# Patient Record
Sex: Male | Born: 1955
Health system: Southern US, Community
[De-identification: ages and names within clinical notes are randomized; demographics above are authoritative.]

## PROBLEM LIST (undated history)

## (undated) DIAGNOSIS — G4730 Sleep apnea, unspecified: Secondary | ICD-10-CM

## (undated) DIAGNOSIS — I10 Essential (primary) hypertension: Secondary | ICD-10-CM

## (undated) DIAGNOSIS — Z951 Presence of aortocoronary bypass graft: Secondary | ICD-10-CM

## (undated) DIAGNOSIS — E669 Obesity, unspecified: Secondary | ICD-10-CM

## (undated) DIAGNOSIS — N200 Calculus of kidney: Secondary | ICD-10-CM

## (undated) DIAGNOSIS — C801 Malignant (primary) neoplasm, unspecified: Secondary | ICD-10-CM

## (undated) DIAGNOSIS — I2510 Atherosclerotic heart disease of native coronary artery without angina pectoris: Secondary | ICD-10-CM

## (undated) DIAGNOSIS — M109 Gout, unspecified: Secondary | ICD-10-CM

## (undated) DIAGNOSIS — I499 Cardiac arrhythmia, unspecified: Secondary | ICD-10-CM

## (undated) DIAGNOSIS — Z87442 Personal history of urinary calculi: Secondary | ICD-10-CM

## (undated) DIAGNOSIS — I480 Paroxysmal atrial fibrillation: Secondary | ICD-10-CM

## (undated) DIAGNOSIS — M1990 Unspecified osteoarthritis, unspecified site: Secondary | ICD-10-CM

## (undated) HISTORY — DX: Calculus of kidney: N20.0

## (undated) HISTORY — DX: Obesity, unspecified: E66.9

## (undated) HISTORY — PX: LESION EXCISION: SHX5167

## (undated) HISTORY — PX: EYE SURGERY: SHX253

## (undated) HISTORY — PX: TONSILLECTOMY: SUR1361

## (undated) HISTORY — PX: KIDNEY STONE SURGERY: SHX686

---

## 2004-11-03 ENCOUNTER — Ambulatory Visit (HOSPITAL_BASED_OUTPATIENT_CLINIC_OR_DEPARTMENT_OTHER): Admission: RE | Admit: 2004-11-03 | Discharge: 2004-11-03 | Payer: Self-pay | Admitting: Family Medicine

## 2004-11-06 ENCOUNTER — Ambulatory Visit: Payer: Self-pay | Admitting: Internal Medicine

## 2011-12-14 ENCOUNTER — Other Ambulatory Visit: Payer: Self-pay | Admitting: Internal Medicine

## 2011-12-14 DIAGNOSIS — N200 Calculus of kidney: Secondary | ICD-10-CM

## 2011-12-15 ENCOUNTER — Ambulatory Visit
Admission: RE | Admit: 2011-12-15 | Discharge: 2011-12-15 | Disposition: A | Discharge status: Home / Self Care | Payer: BC Managed Care – PPO | Source: Ambulatory Visit | Attending: Internal Medicine | Admitting: Internal Medicine

## 2011-12-15 DIAGNOSIS — N200 Calculus of kidney: Secondary | ICD-10-CM

## 2013-01-09 DIAGNOSIS — I2510 Atherosclerotic heart disease of native coronary artery without angina pectoris: Secondary | ICD-10-CM

## 2013-01-09 HISTORY — DX: Atherosclerotic heart disease of native coronary artery without angina pectoris: I25.10

## 2013-04-15 ENCOUNTER — Other Ambulatory Visit: Payer: Self-pay | Admitting: Cardiology

## 2013-04-15 ENCOUNTER — Encounter: Payer: Self-pay | Admitting: Cardiology

## 2013-04-15 ENCOUNTER — Ambulatory Visit
Admission: RE | Admit: 2013-04-15 | Discharge: 2013-04-15 | Disposition: A | Discharge status: Home / Self Care | Payer: BC Managed Care – PPO | Source: Ambulatory Visit | Attending: Cardiology | Admitting: Cardiology

## 2013-04-15 DIAGNOSIS — E669 Obesity, unspecified: Secondary | ICD-10-CM

## 2013-04-15 DIAGNOSIS — R079 Chest pain, unspecified: Secondary | ICD-10-CM

## 2013-04-15 DIAGNOSIS — I2510 Atherosclerotic heart disease of native coronary artery without angina pectoris: Secondary | ICD-10-CM

## 2013-04-15 DIAGNOSIS — R0789 Other chest pain: Secondary | ICD-10-CM

## 2013-04-15 DIAGNOSIS — I10 Essential (primary) hypertension: Secondary | ICD-10-CM

## 2013-04-15 NOTE — H&P (Signed)
Gary [name] F  Date of visit:  04/15/2013 DOB:  [birth year]    Age:  58 yrs. Medical record number:  [id number]     Account number:  [id number] Primary Care Provider: Merrilee Seashore ____________________________ CURRENT DIAGNOSES  1. Chest Pain  2. Abnormal Exercise Stress Test  3. Obesity(BMI30-40)  4. Hypertension,Essential (Benign) ____________________________ ALLERGIES  IVP Dye, Iodine Containing, Hives and/or rash ____________________________ MEDICATIONS  1. hydrocodone 5 mg-acetaminophen 325 mg tablet, PRN  2. Vitamin C 1,000 mg tablet, 1 p.o. daily  3. multivitamin tablet, 1 p.o. daily  4. Aleve 220 mg tablet, PRN  5. allopurinol 100 mg tablet, 1 p.o. daily  6. Flomax 0.4 mg capsule, 1 p.o. daily  7. Avapro 150 mg tablet, 1 p.o. daily  8. coenzyme q-10 (ubiquinol) 200 mg capsule, 1 p.o. daily  9. magnesium 250 mg tablet, 1 p.o. daily  10. promethazine 6.25 mg/5 mL syrup, QHS  11. fluticasone 50 mcg/actuation nasal spray,suspension, qd  12. prednisone 20 mg tablet, take 3 po at 6 pm and the 3 po prior to coming to the hospital  13. metoprolol tartrate 25 mg tablet, BID  14. nitroglycerin 0.4 mg sublingual tablet, Take as directed ____________________________ CHIEF COMPLAINTS  Chest pressure with walking in january ____________________________ HISTORY OF PRESENT ILLNESS This 59 year old male is seen for evaluation of chest pressure. While at a trade show meeting he experienced the onset of midsternal discomfort associated with shortness of breath after exertion that occurred on several occasions while he was walking a pills or long distances. He would have to stop with relief of symptoms. He had a similar episode after he returned to town and later then developed "the flu" described as upper respiratory congestion, malaise and fatigue. He was seen recently by his primary physician who wanted him to have an exercise treadmill. He said was seen at the office today and walked  less than 6 minutes and the test was stopped due to severe dyspnea. He had a blunted heart rate response to exercise and a blunted blood pressure response to exercise and had 1-2 mm of ST depression in inferolateral leads that resolved in recovery. He did not have clinical anginal the treadmill today but had a markedly abnormal response to exercise. He was due to take a trip to the [facility] and after further consideration in view of the markedly abnormal exercise test and low-level positive test elected to proceed with catheterization at an earlier date. He does not have symptoms at rest. He has been obese for several years. He denies PND, orthopnea or edema. His blood pressure has been under fair control. He does have a history of allergy to contrast when he had a CT scan several years ago that evidently was self-limited and resolved with treatment. ____________________________ PAST HISTORY  Past Medical Illnesses:  hypertension, non alcololic fatty liver disease, gout, kidney stones, obesity;  Cardiovascular Illnesses:  no previous history of cardiac disease.;  Surgical Procedures:  kidney stone removal;  Cardiology Procedures-Invasive:  no history of prior cardiac procedures;  Cardiology Procedures-Noninvasive:  no previous non-invasive procedures;  LVEF not documented,   ____________________________ CARDIO-PULMONARY TEST DATES EKG Date:  04/15/2013;   ____________________________ FAMILY HISTORY Brother -- Brother alive and well Brother -- Brother dead, Malignant melanoma Father -- Father dead, Myocardial infarction at less than 17, Carcinoma of the pancreas Mother -- Mother alive and well ____________________________ SOCIAL HISTORY Alcohol Use:  occasionally;  Smoking:  used to smoke but quit, 25 pack year history;  Diet:  regular diet;  Lifestyle:  married;  Exercise:  no regular exercise;  Occupation:  Press photographer;  Residence:  lives with wife;   ____________________________ REVIEW OF  SYSTEMS General:  obesity  Integumentary:no rashes or new skin lesions. Eyes: dry eyes Ears, Nose, Throat, Mouth:  denies any hearing loss, epistaxis, hoarseness or difficulty speaking. Respiratory: dyspnea with exertion Cardiovascular:  please review HPI Abdominal: denies dyspepsia, GI bleeding, constipation, or diarrhea Genitourinary-Male: no dysuria, urgency, frequency, or nocturia  Musculoskeletal:  denies arthritis, venous insufficiency, or muscle weakness Neurological:  denies headaches, stroke, or TIA Psychiatric:  denies depession or anxiety  ____________________________ PHYSICAL EXAMINATION VITAL SIGNS  Blood Pressure:  118/74 Supine, Left arm, regular cuff  , 110/70 Standing, Left arm and regular cuff   Pulse:  76/min. Weight:  287.00 lbs. Height:  74"BMI: 37  Constitutional:  pleasant white male in no acute distress, moderately obese Skin:  warm and dry to touch, no apparent skin lesions, or masses noted. Head:  normocephalic, normal hair pattern, no masses or tenderness Eyes:  EOMS Intact, PERRLA, C and S clear, Funduscopic exam not done. ENT:  ears, nose and throat reveal no gross abnormalities.  Dentition good. Neck:  supple, without massess. No JVD, thyromegaly or carotid bruits. Carotid upstroke normal. Chest:  normal symmetry, clear to auscultation. Cardiac:  regular rhythm, normal S1 and S2, No S3 or S4, no murmurs, gallops or rubs detected. Abdomen:  abdomen soft,non-tender, no masses, no hepatospenomegaly, or aneurysm noted Peripheral Pulses:  the femoral,dorsalis pedis, and posterior tibial pulses are full and equal bilaterally with no bruits auscultated. Extremities & Back:  no deformities, clubbing, cyanosis, erythema or edema observed. Normal muscle strength and tone. Neurological:  no gross motor or sensory deficits noted, affect appropriate, oriented x3. ____________________________ IMPRESSIONS/PLAN  1. Chest pressure suggestive of angina with a markedly abnormal  stress test at a low level of exercise with a blunted heart rate response and blood pressure and as well as ST depression 2. Hypertension 3. Family history of cardiac disease 4. Obesity  Recommendations:  Cardiac catheterization was discussed with the patient including risks of myocardial infarction, death, stroke, bleeding, arrhythmia, dye allergy, or renal insufficiency. He understands and is willing to proceed. Possibility of percutaneous intervention at the same setting was also discussed with the patient including risks. He does have a previous allergy to contrast and will be premedicated with steroids, Benadryl and H2 blockers. He is agreeable and wishes to proceed. He was advised to take aspirin 81 mg daily and started on metoprolol 25 mg twice daily as well as given a prescription for nitroglycerin. ____________________________ TODAYS ORDERS  1. Comprehensive Metabolic Panel: Today  2. Complete Blood Count: Today  3. Draw PT/INR: Today  4. PTT: Today  5. Chest X-ray PA/Lat: today                       ____________________________ Cardiology Physician:  Kerry Hough MD [facility]

## 2013-04-16 ENCOUNTER — Encounter (HOSPITAL_COMMUNITY)
Admission: RE | Disposition: A | Discharge status: Home / Self Care | Payer: Self-pay | Source: Ambulatory Visit | Attending: Cardiothoracic Surgery

## 2013-04-16 ENCOUNTER — Ambulatory Visit (HOSPITAL_COMMUNITY)
Admission: RE | Admit: 2013-04-16 | Discharge: 2013-04-16 | Disposition: A | Discharge status: Home / Self Care | Payer: BC Managed Care – PPO | Source: Ambulatory Visit | Attending: Cardiology | Admitting: Cardiology

## 2013-04-16 ENCOUNTER — Other Ambulatory Visit: Payer: Self-pay | Admitting: *Deleted

## 2013-04-16 ENCOUNTER — Encounter (HOSPITAL_COMMUNITY)
Admission: RE | Disposition: A | Discharge status: Home / Self Care | Payer: Self-pay | Source: Ambulatory Visit | Attending: Cardiology

## 2013-04-16 ENCOUNTER — Inpatient Hospital Stay (HOSPITAL_COMMUNITY)
Admission: RE | Admit: 2013-04-16 | Discharge: 2013-04-24 | DRG: 234 | Disposition: A | Discharge status: Home / Self Care | Payer: BC Managed Care – PPO | Source: Ambulatory Visit | Attending: Cardiothoracic Surgery | Admitting: Cardiothoracic Surgery

## 2013-04-16 ENCOUNTER — Other Ambulatory Visit: Payer: Self-pay | Admitting: Cardiology

## 2013-04-16 ENCOUNTER — Encounter (HOSPITAL_COMMUNITY): Payer: Self-pay | Admitting: Cardiology

## 2013-04-16 DIAGNOSIS — I2510 Atherosclerotic heart disease of native coronary artery without angina pectoris: Secondary | ICD-10-CM

## 2013-04-16 DIAGNOSIS — Z951 Presence of aortocoronary bypass graft: Secondary | ICD-10-CM

## 2013-04-16 DIAGNOSIS — R9439 Abnormal result of other cardiovascular function study: Secondary | ICD-10-CM | POA: Diagnosis present

## 2013-04-16 DIAGNOSIS — R9430 Abnormal result of cardiovascular function study, unspecified: Secondary | ICD-10-CM

## 2013-04-16 DIAGNOSIS — R079 Chest pain, unspecified: Secondary | ICD-10-CM

## 2013-04-16 DIAGNOSIS — N200 Calculus of kidney: Secondary | ICD-10-CM | POA: Insufficient documentation

## 2013-04-16 DIAGNOSIS — E669 Obesity, unspecified: Secondary | ICD-10-CM

## 2013-04-16 DIAGNOSIS — K7689 Other specified diseases of liver: Secondary | ICD-10-CM | POA: Diagnosis present

## 2013-04-16 DIAGNOSIS — Z8249 Family history of ischemic heart disease and other diseases of the circulatory system: Secondary | ICD-10-CM

## 2013-04-16 DIAGNOSIS — Z01810 Encounter for preprocedural cardiovascular examination: Secondary | ICD-10-CM

## 2013-04-16 DIAGNOSIS — M109 Gout, unspecified: Secondary | ICD-10-CM | POA: Insufficient documentation

## 2013-04-16 DIAGNOSIS — D62 Acute posthemorrhagic anemia: Secondary | ICD-10-CM | POA: Diagnosis not present

## 2013-04-16 DIAGNOSIS — I208 Other forms of angina pectoris: Secondary | ICD-10-CM

## 2013-04-16 DIAGNOSIS — Z79899 Other long term (current) drug therapy: Secondary | ICD-10-CM

## 2013-04-16 DIAGNOSIS — Z808 Family history of malignant neoplasm of other organs or systems: Secondary | ICD-10-CM

## 2013-04-16 DIAGNOSIS — I4891 Unspecified atrial fibrillation: Secondary | ICD-10-CM | POA: Diagnosis not present

## 2013-04-16 DIAGNOSIS — Z87891 Personal history of nicotine dependence: Secondary | ICD-10-CM

## 2013-04-16 DIAGNOSIS — I10 Essential (primary) hypertension: Secondary | ICD-10-CM | POA: Diagnosis present

## 2013-04-16 DIAGNOSIS — I200 Unstable angina: Secondary | ICD-10-CM | POA: Diagnosis present

## 2013-04-16 DIAGNOSIS — E8779 Other fluid overload: Secondary | ICD-10-CM | POA: Diagnosis not present

## 2013-04-16 DIAGNOSIS — Z800 Family history of malignant neoplasm of digestive organs: Secondary | ICD-10-CM

## 2013-04-16 DIAGNOSIS — Z7982 Long term (current) use of aspirin: Secondary | ICD-10-CM

## 2013-04-16 DIAGNOSIS — Z91041 Radiographic dye allergy status: Secondary | ICD-10-CM

## 2013-04-16 DIAGNOSIS — Z6837 Body mass index (BMI) 37.0-37.9, adult: Secondary | ICD-10-CM

## 2013-04-16 DIAGNOSIS — I480 Paroxysmal atrial fibrillation: Secondary | ICD-10-CM | POA: Diagnosis not present

## 2013-04-16 DIAGNOSIS — I2582 Chronic total occlusion of coronary artery: Secondary | ICD-10-CM | POA: Diagnosis present

## 2013-04-16 HISTORY — DX: Essential (primary) hypertension: I10

## 2013-04-16 HISTORY — DX: Gout, unspecified: M10.9

## 2013-04-16 HISTORY — PX: LEFT AND RIGHT HEART CATHETERIZATION WITH CORONARY ANGIOGRAM: SHX5449

## 2013-04-16 HISTORY — DX: Atherosclerotic heart disease of native coronary artery without angina pectoris: I25.10

## 2013-04-16 LAB — SPIROMETRY WITH GRAPH
FEF 25-75 Post: 4.16 L/sec
FEF 25-75 Pre: 3.1 L/sec
FEF2575-%Change-Post: 34 %
FEF2575-%Pred-Post: 117 %
FEF2575-%Pred-Pre: 87 %
FEV1-%Change-Post: 7 %
FEV1-%Pred-Post: 94 %
FEV1-%Pred-Pre: 87 %
FEV1-Post: 4.02 L
FEV1-Pre: 3.75 L
FEV1FVC-%Change-Post: 8 %
FEV1FVC-%Pred-Pre: 100 %
FEV6-%Change-Post: 0 %
FEV6-%Pred-Post: 90 %
FEV6-%Pred-Pre: 90 %
FEV6-Post: 4.85 L
FEV6-Pre: 4.87 L
FEV6FVC-%Change-Post: 0 %
FEV6FVC-%Pred-Post: 104 %
FEV6FVC-%Pred-Pre: 103 %
FVC-%Change-Post: -1 %
FVC-%Pred-Post: 86 %
FVC-%Pred-Pre: 88 %
FVC-Post: 4.85 L
FVC-Pre: 4.91 L
Post FEV1/FVC ratio: 83 %
Post FEV6/FVC ratio: 100 %
Pre FEV1/FVC ratio: 76 %
Pre FEV6/FVC Ratio: 99 %

## 2013-04-16 LAB — CBC
HCT: 41.5 % (ref 39.0–52.0)
HEMOGLOBIN: 14.5 g/dL (ref 13.0–17.0)
MCH: 33 pg (ref 26.0–34.0)
MCHC: 34.9 g/dL (ref 30.0–36.0)
MCV: 94.3 fL (ref 78.0–100.0)
PLATELETS: 296 10*3/uL (ref 150–400)
RBC: 4.4 MIL/uL (ref 4.22–5.81)
RDW: 12.8 % (ref 11.5–15.5)
WBC: 15.5 10*3/uL — ABNORMAL HIGH (ref 4.0–10.5)

## 2013-04-16 LAB — CREATININE, SERUM
Creatinine, Ser: 1.06 mg/dL (ref 0.50–1.35)
GFR calc Af Amer: 89 mL/min — ABNORMAL LOW (ref >90)
GFR calc non Af Amer: 77 mL/min — ABNORMAL LOW (ref >90)

## 2013-04-16 SURGERY — LEFT HEART CATHETERIZATION WITH CORONARY ANGIOGRAM
Anesthesia: LOCAL

## 2013-04-16 SURGERY — LEFT AND RIGHT HEART CATHETERIZATION WITH CORONARY ANGIOGRAM
Anesthesia: LOCAL

## 2013-04-16 MED ORDER — SODIUM CHLORIDE 0.9 % IV SOLN
1.0000 mL/kg/h | INTRAVENOUS | Status: AC
  Administered 2013-04-16: 1 mL/kg/h via INTRAVENOUS

## 2013-04-16 MED ORDER — ASPIRIN EC 81 MG PO TBEC
81.0000 mg | DELAYED_RELEASE_TABLET | Freq: Every day | ORAL | Status: DC
  Administered 2013-04-17: 81 mg via ORAL
  Filled 2013-04-16 (×3): qty 1

## 2013-04-16 MED ORDER — MIDAZOLAM HCL 2 MG/2ML IJ SOLN
INTRAMUSCULAR | Status: AC
  Filled 2013-04-16: qty 2

## 2013-04-16 MED ORDER — HEPARIN (PORCINE) IN NACL 2-0.9 UNIT/ML-% IJ SOLN
INTRAMUSCULAR | Status: AC
  Filled 2013-04-16: qty 1000

## 2013-04-16 MED ORDER — IRBESARTAN 150 MG PO TABS
150.0000 mg | ORAL_TABLET | Freq: Every day | ORAL | Status: DC
  Administered 2013-04-17: 150 mg via ORAL
  Filled 2013-04-16 (×3): qty 1

## 2013-04-16 MED ORDER — SODIUM CHLORIDE 0.9 % IV SOLN: 250.0000 mL | INTRAVENOUS | Status: DC | PRN

## 2013-04-16 MED ORDER — ASPIRIN 81 MG PO CHEW
81.0000 mg | CHEWABLE_TABLET | ORAL | Status: DC
  Filled 2013-04-16: qty 1

## 2013-04-16 MED ORDER — DIPHENHYDRAMINE HCL 50 MG/ML IJ SOLN
25.0000 mg | INTRAMUSCULAR | Status: AC
  Administered 2013-04-16: 25 mg via INTRAVENOUS

## 2013-04-16 MED ORDER — ENOXAPARIN SODIUM 40 MG/0.4ML ~~LOC~~ SOLN
40.0000 mg | SUBCUTANEOUS | Status: DC
  Administered 2013-04-17: 40 mg via SUBCUTANEOUS
  Filled 2013-04-16 (×3): qty 0.4

## 2013-04-16 MED ORDER — DIPHENHYDRAMINE HCL 50 MG/ML IJ SOLN
INTRAMUSCULAR | Status: AC
  Administered 2013-04-16: 25 mg via INTRAVENOUS
  Filled 2013-04-16: qty 1

## 2013-04-16 MED ORDER — NITROGLYCERIN 0.2 MG/ML ON CALL CATH LAB
INTRAVENOUS | Status: AC
  Filled 2013-04-16: qty 1

## 2013-04-16 MED ORDER — DIPHENHYDRAMINE HCL 50 MG/ML IJ SOLN
25.0000 mg | INTRAMUSCULAR | Status: AC
  Administered 2013-04-16: 25 mg via INTRAVENOUS
  Filled 2013-04-16: qty 1

## 2013-04-16 MED ORDER — ALLOPURINOL 300 MG PO TABS
400.0000 mg | ORAL_TABLET | Freq: Every day | ORAL | Status: DC
  Administered 2013-04-17 – 2013-04-24 (×7): 400 mg via ORAL
  Filled 2013-04-16 (×9): qty 1

## 2013-04-16 MED ORDER — SODIUM CHLORIDE 0.9 % IJ SOLN
3.0000 mL | Freq: Two times a day (BID) | INTRAMUSCULAR | Status: DC
  Administered 2013-04-16 – 2013-04-17 (×3): 3 mL via INTRAVENOUS

## 2013-04-16 MED ORDER — FAMOTIDINE IN NACL 20-0.9 MG/50ML-% IV SOLN
20.0000 mg | INTRAVENOUS | Status: AC
  Administered 2013-04-16: 20 mg via INTRAVENOUS
  Filled 2013-04-16 (×3): qty 50

## 2013-04-16 MED ORDER — SODIUM CHLORIDE 0.9 % IV SOLN
INTRAVENOUS | Status: DC
Start: 2013-04-16 — End: 2013-04-18
  Administered 2013-04-16: 08:00:00 via INTRAVENOUS

## 2013-04-16 MED ORDER — SODIUM CHLORIDE 0.9 % IV SOLN
INTRAVENOUS | Status: DC
  Administered 2013-04-16: 08:00:00 via INTRAVENOUS

## 2013-04-16 MED ORDER — HEPARIN (PORCINE) IN NACL 2-0.9 UNIT/ML-% IJ SOLN
INTRAMUSCULAR | Status: AC
  Filled 2013-04-16: qty 500

## 2013-04-16 MED ORDER — FAMOTIDINE 20 MG PO TABS
20.0000 mg | ORAL_TABLET | ORAL | Status: AC
  Administered 2013-04-16: 20 mg via ORAL

## 2013-04-16 MED ORDER — LIDOCAINE HCL (PF) 1 % IJ SOLN
INTRAMUSCULAR | Status: AC
  Filled 2013-04-16: qty 30

## 2013-04-16 MED ORDER — TAMSULOSIN HCL 0.4 MG PO CAPS
0.4000 mg | ORAL_CAPSULE | Freq: Every day | ORAL | Status: DC
  Administered 2013-04-17 – 2013-04-24 (×7): 0.4 mg via ORAL
  Filled 2013-04-16 (×9): qty 1

## 2013-04-16 MED ORDER — SODIUM CHLORIDE 0.9 % IJ SOLN: 3.0000 mL | INTRAMUSCULAR | Status: DC | PRN

## 2013-04-16 MED ORDER — METOPROLOL TARTRATE 25 MG PO TABS
25.0000 mg | ORAL_TABLET | Freq: Two times a day (BID) | ORAL | Status: DC
  Administered 2013-04-16 – 2013-04-17 (×3): 25 mg via ORAL
  Filled 2013-04-16 (×7): qty 1

## 2013-04-16 MED ORDER — DIAZEPAM 5 MG PO TABS
10.0000 mg | ORAL_TABLET | ORAL | Status: AC
  Administered 2013-04-16: 10 mg via ORAL
  Filled 2013-04-16: qty 2

## 2013-04-16 MED ORDER — ALLOPURINOL 300 MG PO TABS: 400.0000 mg | ORAL_TABLET | Freq: Every day | ORAL | Status: DC

## 2013-04-16 MED ORDER — ASPIRIN 81 MG PO CHEW: 81.0000 mg | CHEWABLE_TABLET | ORAL | Status: DC

## 2013-04-16 MED ORDER — VITAMIN C 500 MG PO TABS
2000.0000 mg | ORAL_TABLET | Freq: Every day | ORAL | Status: DC
  Administered 2013-04-17 – 2013-04-24 (×7): 2000 mg via ORAL
  Filled 2013-04-16 (×9): qty 4

## 2013-04-16 MED ORDER — DIAZEPAM 5 MG PO TABS
10.0000 mg | ORAL_TABLET | ORAL | Status: AC
Start: 2013-04-16 — End: 2013-04-16
  Administered 2013-04-16: 10 mg via ORAL

## 2013-04-16 NOTE — CV Procedure (Addendum)
Cardiac Catheterization Report   Gary [name]    58 y.o.  male  DOB: 28-Jun-1955  MRN: [id number]  04/16/2013    PROCEDURE:  Left heart catheterization with selective coronary angiography, left ventriculogram.  INDICATIONS:  Angina pectoris with a high risk treadmill test with failure to raise blood pressure and poor chronotropic response to exercise  The risks, benefits, and details of the procedure were explained to the patient.  The patient verbalized understanding and wanted to proceed.  Informed written consent was obtained.  PROCEDURE TECHNIQUE:  After Xylocaine anesthesia a 21F sheath was placed in the right femoral artery with a single anterior needle wall stick.  Versed 2 mg IV was used for sedation area Left coronary angiography was done using a Judkins L 5 guide catheter.  Right coronary angiography was done using a Judkins R4 guide catheter.  A 30 cc ventriculogram was performed in the 30 degree RAO projection.  Tolerated the procedure well.  Sheath removed in the holding area.   CONTRAST:  Total of 95 cc.  COMPLICATIONS:  None.    HEMODYNAMICS:  Aortic postcontrast 105/63, left ventricle postcontrast 105/5-13 area  There was no gradient between the left ventricle and aorta.    ANGIOGRAPHIC DATA:    CORONARY ARTERIES:   Arise and distribute normally.  Co- dominant. Moderate coronary calcification is noted.  Left main coronary artery: Normal  Left anterior descending: Calcified proximally, first diagonal branch is totally occluded and fills by collaterals from the circumflex. There is a total occlusion of the LAD distally with  bridging collaterals as well as collaterals from the right coronary artery  Circumflex coronary artery: Large codominant vessel. Severe eccentric 99% stenosis in the proximal circumflex followed by an eccentric tubular narrowing involving the first marginal branch which is large. There is then some haziness or  possible thrombus or competitive flow through atrial branch in the midportion. There is also collateral filling of the circumflex from the right coronary artery.  Right coronary artery: Severe stenosis involving a large acute marginal branch that supplies collaterals to the distal LAD and some collateral filling seen of the circumflex also.  LEFT VENTRICULOGRAM:  Performed in the 30 RAO projection.  The aortic and mitral valves are normal. The left ventricle is normal in size with normal wall motion. Estimated ejection fraction is 65%.  IMPRESSIONS:  1. Significant three-vessel coronary atherosclerosis 2. Normal left ventricle.Marland Kitchen  RECOMMENDATION:  Surgical consultation initially. Theoretically he could have PCI of the dominant circumflex and the right coronary artery but if the distal LAD could be bypassed this might be better for CABG.  Kerry Hough MD [facility]

## 2013-04-16 NOTE — Progress Notes (Addendum)
VASCULAR LAB PRELIMINARY  PRELIMINARY  PRELIMINARY  PRELIMINARY  Pre-op Cardiac Surgery  Carotid Findings:  Bilateral:  1-39% ICA stenosis.  Vertebral artery flow is antegrade.      Ralene Cork, RVT 04/16/2013 2:16 PM    Upper Extremity Right Left  Brachial Pressures 102 100  Radial Waveforms triphasic triphasic  Ulnar Waveforms triphasic triphasic  Palmar Arch (Allen's Test) normal normal   Findings:  Normal upper extremity arterial exam    Lower  Extremity Right Left  Dorsalis Pedis    Anterior Tibial    Posterior Tibial    Ankle/Brachial Indices      Findings:  Palpable pedal pulses

## 2013-04-16 NOTE — Interval H&P Note (Signed)
History and Physical Interval Note:  04/16/2013 8:42 AM   Patient seen and examined.  No interval change in history and exam since last note.  Stable for procedure.  Kerry Hough. MD [facility]  04/16/2013

## 2013-04-16 NOTE — H&P (Signed)
  Gary [name], Gary [name]  Date of visit:  04/15/2013 DOB:  07/05/1955    Age:  58 yrs. Medical record number:  [id number]     Account number:  [id number] Primary Care Provider: RAMACHANDRAN, AJITH ____________________________ CURRENT DIAGNOSES  1. Chest Pain  2. Abnormal Exercise Stress Test  3. Obesity(BMI30-40)  4. Hypertension,Essential (Benign) ____________________________ ALLERGIES  IVP Dye, Iodine Containing, Hives and/or rash ____________________________ MEDICATIONS  1. hydrocodone 5 mg-acetaminophen 325 mg tablet, PRN  2. Vitamin C 1,000 mg tablet, 1 p.o. daily  3. multivitamin tablet, 1 p.o. daily  4. Aleve 220 mg tablet, PRN  5. allopurinol 100 mg tablet, 1 p.o. daily  6. Flomax 0.4 mg capsule, 1 p.o. daily  7. Avapro 150 mg tablet, 1 p.o. daily  8. coenzyme q-10 (ubiquinol) 200 mg capsule, 1 p.o. daily  9. magnesium 250 mg tablet, 1 p.o. daily  10. promethazine 6.25 mg/5 mL syrup, QHS  11. fluticasone 50 mcg/actuation nasal spray,suspension, qd  12. prednisone 20 mg tablet, take 3 po at 6 pm and the 3 po prior to coming to the hospital  13. metoprolol tartrate 25 mg tablet, BID  14. nitroglycerin 0.4 mg sublingual tablet, Take as directed ____________________________ CHIEF COMPLAINTS  Chest pressure with walking in january ____________________________ HISTORY OF PRESENT ILLNESS This 58-year-old male is seen for evaluation of chest pressure. While at a trade show meeting he experienced the onset of midsternal discomfort associated with shortness of breath after exertion that occurred on several occasions while he was walking a pills or long distances. He would have to stop with relief of symptoms. He had a similar episode after he returned to town and later then developed "the flu" described as upper respiratory congestion, malaise and fatigue. He was seen recently by his primary physician who wanted him to have an exercise treadmill. He said was seen at the office today and walked  less than 6 minutes and the test was stopped due to severe dyspnea. He had a blunted heart rate response to exercise and a blunted blood pressure response to exercise and had 1-2 mm of ST depression in inferolateral leads that resolved in recovery. He did not have clinical anginal the treadmill today but had a markedly abnormal response to exercise. [place] after further consideration in view of the markedly abnormal exercise test and low-level positive test elected to proceed with catheterization at an earlier date. He does not have symptoms at rest. He has been obese for several years. He denies PND, orthopnea or edema. His blood pressure has been under fair control. He does have a history of allergy to contrast when he had a CT scan several years ago that evidently was self-limited and resolved with treatment. ____________________________ PAST HISTORY  Past Medical Illnesses:  hypertension, non alcololic fatty liver disease, gout, kidney stones, obesity;  Cardiovascular Illnesses:  no previous history of cardiac disease.;  Surgical Procedures:  kidney stone removal;  Cardiology Procedures-Invasive:  no history of prior cardiac procedures;  Cardiology Procedures-Noninvasive:  no previous non-invasive procedures;  LVEF not documented,   ____________________________ CARDIO-PULMONARY TEST DATES EKG Date:  04/15/2013;   ____________________________ FAMILY HISTORY Brother -- Brother alive and well Brother -- Brother dead, Malignant melanoma Father -- Father dead, Myocardial infarction at less than 60, Carcinoma of the pancreas Mother -- Mother alive and well ____________________________ SOCIAL HISTORY Alcohol Use:  occasionally;  Smoking:  used to smoke but quit, 25 pack year history;    Diet:  regular diet;  Lifestyle:  married;  Exercise:  no regular exercise;  Occupation:  sales;  Residence:  lives with wife;   ____________________________ REVIEW OF  SYSTEMS General:  obesity  Integumentary:no rashes or new skin lesions. Eyes: dry eyes Ears, Nose, Throat, Mouth:  denies any hearing loss, epistaxis, hoarseness or difficulty speaking. Respiratory: dyspnea with exertion Cardiovascular:  please review HPI Abdominal: denies dyspepsia, GI bleeding, constipation, or diarrhea Genitourinary-Male: no dysuria, urgency, frequency, or nocturia  Musculoskeletal:  denies arthritis, venous insufficiency, or muscle weakness Neurological:  denies headaches, stroke, or TIA Psychiatric:  denies depession or anxiety  ____________________________ PHYSICAL EXAMINATION VITAL SIGNS  Blood Pressure:  118/74 Supine, Left arm, regular cuff  , 110/70 Standing, Left arm and regular cuff   Pulse:  76/min. Weight:  287.00 lbs. Height:  74"BMI: 37  Constitutional:  pleasant white male in no acute distress, moderately obese Skin:  warm and dry to touch, no apparent skin lesions, or masses noted. Head:  normocephalic, normal hair pattern, no masses or tenderness Eyes:  EOMS Intact, PERRLA, C and S clear, Funduscopic exam not done. ENT:  ears, nose and throat reveal no gross abnormalities.  Dentition good. Neck:  supple, without massess. No JVD, thyromegaly or carotid bruits. Carotid upstroke normal. Chest:  normal symmetry, clear to auscultation. Cardiac:  regular rhythm, normal S1 and S2, No S3 or S4, no murmurs, gallops or rubs detected. Abdomen:  abdomen soft,non-tender, no masses, no hepatospenomegaly, or aneurysm noted Peripheral Pulses:  the femoral,dorsalis pedis, and posterior tibial pulses are full and equal bilaterally with no bruits auscultated. Extremities & Back:  no deformities, clubbing, cyanosis, erythema or edema observed. Normal muscle strength and tone. Neurological:  no gross motor or sensory deficits noted, affect appropriate, oriented x3. ____________________________ IMPRESSIONS/PLAN  1. Chest pressure suggestive of angina with a markedly abnormal  stress test at a low level of exercise with a blunted heart rate response and blood pressure and as well as ST depression 2. Hypertension 3. Family history of cardiac disease 4. Obesity  Recommendations:  Cardiac catheterization was discussed with the patient including risks of myocardial infarction, death, stroke, bleeding, arrhythmia, dye allergy, or renal insufficiency. He understands and is willing to proceed. Possibility of percutaneous intervention at the same setting was also discussed with the patient including risks. He does have a previous allergy to contrast and will be premedicated with steroids, Benadryl and H2 blockers. He is agreeable and wishes to proceed. He was advised to take aspirin 81 mg daily and started on metoprolol 25 mg twice daily as well as given a prescription for nitroglycerin. ____________________________ TODAYS ORDERS  1. Comprehensive Metabolic Panel: Today  2. Complete Blood Count: Today  3. Draw PT/INR: Today  4. PTT: Today  5. Chest X-ray PA/Lat: today                       ____________________________ Cardiology Physician:  W. Spencer Lynsey Ange, Jr. MD [facility]    

## 2013-04-16 NOTE — Consult Note (Signed)
[address]       [place] [address]             [phone]        Suhayb Spurgin Wheeler Medical Record #[id number] Date of Birth: September 04, 1955  Referring: Dr Wynonia Lawman Primary Care: Merrilee Seashore, MD  Chief Complaint:   Chest Pain with positive treadmill   History of Present Illness:     Patient is  58 year old male is seen by his internist for Flu symptoms . While being seen he related three episodes of exertional chest pain in January. While at a trade show meeting he experienced the onset of midsternal discomfort associated with shortness of breath after exertion that occurred on several occasions while he was walking up slight hills. He would have to stop with relief of symptoms. He had a similar episode after he returned to town and later then developed "the flu" described as upper respiratory congestion, malaise and fatigue. He was seen recently by his primary physician who wanted him to have an exercise treadmill. He was refreered to cardiology yesterday.  He walked less than 6 minutes on treadmill and the test was stopped due to severe dyspnea. He had a blunted heart rate response to exercise and a blunted blood pressure response to exercise and had 1-2 mm of ST depression in inferolateral leads that resolved in recovery. He did not have clinical anginal the treadmill today but had a markedly abnormal response to exercise. He was due to take a trip to the [facility]. Due to stress test findings he had cardiac cath today. He does not have symptoms at rest. Risk factors for cad include obesity and postive history in his father of CAD before age 33. He denies PND, orthopnea or edema. His blood pressure has been under fair control. He does have a history of allergy to contrast when he had a CT scan several years ago that evidently was self-limited and resolved with treatment. He has not been diagnosed with DM.  ____________________________    Current Activity/  Functional Status: Patient is independent with mobility/ambulation, transfers, ADL's, IADL's.   Zubrod Score: At the time of surgery this patient's most appropriate activity status/level should be described as: [x]     0    Normal activity, no symptoms except chest pain  []     1    Restricted in physical strenuous activity but ambulatory, able to do out light work []     2    Ambulatory and capable of self care, unable to do work activities, up and about                 more than 50%  Of the time                            []     3    Only limited self care, in bed greater than 50% of waking hours []     4    Completely disabled, no self care, confined to bed or chair []     5    Moribund  Past Medical History  Diagnosis Date  . Hypertension   . Kidney stones   . Obesity (BMI 30-39.9) 04/15/2013  . CAD (coronary artery disease), native coronary artery     Coronary calcifications noted on CT scan in 2013      Past Surgical History  Procedure Laterality Date  . Kidney  stone surgery      History  Smoking status  . Former Smoker -- 25 years  Smokeless tobacco  . Never Used    History  Alcohol Use No    History   Social History  . Marital Status: Married    Spouse Name:     Number of Children:   . Years of Education:    Occupational History  . Works in Press photographer, travels by Educational psychologist frequently   Social History Main Topics  . Smoking status: Former Smoker -- 25 years  . Smokeless tobacco: Never Used  . Alcohol Use: No  . Drug Use: No  . Sexual Activity: Yes     Allergies  Allergen Reactions  . Contrast Media [Iodinated Diagnostic Agents]     Current Facility-Administered Medications  Medication Dose Route Frequency Provider Last Rate Last Dose  . 0.9 %  sodium chloride infusion  1 mL/kg/hr Intravenous Continuous Jacolyn Reedy, MD 128.4 mL/hr at 04/16/13 0951 1 mL/kg/hr at 04/16/13 0272    Prescriptions prior to admission  Medication Sig Dispense Refill  .  allopurinol (ZYLOPRIM) 100 MG tablet Take 400 mg by mouth daily. Takes along with a 300mg  tablet to equal 400mg       . allopurinol (ZYLOPRIM) 300 MG tablet Take 400 mg by mouth daily. Takes along with a 100mg  tablet to equal 400mg       . Ascorbic Acid (VITAMIN C) 1000 MG tablet Take 2,000 mg by mouth daily.      Marland Kitchen aspirin EC 81 MG tablet Take 81 mg by mouth daily.      . irbesartan (AVAPRO) 150 MG tablet Take 150 mg by mouth daily.      Marland Kitchen METOPROLOL TARTRATE PO Take 1 tablet by mouth 2 (two) times daily.      . Multiple Vitamins-Minerals (CENTRUM SILVER PO) Take 1 tablet by mouth daily.      . Polyvinyl Alcohol-Povidone (REFRESH OP) Place 1 drop into both eyes 2 (two) times daily as needed (dry eyes).      . tamsulosin (FLOMAX) 0.4 MG CAPS capsule Take 0.4 mg by mouth daily.        FAMILY HISTORY  Brother -- Brother alive and well  Brother -- Brother dead, Malignant melanoma  Father -- Father dead, Myocardial infarction at less than 11 treated with stents , deceased 66 of Carcinoma of the pancreas  Mother -- Mother alive and well   Review of Systems:     Cardiac Review of Systems: Y or N  Chest Pain [ y   ]  Resting SOB [ n  ] Exertional SOB  [  y]  Orthopnea [n  ]   Pedal Edema [ n  ]    Palpitations [ n ] Syncope  [ n ]   Presyncope [n   ]  General Review of Systems: [Y] = yes [  ]=no Constitional: recent weight change [n  ]; anorexia [  ]; fatigue [ y ]; nausea [  ]; night sweats [n  ]; fever [ n ]; or chills [ n ]                                                              Dental: poor dentition[n  ]; Last Dentist visit:  Eye : blurred vision [n  ]; diplopia [   ]; vision changes [ n ];  Amaurosis fugax[n  ]; Resp: cough [  ];  wheezing[  ];  hemoptysis[ n ]; shortness of breath[  ]; paroxysmal nocturnal dyspnea[  ]; dyspnea on exertion[ y ]; or orthopnea[  ];  GI:  gallstones[  ], vomiting[  ];  dysphagia[  ]; melena[  ];  hematochezia [  ]; heartburn[  ];   Hx of  Colonoscopy[y   ]; GU: kidney stones [ y; hematuria[ n ];   dysuria [  ];  nocturia[  ];  history of     obstruction [ n ]; urinary frequency [ y and ]             Skin: rash, swelling[  ];, hair loss[  ];  peripheral edema[  ];  or itching[  ]; Musculosketetal: myalgias[  ];  joint swelling[  ];  joint erythema[  ];  joint pain[  ];  back pain[  ];  Heme/Lymph: bruising[  ];  bleeding[  ];  anemia[  ];  Neuro: TIA[n  ];  headaches[ n ];  stroke[n  ];  vertigo[  ];  seizures[  ];   paresthesias[n  ];  difficulty walking[n  ];  Psych:depression[  ]; anxiety[  ];  Endocrine: diabetes[  ];  thyroid dysfunction[  ];  Immunizations: Flu [ y ]; Pneumococcal[y  ];  Other:  Physical Exam: BP 113/77  Pulse 80  Temp(Src) 98 F (36.7 C) (Oral)  Resp 18  Ht 6\' 2"  (1.88 m)  Wt 283 lb (128.368 kg)  BMI 36.32 kg/m2  SpO2 98%  General appearance: alert, cooperative, appears stated age and moderately obese Neurologic: intact Heart: regular rate and rhythm, S1, S2 normal, no murmur, click, rub or gallop Lungs: clear to auscultation bilaterally Abdomen: soft, non-tender; bowel sounds normal; no masses,  no organomegaly Extremities: extremities normal, atraumatic, no cyanosis or edema and Homans sign is negative, no sign of DVT Wound: Right cath site dressing in place and without hematoma Patient has no carotid bruits, full and equal radial brachial popliteal DP and PT pulses Appears to have adequate vein for bypass in the lower extremities  Diagnostic Studies & Laboratory data:     Recent Radiology Findings:   Dg Chest 2 View  04/15/2013   CLINICAL DATA:  Chest pain, shortness of breath, cardiac catheterization gland  EXAM: CHEST  2 VIEW  COMPARISON:  None.  FINDINGS: Heart size and vascular pattern normal. Lungs clear. No effusion. Old left rib fractures noted.  IMPRESSION: No active cardiopulmonary disease.   Electronically Signed   By: Skipper Cliche M.D.   On: 04/15/2013 13:42      Recent Lab Findings: No  results found for this basename: WBC,  HGB,  HCT,  PLT,  GLUCOSE,  CHOL,  TRIG,  HDL,  LDLDIRECT,  LDLCALC,  ALT,  AST,  NA,  K,  CL,  CREATININE,  BUN,  CO2,  TSH,  INR,  GLUF,  HGBA1C   CATH: Marks Langolf 58 y.o. male  DOB: 06/15/1955  MRN: [id number]  04/16/2013  PROCEDURE: Left heart catheterization with selective coronary angiography, left ventriculogram.  INDICATIONS: Angina pectoris with a high risk treadmill test with failure to raise blood pressure and poor chronotropic response to exercise  The risks, benefits, and details of the procedure were explained to the patient. The patient verbalized understanding and wanted to proceed. Informed written consent was obtained.  PROCEDURE TECHNIQUE: After Xylocaine anesthesia a 44F sheath was placed in the right femoral artery with a single anterior needle wall stick. Versed 2 mg IV was used for sedation area Left coronary angiography was done using a Judkins L 5 guide catheter. Right coronary angiography was done using a Judkins R4 guide catheter. A 30 cc ventriculogram was performed in the 30 degree RAO projection. Tolerated the procedure well. Sheath removed in the holding area.  CONTRAST: Total of 95 cc.  COMPLICATIONS: None.  HEMODYNAMICS: Aortic postcontrast 105/63, left ventricle postcontrast 105/5-13 area There was no gradient between the left ventricle and aorta.  ANGIOGRAPHIC DATA:  CORONARY ARTERIES: Arise and distribute normally. Co- dominant. Moderate coronary calcification is noted.  Left main coronary artery: Normal  Left anterior descending: Calcified proximally, first diagonal branch is totally occluded and fills by collaterals from the circumflex. There is a total occlusion of the LAD distally with bridging collaterals as well as collaterals from the right coronary artery  Circumflex coronary artery: Large codominant vessel. Severe eccentric 99% stenosis in the proximal circumflex followed by an eccentric tubular narrowing involving  the first marginal branch which is large. There is then some haziness or possible thrombus or competitive flow through atrial branch in the midportion. There is also collateral filling of the circumflex from the right coronary artery.  Right coronary artery: Severe stenosis involving a large acute marginal branch that supplies collaterals to the distal LAD and some collateral filling seen of the circumflex also.  LEFT VENTRICULOGRAM: Performed in the 30 RAO projection. The aortic and mitral valves are normal. The left ventricle is normal in size with normal wall motion. Estimated ejection fraction is 65%.  IMPRESSIONS:  1. Significant three-vessel coronary atherosclerosis 2. Normal left ventricle.Marland Kitchen RECOMMENDATION: Surgical consultation initially. Theoretically he could have PCI of the dominant circumflex and the right coronary artery but if the distal LAD could be bypassed this might be better for CABG.  Kerry Hough MD [facility]    Assessment / Plan:      1. Chest Pain - with three vessel CAD 2. Abnormal Exercise Stress Test  3. Obesity(BMI30-40)  4. Hypertension,Essential (Benign)   The patient has been seen and films reviewed patient has complex circumflex disease that is probably the source of his progressive anginal symptoms and positive stress test in the setting of significant three-vessel disease,  coronary artery bypass grafting is recommended to the patient for relief of symptoms and preservation of LV function. The distal LAD does appear bypassable and the complex nature of the disease in the circumflex except not ideal for stenting. Risks and options of surgery are discussed with the patient and his wife in detail. He is willing to proceed, because of his symptoms complex disease we'll plan to proceed with coronary artery bypass grafting on this admission. April 10. Patient is agreeable he and his wife have had their questions answered.  [name] MD      [address] [place] [address] Office [phone]   Beeper [phone]  04/16/2013 12:18 PM

## 2013-04-16 NOTE — H&P (View-Only) (Signed)
Gary [name] F  Date of visit:  04/15/2013 DOB:  [birth year]    Age:  58 yrs. Medical record number:  [id number]     Account number:  [id number] Primary Care Provider: Merrilee Seashore ____________________________ CURRENT DIAGNOSES  1. Chest Pain  2. Abnormal Exercise Stress Test  3. Obesity(BMI30-40)  4. Hypertension,Essential (Benign) ____________________________ ALLERGIES  IVP Dye, Iodine Containing, Hives and/or rash ____________________________ MEDICATIONS  1. hydrocodone 5 mg-acetaminophen 325 mg tablet, PRN  2. Vitamin C 1,000 mg tablet, 1 p.o. daily  3. multivitamin tablet, 1 p.o. daily  4. Aleve 220 mg tablet, PRN  5. allopurinol 100 mg tablet, 1 p.o. daily  6. Flomax 0.4 mg capsule, 1 p.o. daily  7. Avapro 150 mg tablet, 1 p.o. daily  8. coenzyme q-10 (ubiquinol) 200 mg capsule, 1 p.o. daily  9. magnesium 250 mg tablet, 1 p.o. daily  10. promethazine 6.25 mg/5 mL syrup, QHS  11. fluticasone 50 mcg/actuation nasal spray,suspension, qd  12. prednisone 20 mg tablet, take 3 po at 6 pm and the 3 po prior to coming to the hospital  13. metoprolol tartrate 25 mg tablet, BID  14. nitroglycerin 0.4 mg sublingual tablet, Take as directed ____________________________ CHIEF COMPLAINTS  Chest pressure with walking in january ____________________________ HISTORY OF PRESENT ILLNESS This 59 year old male is seen for evaluation of chest pressure. While at a trade show meeting he experienced the onset of midsternal discomfort associated with shortness of breath after exertion that occurred on several occasions while he was walking a pills or long distances. He would have to stop with relief of symptoms. He had a similar episode after he returned to town and later then developed "the flu" described as upper respiratory congestion, malaise and fatigue. He was seen recently by his primary physician who wanted him to have an exercise treadmill. He said was seen at the office today and walked  less than 6 minutes and the test was stopped due to severe dyspnea. He had a blunted heart rate response to exercise and a blunted blood pressure response to exercise and had 1-2 mm of ST depression in inferolateral leads that resolved in recovery. He did not have clinical anginal the treadmill today but had a markedly abnormal response to exercise. He was due to take a trip to the [facility] and after further consideration in view of the markedly abnormal exercise test and low-level positive test elected to proceed with catheterization at an earlier date. He does not have symptoms at rest. He has been obese for several years. He denies PND, orthopnea or edema. His blood pressure has been under fair control. He does have a history of allergy to contrast when he had a CT scan several years ago that evidently was self-limited and resolved with treatment. ____________________________ PAST HISTORY  Past Medical Illnesses:  hypertension, non alcololic fatty liver disease, gout, kidney stones, obesity;  Cardiovascular Illnesses:  no previous history of cardiac disease.;  Surgical Procedures:  kidney stone removal;  Cardiology Procedures-Invasive:  no history of prior cardiac procedures;  Cardiology Procedures-Noninvasive:  no previous non-invasive procedures;  LVEF not documented,   ____________________________ CARDIO-PULMONARY TEST DATES EKG Date:  04/15/2013;   ____________________________ FAMILY HISTORY Brother -- Brother alive and well Brother -- Brother dead, Malignant melanoma Father -- Father dead, Myocardial infarction at less than 17, Carcinoma of the pancreas Mother -- Mother alive and well ____________________________ SOCIAL HISTORY Alcohol Use:  occasionally;  Smoking:  used to smoke but quit, 25 pack year history;  Diet:  regular diet;  Lifestyle:  married;  Exercise:  no regular exercise;  Occupation:  Press photographer;  Residence:  lives with wife;   ____________________________ REVIEW OF  SYSTEMS General:  obesity  Integumentary:no rashes or new skin lesions. Eyes: dry eyes Ears, Nose, Throat, Mouth:  denies any hearing loss, epistaxis, hoarseness or difficulty speaking. Respiratory: dyspnea with exertion Cardiovascular:  please review HPI Abdominal: denies dyspepsia, GI bleeding, constipation, or diarrhea Genitourinary-Male: no dysuria, urgency, frequency, or nocturia  Musculoskeletal:  denies arthritis, venous insufficiency, or muscle weakness Neurological:  denies headaches, stroke, or TIA Psychiatric:  denies depession or anxiety  ____________________________ PHYSICAL EXAMINATION VITAL SIGNS  Blood Pressure:  118/74 Supine, Left arm, regular cuff  , 110/70 Standing, Left arm and regular cuff   Pulse:  76/min. Weight:  287.00 lbs. Height:  74"BMI: 37  Constitutional:  pleasant white male in no acute distress, moderately obese Skin:  warm and dry to touch, no apparent skin lesions, or masses noted. Head:  normocephalic, normal hair pattern, no masses or tenderness Eyes:  EOMS Intact, PERRLA, C and S clear, Funduscopic exam not done. ENT:  ears, nose and throat reveal no gross abnormalities.  Dentition good. Neck:  supple, without massess. No JVD, thyromegaly or carotid bruits. Carotid upstroke normal. Chest:  normal symmetry, clear to auscultation. Cardiac:  regular rhythm, normal S1 and S2, No S3 or S4, no murmurs, gallops or rubs detected. Abdomen:  abdomen soft,non-tender, no masses, no hepatospenomegaly, or aneurysm noted Peripheral Pulses:  the femoral,dorsalis pedis, and posterior tibial pulses are full and equal bilaterally with no bruits auscultated. Extremities & Back:  no deformities, clubbing, cyanosis, erythema or edema observed. Normal muscle strength and tone. Neurological:  no gross motor or sensory deficits noted, affect appropriate, oriented x3. ____________________________ IMPRESSIONS/PLAN  1. Chest pressure suggestive of angina with a markedly abnormal  stress test at a low level of exercise with a blunted heart rate response and blood pressure and as well as ST depression 2. Hypertension 3. Family history of cardiac disease 4. Obesity  Recommendations:  Cardiac catheterization was discussed with the patient including risks of myocardial infarction, death, stroke, bleeding, arrhythmia, dye allergy, or renal insufficiency. He understands and is willing to proceed. Possibility of percutaneous intervention at the same setting was also discussed with the patient including risks. He does have a previous allergy to contrast and will be premedicated with steroids, Benadryl and H2 blockers. He is agreeable and wishes to proceed. He was advised to take aspirin 81 mg daily and started on metoprolol 25 mg twice daily as well as given a prescription for nitroglycerin. ____________________________ TODAYS ORDERS  1. Comprehensive Metabolic Panel: Today  2. Complete Blood Count: Today  3. Draw PT/INR: Today  4. PTT: Today  5. Chest X-ray PA/Lat: today                       ____________________________ Cardiology Physician:  Kerry Hough MD [facility]

## 2013-04-17 ENCOUNTER — Inpatient Hospital Stay (HOSPITAL_COMMUNITY): Payer: BC Managed Care – PPO

## 2013-04-17 DIAGNOSIS — R079 Chest pain, unspecified: Secondary | ICD-10-CM

## 2013-04-17 DIAGNOSIS — I2510 Atherosclerotic heart disease of native coronary artery without angina pectoris: Secondary | ICD-10-CM

## 2013-04-17 DIAGNOSIS — I209 Angina pectoris, unspecified: Secondary | ICD-10-CM

## 2013-04-17 LAB — ABO/RH: ABO/RH(D): O NEG

## 2013-04-17 LAB — PROTIME-INR
INR: 0.97 (ref 0.00–1.49)
Prothrombin Time: 12.7 seconds (ref 11.6–15.2)

## 2013-04-17 LAB — URINALYSIS, ROUTINE W REFLEX MICROSCOPIC
Bilirubin Urine: NEGATIVE
Glucose, UA: NEGATIVE mg/dL
Hgb urine dipstick: NEGATIVE
Ketones, ur: NEGATIVE mg/dL
Nitrite: NEGATIVE
Protein, ur: NEGATIVE mg/dL
Specific Gravity, Urine: 1.022 (ref 1.005–1.030)
Urobilinogen, UA: 0.2 mg/dL (ref 0.0–1.0)
pH: 5 (ref 5.0–8.0)

## 2013-04-17 LAB — COMPREHENSIVE METABOLIC PANEL
ALT: 21 U/L (ref 0–53)
AST: 18 U/L (ref 0–37)
Albumin: 3.8 g/dL (ref 3.5–5.2)
Alkaline Phosphatase: 54 U/L (ref 39–117)
BUN: 28 mg/dL — ABNORMAL HIGH (ref 6–23)
CO2: 21 mEq/L (ref 19–32)
Calcium: 9.1 mg/dL (ref 8.4–10.5)
Chloride: 102 mEq/L (ref 96–112)
Creatinine, Ser: 1.25 mg/dL (ref 0.50–1.35)
GFR calc Af Amer: 73 mL/min — ABNORMAL LOW (ref >90)
GFR calc non Af Amer: 63 mL/min — ABNORMAL LOW (ref >90)
Glucose, Bld: 82 mg/dL (ref 70–99)
Potassium: 4 mEq/L (ref 3.7–5.3)
Sodium: 140 mEq/L (ref 137–147)
Total Bilirubin: 0.3 mg/dL (ref 0.3–1.2)
Total Protein: 7.1 g/dL (ref 6.0–8.3)

## 2013-04-17 LAB — TYPE AND SCREEN
ABO/RH(D): O NEG
Antibody Screen: NEGATIVE

## 2013-04-17 LAB — BLOOD GAS, ARTERIAL
Acid-base deficit: 3.6 mmol/L — ABNORMAL HIGH (ref 0.0–2.0)
Bicarbonate: 18.4 mEq/L — ABNORMAL LOW (ref 20.0–24.0)
Drawn by: 365291
FIO2: 0.21 %
O2 Saturation: 99.1 %
Patient temperature: 98.6
TCO2: 19.1 mmol/L (ref 0–100)
pCO2 arterial: 20.9 mmHg — ABNORMAL LOW (ref 35.0–45.0)
pH, Arterial: 7.555 — ABNORMAL HIGH (ref 7.350–7.450)
pO2, Arterial: 126 mmHg — ABNORMAL HIGH (ref 80.0–100.0)

## 2013-04-17 LAB — URINE MICROSCOPIC-ADD ON

## 2013-04-17 LAB — HEMOGLOBIN A1C
Hgb A1c MFr Bld: 6.1 % — ABNORMAL HIGH (ref <5.7)
Mean Plasma Glucose: 128 mg/dL — ABNORMAL HIGH (ref <117)

## 2013-04-17 LAB — APTT: aPTT: 34 seconds (ref 24–37)

## 2013-04-17 MED ORDER — VANCOMYCIN HCL 10 G IV SOLR
1500.0000 mg | INTRAVENOUS | Status: AC
  Administered 2013-04-18: 1500 mg via INTRAVENOUS
  Filled 2013-04-17 (×2): qty 1500

## 2013-04-17 MED ORDER — SODIUM CHLORIDE 0.9 % IV SOLN
INTRAVENOUS | Status: DC
  Filled 2013-04-17: qty 30

## 2013-04-17 MED ORDER — DEXMEDETOMIDINE HCL IN NACL 400 MCG/100ML IV SOLN
0.1000 ug/kg/h | INTRAVENOUS | Status: AC
  Administered 2013-04-18 (×2): .3 ug/kg/h via INTRAVENOUS
  Filled 2013-04-17: qty 100

## 2013-04-17 MED ORDER — TEMAZEPAM 15 MG PO CAPS
15.0000 mg | ORAL_CAPSULE | Freq: Once | ORAL | Status: AC | PRN
  Administered 2013-04-17: 15 mg via ORAL
  Filled 2013-04-17: qty 1

## 2013-04-17 MED ORDER — MAGNESIUM SULFATE 50 % IJ SOLN
40.0000 meq | INTRAMUSCULAR | Status: DC
  Filled 2013-04-17: qty 10

## 2013-04-17 MED ORDER — DEXTROSE 5 % IV SOLN
750.0000 mg | INTRAVENOUS | Status: DC
  Filled 2013-04-17: qty 750

## 2013-04-17 MED ORDER — POTASSIUM CHLORIDE 2 MEQ/ML IV SOLN
80.0000 meq | INTRAVENOUS | Status: DC
  Filled 2013-04-17: qty 40

## 2013-04-17 MED ORDER — SODIUM CHLORIDE 0.9 % IV SOLN
INTRAVENOUS | Status: AC
  Administered 2013-04-18: 69.8 mL/h via INTRAVENOUS
  Filled 2013-04-17: qty 40

## 2013-04-17 MED ORDER — METOPROLOL TARTRATE 12.5 MG HALF TABLET
12.5000 mg | ORAL_TABLET | Freq: Once | ORAL | Status: AC
  Administered 2013-04-18: 12.5 mg via ORAL
  Filled 2013-04-17: qty 1

## 2013-04-17 MED ORDER — CHLORHEXIDINE GLUCONATE 4 % EX LIQD
60.0000 mL | Freq: Once | CUTANEOUS | Status: AC
  Administered 2013-04-17: 4 via TOPICAL
  Filled 2013-04-17: qty 60

## 2013-04-17 MED ORDER — EPINEPHRINE HCL 1 MG/ML IJ SOLN
0.5000 ug/min | INTRAVENOUS | Status: DC
  Filled 2013-04-17: qty 4

## 2013-04-17 MED ORDER — SODIUM CHLORIDE 0.9 % IV SOLN
INTRAVENOUS | Status: AC
  Administered 2013-04-18: 1.6 [IU]/h via INTRAVENOUS
  Filled 2013-04-17: qty 1

## 2013-04-17 MED ORDER — BISACODYL 5 MG PO TBEC
5.0000 mg | DELAYED_RELEASE_TABLET | Freq: Once | ORAL | Status: AC
Start: 2013-04-17 — End: 2013-04-17
  Administered 2013-04-17: 5 mg via ORAL
  Filled 2013-04-17: qty 1

## 2013-04-17 MED ORDER — ALBUTEROL SULFATE (2.5 MG/3ML) 0.083% IN NEBU
2.5000 mg | INHALATION_SOLUTION | Freq: Once | RESPIRATORY_TRACT | Status: AC
  Administered 2013-04-17: 2.5 mg via RESPIRATORY_TRACT

## 2013-04-17 MED ORDER — DEXTROSE 5 % IV SOLN
1.5000 g | INTRAVENOUS | Status: AC
  Administered 2013-04-18: .75 g via INTRAVENOUS
  Administered 2013-04-18: 1.5 g via INTRAVENOUS
  Filled 2013-04-17 (×2): qty 1.5

## 2013-04-17 MED ORDER — CHLORHEXIDINE GLUCONATE 4 % EX LIQD
60.0000 mL | Freq: Once | CUTANEOUS | Status: AC
  Administered 2013-04-18: 4 via TOPICAL
  Filled 2013-04-17: qty 60

## 2013-04-17 MED ORDER — PLASMA-LYTE 148 IV SOLN
INTRAVENOUS | Status: AC
  Administered 2013-04-18: 09:00:00
  Filled 2013-04-17: qty 2.5

## 2013-04-17 MED ORDER — NITROGLYCERIN IN D5W 200-5 MCG/ML-% IV SOLN
2.0000 ug/min | INTRAVENOUS | Status: AC
  Administered 2013-04-18: 5 ug/min via INTRAVENOUS
  Filled 2013-04-17: qty 250

## 2013-04-17 MED ORDER — PHENYLEPHRINE HCL 10 MG/ML IJ SOLN
30.0000 ug/min | INTRAVENOUS | Status: AC
  Administered 2013-04-18: 20 ug/min via INTRAVENOUS
  Filled 2013-04-17: qty 2

## 2013-04-17 MED ORDER — DOPAMINE-DEXTROSE 3.2-5 MG/ML-% IV SOLN
2.0000 ug/kg/min | INTRAVENOUS | Status: DC
  Filled 2013-04-17: qty 250

## 2013-04-17 NOTE — Progress Notes (Signed)
    Subjective: Feels ok. Denies resting chest pain but he noted slight chest discomfort while walking earlier today.   Objective: Vital signs in last 24 hours: Temp:  [97.5 F (36.4 C)-98.8 F (37.1 C)] 98.8 F (37.1 C) (04/09 0800) Pulse Rate:  [61-91] 85 (04/09 1029) Resp:  [16-18] 18 (04/09 0800) BP: (94-124)/(57-82) 108/66 mmHg (04/09 1029) SpO2:  [98 %-100 %] 100 % (04/09 0800) Last BM Date: 04/16/13  Intake/Output from previous day: 04/08 0701 - 04/09 0700 In: 600 [P.O.:600] Out: -  Intake/Output this shift: Total I/O In: 480 [P.O.:480] Out: -   Medications Current Facility-Administered Medications  Medication Dose Route Frequency Provider Last Rate Last Dose  . 0.9 %  sodium chloride infusion  250 mL Intravenous PRN Jacolyn Reedy, MD      . allopurinol (ZYLOPRIM) tablet 400 mg  400 mg Oral Daily Jacolyn Reedy, MD   400 mg at 04/17/13 1030  . aspirin EC tablet 81 mg  81 mg Oral Daily Jacolyn Reedy, MD   81 mg at 04/17/13 1030  . enoxaparin (LOVENOX) injection 40 mg  40 mg Subcutaneous Q24H Jacolyn Reedy, MD   40 mg at 04/17/13 1031  . irbesartan (AVAPRO) tablet 150 mg  150 mg Oral Daily Jacolyn Reedy, MD   150 mg at 04/17/13 1030  . metoprolol tartrate (LOPRESSOR) tablet 25 mg  25 mg Oral BID Jacolyn Reedy, MD   25 mg at 04/17/13 1030  . sodium chloride 0.9 % injection 3 mL  3 mL Intravenous Q12H Jacolyn Reedy, MD   3 mL at 04/17/13 1031  . sodium chloride 0.9 % injection 3 mL  3 mL Intravenous PRN Jacolyn Reedy, MD      . tamsulosin [facility]) capsule 0.4 mg  0.4 mg Oral Daily Jacolyn Reedy, MD   0.4 mg at 04/17/13 1030  . vitamin C (ASCORBIC ACID) tablet 2,000 mg  2,000 mg Oral Daily Jacolyn Reedy, MD   2,000 mg at 04/17/13 1030    PE: General appearance: alert, cooperative, no distress and moderately obese Lungs: clear to auscultation bilaterally Heart: regular rate and rhythm, S1, S2 normal, no murmur, click, rub or  gallop Extremities: no LEE Pulses: 2+ and symmetric Skin: warm and dry Neurologic: Grossly normal  Lab Results:   Recent Labs  04/16/13 1300  WBC 15.5*  HGB 14.5  HCT 41.5  PLT 296   BMET  Recent Labs  04/16/13 1300  CREATININE 1.06    Assessment/Plan  Active Problems:   CAD (coronary artery disease)   Angina effort  Plan: Pt is scheduled to undergo CABG with Dr. Servando Snare in the am. BP is borderline hypo but stable. HR is stable. He denies resting CP. MD to follow.    LOS: 1 day    Brittainy M. Ladoris Gene 04/17/2013 11:37 AM  Agree with note written by Ellen Henri  [facility]  CAD scheduled for CABG. No CP/SOB  Lorretta Harp 04/17/2013 2:24 PM

## 2013-04-17 NOTE — Progress Notes (Signed)
CARDIAC REHAB PHASE I   PRE:  Rate/Rhythm: 61 SR  BP:  Supine:              Sitting: 92/64  Standing:    SaO2: 98 RA  MODE:  Ambulation: 550 ft   POST:  Rate/Rhythm: 54  BP:  Supine:   Sitting: 110./64  Standing:    SaO2: 100 RA 1210-1310 Pt tolerated ambulation well until end of walk c/o of slight chest pressure that went away with rest. VS stable pt to recliner after walk with call light in reach. Completed pre-op education with pt. I gave him pre-op surgery booklet and put going for heart surgery video on for him to watch. We discussed sternal precautions, waling post-op and use of IS.We will follow pt post-op as ordered.  Rodney Langton RN 04/17/2013 1:19 PM

## 2013-04-17 NOTE — Progress Notes (Signed)
Patient ID: Gary [name], male   DOB: [birth year], 58 y.o.   MRN: [id number]      [address]       [place] [address]             [phone]                 1 Day Post-Op Procedure(s) (LRB): LEFT AND RIGHT HEART CATHETERIZATION WITH CORONARY ANGIOGRAM (N/A)  LOS: 1 day   Subjective: No chest pain this am  Objective: Vital signs in last 24 hours: Patient Vitals for the past 24 hrs:  BP Temp Temp src Pulse Resp SpO2  04/17/13 0800 108/71 mmHg 98.8 F (37.1 C) Tympanic 67 18 100 %  04/17/13 0432 116/78 mmHg 97.5 F (36.4 C) Oral 61 16 98 %  04/16/13 2139 101/60 mmHg 98.8 F (37.1 C) Oral 73 18 98 %  04/16/13 1900 103/57 mmHg - - - - -  04/16/13 1600 94/57 mmHg 98 F (36.7 C) Oral 68 16 99 %  04/16/13 1400 102/65 mmHg - - 70 - -  04/16/13 1330 119/66 mmHg - - 78 - -  04/16/13 1300 124/82 mmHg - - 91 - -    Filed Weights   04/16/13 0638  Weight: 283 lb (128.368 kg)    Hemodynamic parameters for last 24 hours:    Intake/Output from previous day: 04/08 0701 - 04/09 0700 In: 600 [P.O.:600] Out: -  Intake/Output this shift:    Scheduled Meds: . allopurinol  400 mg Oral Daily  . aspirin EC  81 mg Oral Daily  . enoxaparin (LOVENOX) injection  40 mg Subcutaneous Q24H  . irbesartan  150 mg Oral Daily  . metoprolol tartrate  25 mg Oral BID  . sodium chloride  3 mL Intravenous Q12H  . tamsulosin  0.4 mg Oral Daily  . vitamin C  2,000 mg Oral Daily   Continuous Infusions:  PRN Meds:.sodium chloride, sodium chloride  General appearance: alert and cooperative Neurologic: intact Heart: regular rate and rhythm, S1, S2 normal, no murmur, click, rub or gallop Lungs: clear to auscultation bilaterally Abdomen: soft, non-tender; bowel sounds normal; no masses,  no organomegaly Extremities: extremities normal, atraumatic, no cyanosis or edema and Homans sign is negative, no sign of DVT  Lab Results: CBC: Recent Labs  04/16/13 1300  WBC 15.5*  HGB  14.5  HCT 41.5  PLT 296   BMET:  Recent Labs  04/16/13 1300  CREATININE 1.06    PT/INR: No results found for this basename: LABPROT, INR,  in the last 72 hours   Radiology Dg Chest 2 View  04/15/2013   CLINICAL DATA:  Chest pain, shortness of breath, cardiac catheterization gland  EXAM: CHEST  2 VIEW  COMPARISON:  None.  FINDINGS: Heart size and vascular pattern normal. Lungs clear. No effusion. Old left rib fractures noted.  IMPRESSION: No active cardiopulmonary disease.   Electronically Signed   By: Skipper Cliche M.D.   On: 04/15/2013 13:42     Assessment/Plan: S/P Procedure(s) (LRB): LEFT AND RIGHT HEART CATHETERIZATION WITH CORONARY ANGIOGRAM (N/A) plan for cabg in am CABG further discussed with patient and he is agreeable with proceeding The goals risks and alternatives of the planned surgical procedure CABG  have been discussed with the patient in detail. The risks of the procedure including death, infection, stroke, myocardial infarction, bleeding, blood transfusion have all been discussed specifically.  I have quoted Gary [name] a 2 % of perioperative mortality and a  complication rate as high as 20 %. The patient's questions have been answered.Gary [name] is willing  to proceed with the planned procedure. [name] MD 04/17/2013 8:59 AM

## 2013-04-17 NOTE — Progress Notes (Signed)
UR completed 

## 2013-04-18 ENCOUNTER — Encounter (HOSPITAL_COMMUNITY)
Admission: RE | Disposition: A | Discharge status: Home / Self Care | Payer: BC Managed Care – PPO | Source: Ambulatory Visit | Attending: Cardiothoracic Surgery

## 2013-04-18 ENCOUNTER — Inpatient Hospital Stay (HOSPITAL_COMMUNITY): Payer: BC Managed Care – PPO

## 2013-04-18 ENCOUNTER — Inpatient Hospital Stay (HOSPITAL_COMMUNITY): Payer: BC Managed Care – PPO | Admitting: Anesthesiology

## 2013-04-18 ENCOUNTER — Encounter (HOSPITAL_COMMUNITY): Payer: BC Managed Care – PPO | Admitting: Anesthesiology

## 2013-04-18 HISTORY — PX: INTRAOPERATIVE TRANSESOPHAGEAL ECHOCARDIOGRAM: SHX5062

## 2013-04-18 HISTORY — PX: CORONARY ARTERY BYPASS GRAFT: SHX141

## 2013-04-18 LAB — POCT I-STAT, CHEM 8
BUN: 19 mg/dL (ref 6–23)
CALCIUM ION: 1.16 mmol/L (ref 1.12–1.23)
Chloride: 104 mEq/L (ref 96–112)
Creatinine, Ser: 1.1 mg/dL (ref 0.50–1.35)
Glucose, Bld: 122 mg/dL — ABNORMAL HIGH (ref 70–99)
HEMATOCRIT: 37 % — AB (ref 39.0–52.0)
HEMOGLOBIN: 12.6 g/dL — AB (ref 13.0–17.0)
Potassium: 4.4 mEq/L (ref 3.7–5.3)
Sodium: 138 mEq/L (ref 137–147)
TCO2: 23 mmol/L (ref 0–100)

## 2013-04-18 LAB — CBC
HCT: 41.3 % (ref 39.0–52.0)
HCT: 37.7 % — ABNORMAL LOW (ref 39.0–52.0)
HCT: 36.1 % — ABNORMAL LOW (ref 39.0–52.0)
Hemoglobin: 14 g/dL (ref 13.0–17.0)
Hemoglobin: 12.9 g/dL — ABNORMAL LOW (ref 13.0–17.0)
Hemoglobin: 12.7 g/dL — ABNORMAL LOW (ref 13.0–17.0)
MCH: 32.2 pg (ref 26.0–34.0)
MCH: 32.6 pg (ref 26.0–34.0)
MCH: 33.3 pg (ref 26.0–34.0)
MCHC: 33.9 g/dL (ref 30.0–36.0)
MCHC: 34.2 g/dL (ref 30.0–36.0)
MCHC: 35.2 g/dL (ref 30.0–36.0)
MCV: 94.9 fL (ref 78.0–100.0)
MCV: 95.2 fL (ref 78.0–100.0)
MCV: 94.8 fL (ref 78.0–100.0)
Platelets: 241 10*3/uL (ref 150–400)
Platelets: 189 10*3/uL (ref 150–400)
Platelets: 232 10*3/uL (ref 150–400)
RBC: 4.35 MIL/uL (ref 4.22–5.81)
RBC: 3.96 MIL/uL — ABNORMAL LOW (ref 4.22–5.81)
RBC: 3.81 MIL/uL — ABNORMAL LOW (ref 4.22–5.81)
RDW: 13.2 % (ref 11.5–15.5)
RDW: 13 % (ref 11.5–15.5)
RDW: 13.1 % (ref 11.5–15.5)
WBC: 8.1 10*3/uL (ref 4.0–10.5)
WBC: 23.4 10*3/uL — ABNORMAL HIGH (ref 4.0–10.5)
WBC: 21.4 10*3/uL — ABNORMAL HIGH (ref 4.0–10.5)

## 2013-04-18 LAB — POCT I-STAT GLUCOSE
GLUCOSE: 126 mg/dL — AB (ref 70–99)
OPERATOR ID: 3406

## 2013-04-18 LAB — POCT I-STAT 3, ART BLOOD GAS (G3+)
ACID-BASE DEFICIT: 1 mmol/L (ref 0.0–2.0)
ACID-BASE DEFICIT: 4 mmol/L — AB (ref 0.0–2.0)
ACID-BASE DEFICIT: 3 mmol/L — AB (ref 0.0–2.0)
ACID-BASE DEFICIT: 3 mmol/L — AB (ref 0.0–2.0)
Bicarbonate: 25.2 mEq/L — ABNORMAL HIGH (ref 20.0–24.0)
Bicarbonate: 21.5 mEq/L (ref 20.0–24.0)
Bicarbonate: 22.5 mEq/L (ref 20.0–24.0)
Bicarbonate: 23.1 mEq/L (ref 20.0–24.0)
O2 SAT: 92 %
O2 SAT: 96 %
O2 SAT: 94 %
O2 Saturation: 100 %
PH ART: 7.338 — AB (ref 7.350–7.450)
PO2 ART: 59 mmHg — AB (ref 80.0–100.0)
PO2 ART: 86 mmHg (ref 80.0–100.0)
Patient temperature: 35.4
Patient temperature: 36.7
TCO2: 27 mmol/L (ref 0–100)
TCO2: 23 mmol/L (ref 0–100)
TCO2: 24 mmol/L (ref 0–100)
TCO2: 24 mmol/L (ref 0–100)
pCO2 arterial: 47 mmHg — ABNORMAL HIGH (ref 35.0–45.0)
pCO2 arterial: 36.3 mmHg (ref 35.0–45.0)
pCO2 arterial: 38.9 mmHg (ref 35.0–45.0)
pCO2 arterial: 43.2 mmHg (ref 35.0–45.0)
pH, Arterial: 7.374 (ref 7.350–7.450)
pH, Arterial: 7.37 (ref 7.350–7.450)
pH, Arterial: 7.336 — ABNORMAL LOW (ref 7.350–7.450)
pO2, Arterial: 336 mmHg — ABNORMAL HIGH (ref 80.0–100.0)
pO2, Arterial: 77 mmHg — ABNORMAL LOW (ref 80.0–100.0)

## 2013-04-18 LAB — BASIC METABOLIC PANEL
BUN: 27 mg/dL — ABNORMAL HIGH (ref 6–23)
CO2: 21 mEq/L (ref 19–32)
Calcium: 9.2 mg/dL (ref 8.4–10.5)
Chloride: 103 mEq/L (ref 96–112)
Creatinine, Ser: 1.22 mg/dL (ref 0.50–1.35)
GFR calc Af Amer: 75 mL/min — ABNORMAL LOW (ref >90)
GFR calc non Af Amer: 65 mL/min — ABNORMAL LOW (ref >90)
Glucose, Bld: 83 mg/dL (ref 70–99)
Potassium: 4.1 mEq/L (ref 3.7–5.3)
Sodium: 141 mEq/L (ref 137–147)

## 2013-04-18 LAB — POCT I-STAT 4, (NA,K, GLUC, HGB,HCT)
GLUCOSE: 112 mg/dL — AB (ref 70–99)
GLUCOSE: 94 mg/dL (ref 70–99)
GLUCOSE: 160 mg/dL — AB (ref 70–99)
GLUCOSE: 112 mg/dL — AB (ref 70–99)
Glucose, Bld: 105 mg/dL — ABNORMAL HIGH (ref 70–99)
Glucose, Bld: 137 mg/dL — ABNORMAL HIGH (ref 70–99)
HCT: 35 % — ABNORMAL LOW (ref 39.0–52.0)
HCT: 33 % — ABNORMAL LOW (ref 39.0–52.0)
HCT: 32 % — ABNORMAL LOW (ref 39.0–52.0)
HEMATOCRIT: 38 % — AB (ref 39.0–52.0)
HEMATOCRIT: 29 % — AB (ref 39.0–52.0)
HEMATOCRIT: 38 % — AB (ref 39.0–52.0)
HEMOGLOBIN: 12.9 g/dL — AB (ref 13.0–17.0)
HEMOGLOBIN: 9.9 g/dL — AB (ref 13.0–17.0)
HEMOGLOBIN: 11.2 g/dL — AB (ref 13.0–17.0)
Hemoglobin: 11.9 g/dL — ABNORMAL LOW (ref 13.0–17.0)
Hemoglobin: 10.9 g/dL — ABNORMAL LOW (ref 13.0–17.0)
Hemoglobin: 12.9 g/dL — ABNORMAL LOW (ref 13.0–17.0)
POTASSIUM: 4.4 meq/L (ref 3.7–5.3)
Potassium: 4.2 mEq/L (ref 3.7–5.3)
Potassium: 4.4 mEq/L (ref 3.7–5.3)
Potassium: 5 mEq/L (ref 3.7–5.3)
Potassium: 4.7 mEq/L (ref 3.7–5.3)
Potassium: 4.3 mEq/L (ref 3.7–5.3)
SODIUM: 138 meq/L (ref 137–147)
SODIUM: 135 meq/L — AB (ref 137–147)
SODIUM: 136 meq/L — AB (ref 137–147)
Sodium: 139 mEq/L (ref 137–147)
Sodium: 133 mEq/L — ABNORMAL LOW (ref 137–147)
Sodium: 138 mEq/L (ref 137–147)

## 2013-04-18 LAB — APTT: aPTT: 31 seconds (ref 24–37)

## 2013-04-18 LAB — HEMOGLOBIN AND HEMATOCRIT, BLOOD
HCT: 33.3 % — ABNORMAL LOW (ref 39.0–52.0)
Hemoglobin: 11.3 g/dL — ABNORMAL LOW (ref 13.0–17.0)

## 2013-04-18 LAB — MAGNESIUM: Magnesium: 2.7 mg/dL — ABNORMAL HIGH (ref 1.5–2.5)

## 2013-04-18 LAB — CREATININE, SERUM
Creatinine, Ser: 0.92 mg/dL (ref 0.50–1.35)
GFR calc Af Amer: >90 mL/min (ref >90)
GFR calc non Af Amer: >90 mL/min (ref >90)

## 2013-04-18 LAB — PROTIME-INR
INR: 1.27 (ref 0.00–1.49)
Prothrombin Time: 15.6 seconds — ABNORMAL HIGH (ref 11.6–15.2)

## 2013-04-18 LAB — MRSA PCR SCREENING: MRSA by PCR: NEGATIVE

## 2013-04-18 LAB — PLATELET COUNT: Platelets: 191 10*3/uL (ref 150–400)

## 2013-04-18 SURGERY — CORONARY ARTERY BYPASS GRAFTING (CABG)
Anesthesia: General | Site: Chest

## 2013-04-18 MED ORDER — ASPIRIN EC 325 MG PO TBEC
325.0000 mg | DELAYED_RELEASE_TABLET | Freq: Every day | ORAL | Status: DC
  Administered 2013-04-19 – 2013-04-24 (×6): 325 mg via ORAL
  Filled 2013-04-18 (×6): qty 1

## 2013-04-18 MED ORDER — SODIUM CHLORIDE 0.9 % IJ SOLN
3.0000 mL | Freq: Two times a day (BID) | INTRAMUSCULAR | Status: DC
  Administered 2013-04-19 – 2013-04-23 (×9): 3 mL via INTRAVENOUS

## 2013-04-18 MED ORDER — VANCOMYCIN HCL IN DEXTROSE 1-5 GM/200ML-% IV SOLN
1000.0000 mg | Freq: Once | INTRAVENOUS | Status: AC
  Administered 2013-04-18: 1000 mg via INTRAVENOUS
  Filled 2013-04-18: qty 200

## 2013-04-18 MED ORDER — METOPROLOL TARTRATE 25 MG/10 ML ORAL SUSPENSION
12.5000 mg | Freq: Two times a day (BID) | ORAL | Status: DC
  Filled 2013-04-18 (×9): qty 5

## 2013-04-18 MED ORDER — MIDAZOLAM HCL 10 MG/2ML IJ SOLN
INTRAMUSCULAR | Status: AC
Start: 2013-04-18 — End: 2013-04-18
  Filled 2013-04-18: qty 2

## 2013-04-18 MED ORDER — ACETAMINOPHEN 160 MG/5ML PO SOLN: 650.0000 mg | Freq: Once | ORAL | Status: AC

## 2013-04-18 MED ORDER — SODIUM CHLORIDE 0.9 % IV SOLN
INTRAVENOUS | Status: DC
  Administered 2013-04-18: 20 mL/h via INTRAVENOUS

## 2013-04-18 MED ORDER — SODIUM CHLORIDE 0.9 % IJ SOLN
OROMUCOSAL | Status: DC | PRN
  Administered 2013-04-18 (×3): via TOPICAL

## 2013-04-18 MED ORDER — DOCUSATE SODIUM 100 MG PO CAPS
200.0000 mg | ORAL_CAPSULE | Freq: Every day | ORAL | Status: DC
  Administered 2013-04-20 – 2013-04-24 (×5): 200 mg via ORAL
  Filled 2013-04-18 (×6): qty 2

## 2013-04-18 MED ORDER — PROPOFOL 10 MG/ML IV BOLUS
INTRAVENOUS | Status: AC
  Filled 2013-04-18: qty 20

## 2013-04-18 MED ORDER — PROTAMINE SULFATE 10 MG/ML IV SOLN
INTRAVENOUS | Status: AC
  Filled 2013-04-18: qty 25

## 2013-04-18 MED ORDER — ALBUMIN HUMAN 5 % IV SOLN
INTRAVENOUS | Status: DC | PRN
  Administered 2013-04-18: 13:00:00 via INTRAVENOUS

## 2013-04-18 MED ORDER — MIDAZOLAM HCL 2 MG/2ML IJ SOLN: 2.0000 mg | INTRAMUSCULAR | Status: DC | PRN

## 2013-04-18 MED ORDER — ACETAMINOPHEN 160 MG/5ML PO SOLN: 1000.0000 mg | Freq: Four times a day (QID) | ORAL | Status: DC

## 2013-04-18 MED ORDER — LACTATED RINGERS IV SOLN
INTRAVENOUS | Status: DC | PRN
  Administered 2013-04-18: 06:00:00 via INTRAVENOUS

## 2013-04-18 MED ORDER — FENTANYL CITRATE 0.05 MG/ML IJ SOLN
INTRAMUSCULAR | Status: AC
  Filled 2013-04-18: qty 5

## 2013-04-18 MED ORDER — 0.9 % SODIUM CHLORIDE (POUR BTL) OPTIME
TOPICAL | Status: DC | PRN
  Administered 2013-04-18: 6000 mL

## 2013-04-18 MED ORDER — SODIUM CHLORIDE 0.9 % IJ SOLN
INTRAMUSCULAR | Status: AC
  Filled 2013-04-18: qty 10

## 2013-04-18 MED ORDER — SODIUM CHLORIDE 0.9 % IV SOLN: 250.0000 mL | INTRAVENOUS | Status: DC

## 2013-04-18 MED ORDER — LACTATED RINGERS IV SOLN
INTRAVENOUS | Status: DC | PRN
  Administered 2013-04-18 (×2): via INTRAVENOUS

## 2013-04-18 MED ORDER — ASPIRIN 81 MG PO CHEW: 324.0000 mg | CHEWABLE_TABLET | Freq: Every day | ORAL | Status: DC

## 2013-04-18 MED ORDER — LACTATED RINGERS IV SOLN: 500.0000 mL | Freq: Once | INTRAVENOUS | Status: AC | PRN

## 2013-04-18 MED ORDER — INSULIN REGULAR HUMAN 100 UNIT/ML IJ SOLN
INTRAMUSCULAR | Status: DC
  Administered 2013-04-18: 20:00:00 via INTRAVENOUS
  Administered 2013-04-18: 1.6 [IU]/h via INTRAVENOUS
  Filled 2013-04-18: qty 1

## 2013-04-18 MED ORDER — MAGNESIUM SULFATE 4000MG/100ML IJ SOLN
4.0000 g | Freq: Once | INTRAMUSCULAR | Status: AC
  Administered 2013-04-18: 4 g via INTRAVENOUS
  Filled 2013-04-18: qty 100

## 2013-04-18 MED ORDER — HEPARIN SODIUM (PORCINE) 1000 UNIT/ML IJ SOLN
INTRAMUSCULAR | Status: AC
  Filled 2013-04-18: qty 1

## 2013-04-18 MED ORDER — SODIUM CHLORIDE 0.9 % IJ SOLN
INTRAMUSCULAR | Status: AC
  Filled 2013-04-18: qty 20

## 2013-04-18 MED ORDER — LIDOCAINE HCL (CARDIAC) 20 MG/ML IV SOLN
INTRAVENOUS | Status: AC
  Filled 2013-04-18: qty 5

## 2013-04-18 MED ORDER — PROPOFOL 10 MG/ML IV BOLUS
INTRAVENOUS | Status: DC | PRN
  Administered 2013-04-18: 80 mg via INTRAVENOUS

## 2013-04-18 MED ORDER — SODIUM CHLORIDE 0.9 % IV SOLN
0.5000 g/h | INTRAVENOUS | Status: DC
  Filled 2013-04-18: qty 20

## 2013-04-18 MED ORDER — PROTAMINE SULFATE 10 MG/ML IV SOLN
INTRAVENOUS | Status: DC | PRN
  Administered 2013-04-18: 250 mg via INTRAVENOUS

## 2013-04-18 MED ORDER — DEXMEDETOMIDINE HCL IN NACL 400 MCG/100ML IV SOLN
0.4000 ug/kg/h | INTRAVENOUS | Status: DC
  Filled 2013-04-18: qty 100

## 2013-04-18 MED ORDER — ACETAMINOPHEN 650 MG RE SUPP
650.0000 mg | Freq: Once | RECTAL | Status: AC
  Administered 2013-04-18: 650 mg via RECTAL

## 2013-04-18 MED ORDER — CALCIUM CHLORIDE 10 % IV SOLN
INTRAVENOUS | Status: AC
  Filled 2013-04-18: qty 10

## 2013-04-18 MED ORDER — ROCURONIUM BROMIDE 50 MG/5ML IV SOLN
INTRAVENOUS | Status: AC
  Filled 2013-04-18: qty 1

## 2013-04-18 MED ORDER — SODIUM CHLORIDE 0.45 % IV SOLN
INTRAVENOUS | Status: DC
  Administered 2013-04-18: 20 mL/h via INTRAVENOUS

## 2013-04-18 MED ORDER — ALBUMIN HUMAN 5 % IV SOLN
250.0000 mL | INTRAVENOUS | Status: AC | PRN
  Administered 2013-04-18 (×2): 250 mL via INTRAVENOUS

## 2013-04-18 MED ORDER — FENTANYL CITRATE 0.05 MG/ML IJ SOLN
INTRAMUSCULAR | Status: DC | PRN
  Administered 2013-04-18: 250 ug via INTRAVENOUS
  Administered 2013-04-18: 150 ug via INTRAVENOUS
  Administered 2013-04-18: 500 ug via INTRAVENOUS
  Administered 2013-04-18: 250 ug via INTRAVENOUS
  Administered 2013-04-18: 500 ug via INTRAVENOUS
  Administered 2013-04-18: 100 ug via INTRAVENOUS
  Administered 2013-04-18: 150 ug via INTRAVENOUS
  Administered 2013-04-18: 100 ug via INTRAVENOUS

## 2013-04-18 MED ORDER — DEXTROSE 5 % IV SOLN
1.5000 g | Freq: Two times a day (BID) | INTRAVENOUS | Status: AC
  Administered 2013-04-18 – 2013-04-20 (×4): 1.5 g via INTRAVENOUS
  Filled 2013-04-18 (×4): qty 1.5

## 2013-04-18 MED ORDER — ARTIFICIAL TEARS OP OINT
TOPICAL_OINTMENT | OPHTHALMIC | Status: DC | PRN
  Administered 2013-04-18: 1 via OPHTHALMIC

## 2013-04-18 MED ORDER — METOPROLOL TARTRATE 12.5 MG HALF TABLET
12.5000 mg | ORAL_TABLET | Freq: Two times a day (BID) | ORAL | Status: DC
  Administered 2013-04-19 – 2013-04-21 (×3): 12.5 mg via ORAL
  Filled 2013-04-18 (×9): qty 1

## 2013-04-18 MED ORDER — MIDAZOLAM HCL 10 MG/2ML IJ SOLN
INTRAMUSCULAR | Status: AC
  Filled 2013-04-18: qty 2

## 2013-04-18 MED ORDER — METOPROLOL TARTRATE 1 MG/ML IV SOLN
2.5000 mg | INTRAVENOUS | Status: DC | PRN
  Administered 2013-04-20: 2.5 mg via INTRAVENOUS

## 2013-04-18 MED ORDER — BISACODYL 10 MG RE SUPP: 10.0000 mg | Freq: Every day | RECTAL | Status: DC

## 2013-04-18 MED ORDER — MORPHINE SULFATE 2 MG/ML IJ SOLN
2.0000 mg | INTRAMUSCULAR | Status: DC | PRN
  Administered 2013-04-18: 4 mg via INTRAVENOUS
  Administered 2013-04-18 – 2013-04-19 (×3): 2 mg via INTRAVENOUS
  Administered 2013-04-19: 4 mg via INTRAVENOUS
  Administered 2013-04-20: 2 mg via INTRAVENOUS
  Filled 2013-04-18 (×3): qty 1
  Filled 2013-04-18: qty 2
  Filled 2013-04-18 (×3): qty 1
  Filled 2013-04-18: qty 2

## 2013-04-18 MED ORDER — HEMOSTATIC AGENTS (NO CHARGE) OPTIME
TOPICAL | Status: DC | PRN
  Administered 2013-04-18: 1 via TOPICAL

## 2013-04-18 MED ORDER — DEXMEDETOMIDINE HCL IN NACL 200 MCG/50ML IV SOLN: 0.1000 ug/kg/h | INTRAVENOUS | Status: DC

## 2013-04-18 MED ORDER — ROCURONIUM BROMIDE 50 MG/5ML IV SOLN
INTRAVENOUS | Status: AC
  Filled 2013-04-18: qty 4

## 2013-04-18 MED ORDER — ONDANSETRON HCL 4 MG/2ML IJ SOLN
4.0000 mg | Freq: Four times a day (QID) | INTRAMUSCULAR | Status: DC | PRN
Start: 2013-04-18 — End: 2013-04-24

## 2013-04-18 MED ORDER — ACETAMINOPHEN 500 MG PO TABS
1000.0000 mg | ORAL_TABLET | Freq: Four times a day (QID) | ORAL | Status: DC
  Administered 2013-04-19 – 2013-04-20 (×6): 1000 mg via ORAL
  Filled 2013-04-18 (×10): qty 2

## 2013-04-18 MED ORDER — MORPHINE SULFATE 2 MG/ML IJ SOLN
1.0000 mg | INTRAMUSCULAR | Status: AC | PRN
  Administered 2013-04-18: 1 mg via INTRAVENOUS
  Administered 2013-04-18: 2 mg via INTRAVENOUS

## 2013-04-18 MED ORDER — OXYCODONE HCL 5 MG PO TABS
5.0000 mg | ORAL_TABLET | ORAL | Status: DC | PRN
  Administered 2013-04-18: 5 mg via ORAL
  Administered 2013-04-18 – 2013-04-22 (×11): 10 mg via ORAL
  Administered 2013-04-22: 5 mg via ORAL
  Administered 2013-04-22 – 2013-04-23 (×3): 10 mg via ORAL
  Administered 2013-04-23: 5 mg via ORAL
  Administered 2013-04-23: 10 mg via ORAL
  Filled 2013-04-18 (×15): qty 2
  Filled 2013-04-18 (×2): qty 1
  Filled 2013-04-18: qty 2

## 2013-04-18 MED ORDER — INSULIN REGULAR BOLUS VIA INFUSION
0.0000 [IU] | Freq: Three times a day (TID) | INTRAVENOUS | Status: DC
  Administered 2013-04-18 (×2): 2 [IU] via INTRAVENOUS
  Filled 2013-04-18: qty 10

## 2013-04-18 MED ORDER — LACTATED RINGERS IV SOLN
INTRAVENOUS | Status: DC
  Administered 2013-04-18: 20 mL/h via INTRAVENOUS
  Administered 2013-04-21: 22:00:00 via INTRAVENOUS

## 2013-04-18 MED ORDER — HEPARIN SODIUM (PORCINE) 1000 UNIT/ML IJ SOLN
INTRAMUSCULAR | Status: DC | PRN
  Administered 2013-04-18: 31000 [IU] via INTRAVENOUS
  Administered 2013-04-18: 2000 [IU] via INTRAVENOUS

## 2013-04-18 MED ORDER — LACTATED RINGERS IV SOLN
INTRAVENOUS | Status: DC | PRN
  Administered 2013-04-18 (×2): via INTRAVENOUS

## 2013-04-18 MED ORDER — FAMOTIDINE IN NACL 20-0.9 MG/50ML-% IV SOLN
20.0000 mg | Freq: Two times a day (BID) | INTRAVENOUS | Status: AC
  Administered 2013-04-18: 20 mg via INTRAVENOUS

## 2013-04-18 MED ORDER — MIDAZOLAM HCL 5 MG/5ML IJ SOLN
INTRAMUSCULAR | Status: DC | PRN
  Administered 2013-04-18: 3 mg via INTRAVENOUS
  Administered 2013-04-18: 5 mg via INTRAVENOUS
  Administered 2013-04-18 (×2): 2 mg via INTRAVENOUS

## 2013-04-18 MED ORDER — NITROGLYCERIN IN D5W 200-5 MCG/ML-% IV SOLN
0.0000 ug/min | INTRAVENOUS | Status: DC
  Administered 2013-04-18: 5 ug/min via INTRAVENOUS

## 2013-04-18 MED ORDER — DEXTROSE 5 % IV SOLN
0.0000 ug/min | INTRAVENOUS | Status: DC
  Filled 2013-04-18: qty 2

## 2013-04-18 MED ORDER — ROCURONIUM BROMIDE 100 MG/10ML IV SOLN
INTRAVENOUS | Status: DC | PRN
  Administered 2013-04-18: 20 mg via INTRAVENOUS
  Administered 2013-04-18 (×4): 50 mg via INTRAVENOUS
  Administered 2013-04-18: 30 mg via INTRAVENOUS

## 2013-04-18 MED ORDER — BISACODYL 5 MG PO TBEC
10.0000 mg | DELAYED_RELEASE_TABLET | Freq: Every day | ORAL | Status: DC
  Administered 2013-04-20 – 2013-04-24 (×5): 10 mg via ORAL
  Filled 2013-04-18 (×5): qty 2

## 2013-04-18 MED ORDER — PANTOPRAZOLE SODIUM 40 MG PO TBEC
40.0000 mg | DELAYED_RELEASE_TABLET | Freq: Every day | ORAL | Status: DC
  Administered 2013-04-20 – 2013-04-24 (×5): 40 mg via ORAL
  Filled 2013-04-18 (×4): qty 1

## 2013-04-18 MED ORDER — ARTIFICIAL TEARS OP OINT
TOPICAL_OINTMENT | OPHTHALMIC | Status: AC
  Filled 2013-04-18: qty 3.5

## 2013-04-18 MED ORDER — POTASSIUM CHLORIDE 10 MEQ/50ML IV SOLN: 10.0000 meq | INTRAVENOUS | Status: AC

## 2013-04-18 MED ORDER — SODIUM CHLORIDE 0.9 % IJ SOLN: 3.0000 mL | INTRAMUSCULAR | Status: DC | PRN

## 2013-04-18 MED FILL — Potassium Chloride Inj 2 mEq/ML: INTRAVENOUS | Qty: 40 | Status: AC

## 2013-04-18 MED FILL — Magnesium Sulfate Inj 50%: INTRAMUSCULAR | Qty: 10 | Status: AC

## 2013-04-18 MED FILL — Heparin Sodium (Porcine) Inj 1000 Unit/ML: INTRAMUSCULAR | Qty: 30 | Status: AC

## 2013-04-18 SURGICAL SUPPLY — 78 items
ADH SKN CLS APL DERMABOND .7 (GAUZE/BANDAGES/DRESSINGS) ×2
ATTRACTOMAT 16X20 MAGNETIC DRP (DRAPES) ×3 IMPLANT
BAG DECANTER FOR FLEXI CONT (MISCELLANEOUS) ×3 IMPLANT
BANDAGE ELASTIC 4 VELCRO ST LF (GAUZE/BANDAGES/DRESSINGS) ×3 IMPLANT
BANDAGE ELASTIC 6 VELCRO ST LF (GAUZE/BANDAGES/DRESSINGS) ×3 IMPLANT
BANDAGE GAUZE ELAST BULKY 4 IN (GAUZE/BANDAGES/DRESSINGS) ×3 IMPLANT
BLADE STERNUM SYSTEM 6 (BLADE) ×3 IMPLANT
BLADE SURG 11 STRL SS (BLADE) ×1 IMPLANT
CANISTER SUCTION 2500CC (MISCELLANEOUS) ×3 IMPLANT
CANNULA ARTERIAL NVNT 3/8 22FR (MISCELLANEOUS) ×1 IMPLANT
CARDIAC SUCTION (MISCELLANEOUS) ×3 IMPLANT
CATH CPB KIT GERHARDT (MISCELLANEOUS) ×3 IMPLANT
CATH THORACIC 28FR (CATHETERS) ×3 IMPLANT
CLIP FOGARTY SPRING 6M (CLIP) ×1 IMPLANT
CLIP TI WIDE RED SMALL 24 (CLIP) ×1 IMPLANT
COVER SURGICAL LIGHT HANDLE (MISCELLANEOUS) ×3 IMPLANT
CRADLE DONUT ADULT HEAD (MISCELLANEOUS) ×3 IMPLANT
DERMABOND ADVANCED (GAUZE/BANDAGES/DRESSINGS) ×1
DERMABOND ADVANCED .7 DNX12 (GAUZE/BANDAGES/DRESSINGS) IMPLANT
DRAIN CHANNEL 28F RND 3/8 FF (WOUND CARE) ×3 IMPLANT
DRAPE CARDIOVASCULAR INCISE (DRAPES) ×3
DRAPE SLUSH/WARMER DISC (DRAPES) ×3 IMPLANT
DRAPE SRG 135X102X78XABS (DRAPES) ×2 IMPLANT
DRSG AQUACEL AG ADV 3.5X14 (GAUZE/BANDAGES/DRESSINGS) ×3 IMPLANT
ELECT BLADE 4.0 EZ CLEAN MEGAD (MISCELLANEOUS) ×3
ELECT REM PT RETURN 9FT ADLT (ELECTROSURGICAL) ×6
ELECTRODE BLDE 4.0 EZ CLN MEGD (MISCELLANEOUS) ×2 IMPLANT
ELECTRODE REM PT RTRN 9FT ADLT (ELECTROSURGICAL) ×4 IMPLANT
GLOVE BIO SURGEON STRL SZ 6 (GLOVE) ×2 IMPLANT
GLOVE BIO SURGEON STRL SZ 6.5 (GLOVE) ×14 IMPLANT
GLOVE BIO SURGEON STRL SZ7 (GLOVE) ×1 IMPLANT
GLOVE BIOGEL PI IND STRL 6 (GLOVE) IMPLANT
GLOVE BIOGEL PI IND STRL 6.5 (GLOVE) IMPLANT
GLOVE BIOGEL PI INDICATOR 6 (GLOVE) ×3
GLOVE BIOGEL PI INDICATOR 6.5 (GLOVE) ×3
GOWN STRL REUS W/ TWL LRG LVL3 (GOWN DISPOSABLE) ×8 IMPLANT
GOWN STRL REUS W/TWL LRG LVL3 (GOWN DISPOSABLE) ×27
HEMOSTAT POWDER SURGIFOAM 1G (HEMOSTASIS) ×9 IMPLANT
HEMOSTAT SURGICEL 2X14 (HEMOSTASIS) ×3 IMPLANT
KIT BASIN OR (CUSTOM PROCEDURE TRAY) ×3 IMPLANT
KIT ROOM TURNOVER OR (KITS) ×3 IMPLANT
KIT SUCTION CATH 14FR (SUCTIONS) ×6 IMPLANT
KIT VASOVIEW W/TROCAR VH 2000 (KITS) ×3 IMPLANT
LEAD PACING MYOCARDI (MISCELLANEOUS) ×3 IMPLANT
MARKER GRAFT CORONARY BYPASS (MISCELLANEOUS) ×9 IMPLANT
NS IRRIG 1000ML POUR BTL (IV SOLUTION) ×16 IMPLANT
PACK OPEN HEART (CUSTOM PROCEDURE TRAY) ×3 IMPLANT
PAD ARMBOARD 7.5X6 YLW CONV (MISCELLANEOUS) ×6 IMPLANT
PAD ELECT DEFIB RADIOL ZOLL (MISCELLANEOUS) ×3 IMPLANT
PENCIL BUTTON HOLSTER BLD 10FT (ELECTRODE) ×3 IMPLANT
PUNCH AORTIC ROTATE 4.5MM 8IN (MISCELLANEOUS) ×1 IMPLANT
SET CARDIOPLEGIA MPS 5001102 (MISCELLANEOUS) ×1 IMPLANT
SPONGE GAUZE 4X4 12PLY (GAUZE/BANDAGES/DRESSINGS) ×6 IMPLANT
SPONGE GAUZE 4X4 12PLY STER LF (GAUZE/BANDAGES/DRESSINGS) ×2 IMPLANT
SPONGE LAP 18X18 X RAY DECT (DISPOSABLE) ×2 IMPLANT
SUT BONE WAX W31G (SUTURE) ×3 IMPLANT
SUT MNCRL AB 4-0 PS2 18 (SUTURE) ×1 IMPLANT
SUT PROLENE 3 0 SH1 36 (SUTURE) ×3 IMPLANT
SUT PROLENE 4 0 TF (SUTURE) ×6 IMPLANT
SUT PROLENE 6 0 C 1 30 (SUTURE) ×1 IMPLANT
SUT PROLENE 6 0 CC (SUTURE) ×8 IMPLANT
SUT PROLENE 7 0 BV1 MDA (SUTURE) ×4 IMPLANT
SUT PROLENE 8 0 BV175 6 (SUTURE) ×10 IMPLANT
SUT STEEL 6MS V (SUTURE) ×3 IMPLANT
SUT STEEL SZ 6 DBL 3X14 BALL (SUTURE) ×3 IMPLANT
SUT VIC AB 1 CTX 18 (SUTURE) ×6 IMPLANT
SUT VIC AB 2-0 CT1 27 (SUTURE) ×3
SUT VIC AB 2-0 CT1 TAPERPNT 27 (SUTURE) IMPLANT
SUTURE E-PAK OPEN HEART (SUTURE) ×3 IMPLANT
SYSTEM SAHARA CHEST DRAIN ATS (WOUND CARE) ×3 IMPLANT
TAPE CLOTH SURG 4X10 WHT LF (GAUZE/BANDAGES/DRESSINGS) ×1 IMPLANT
TOWEL OR 17X24 6PK STRL BLUE (TOWEL DISPOSABLE) ×6 IMPLANT
TOWEL OR 17X26 10 PK STRL BLUE (TOWEL DISPOSABLE) ×6 IMPLANT
TRAY FOLEY IC TEMP SENS 16FR (CATHETERS) ×3 IMPLANT
TUBE FEEDING 8FR 16IN STR KANG (MISCELLANEOUS) ×3 IMPLANT
TUBING INSUFFLATION 10FT LAP (TUBING) ×3 IMPLANT
UNDERPAD 30X30 INCONTINENT (UNDERPADS AND DIAPERS) ×3 IMPLANT
WATER STERILE IRR 1000ML POUR (IV SOLUTION) ×6 IMPLANT

## 2013-04-18 NOTE — Transfer of Care (Signed)
Immediate Anesthesia Transfer of Care Note  Patient: Gary [name]  Procedure(s) Performed: Procedure(s): CORONARY ARTERY BYPASS GRAFTING (CABG) TIMES SIX USING LEFT INTERNAL MAMMARY ARTERY AND RIGHT SAPHENOUS LEG VEIN HARVESTED ENDOSCOPICALLY (N/A) INTRAOPERATIVE TRANSESOPHAGEAL ECHOCARDIOGRAM (N/A)  Patient Location: SICU  Anesthesia Type:General  Level of Consciousness: Patient remains intubated per anesthesia plan  Airway & Oxygen Therapy: Patient remains intubated per anesthesia plan and Patient placed on Ventilator (see vital sign flow sheet for setting)  Post-op Assessment: Report given to PACU RN and Post -op Vital signs reviewed and stable  Post vital signs: Reviewed and stable  Complications: No apparent anesthesia complications

## 2013-04-18 NOTE — Progress Notes (Signed)
EVENING ROUNDS NOTE :     [address]       [place] [address]             [phone]                 Day of Surgery Procedure(s) (LRB): CORONARY ARTERY BYPASS GRAFTING (CABG) TIMES SIX USING LEFT INTERNAL MAMMARY ARTERY AND RIGHT SAPHENOUS LEG VEIN HARVESTED ENDOSCOPICALLY (N/A) INTRAOPERATIVE TRANSESOPHAGEAL ECHOCARDIOGRAM (N/A)  Total Length of Stay:  LOS: 2 days  BP 101/73  Pulse 85  Temp(Src) 98.4 F (36.9 C) (Oral)  Resp 17  Ht 6\' 2"  (1.88 m)  Wt 285 lb 1.6 oz (129.321 kg)  BMI 36.59 kg/m2  SpO2 99%  .Intake/Output     04/09 0701 - 04/10 0700 04/10 0701 - 04/11 0700   P.O. 1080    I.V. (mL/kg)  2500 (19.3)   Blood  900   NG/GT  30   IV Piggyback  600   Total Intake(mL/kg) 1080 (8.4) 4030 (31.2)   Urine (mL/kg/hr)  4600 (3)   Blood  1685 (1.1)   Chest Tube  204 (0.1)   Total Output   6489   Net +1080 -2459        Urine Occurrence 3 x      . sodium chloride 20 mL/hr (04/18/13 1400)  . sodium chloride 20 mL/hr at 04/18/13 1800  . [START ON 04/19/2013] sodium chloride    . dexmedetomidine 0.1 mcg/kg/hr (04/18/13 1637)  . insulin (NOVOLIN-R) infusion 2 Units/hr (04/18/13 1800)  . lactated ringers 20 mL/hr (04/18/13 1530)  . nitroGLYCERIN 2 mcg/min (04/18/13 1600)  . phenylephrine (NEO-SYNEPHRINE) Adult infusion Stopped (04/18/13 1700)     Lab Results  Component Value Date   WBC 23.4* 04/18/2013   HGB 12.9* 04/18/2013   HCT 37.7* 04/18/2013   PLT 189 04/18/2013   GLUCOSE 112* 04/18/2013   ALT 21 04/17/2013   AST 18 04/17/2013   NA 138 04/18/2013   K 4.3 04/18/2013   CL 103 04/18/2013   CREATININE 1.22 04/18/2013   BUN 27* 04/18/2013   CO2 21 04/18/2013   INR 1.27 04/18/2013   HGBA1C 6.1* 04/17/2013   Now extubated, neuro intact Not bleeding [name] MD      [address] [place], [address] Office [phone]   [place]   [name] MD  Beeper [phone] Office [phone] 04/18/2013 6:44 PM

## 2013-04-18 NOTE — Plan of Care (Signed)
Problem: Phase II - Intermediate Post-Op Goal: Wean to Extubate Outcome: Completed/Met Date Met:  04/18/13 04/18/13 pt was extubated at 1747

## 2013-04-18 NOTE — CV Procedure (Signed)
Intra-operative Transesophageal Echocardiography Report:  Mr. Aarik Blank is a 58 -year-old male who presented with a three-month history of intermittent exertional chest discomfort. He subsequently had a exercise treadmill test which was strongly positive. Cardiac catheterization revealed severe three-vessel coronary disease with preserved left ventricular function. He is now scheduled to undergo coronary artery bypass grafting by Dr. Servando Snare. Intraoperative transesophageal echocardiography was requested to evaluate the left and right ventricular function, to assess for any valvular pathology, and to serve as a monitor for intraoperative volume status.  The patient was brought to the operating room and [facility] and general anesthesia was induced without difficulty. Following endotracheal intubation and orogastric suctioning, the transesophageal echocardiography probe was inserted into the esophagus without difficulty.  Impression: Pre-bypass findings:  1. Aortic valve: The aortic valve was trileaflet. The leaflets open normally without restriction. There was no aortic insufficiency. There was no thickening or calcification the leaflets noted.  2. Mitral valve: The mitral leaflets opened normally without restriction and coapted well without prolapsing  or flail segments. There was trace mitral insufficiency. There was no  thickening of the leaflets noted.  3. Left ventricle: Left ventricular cavity was within normal limits of size. There was a regional wall motion abnormality  in the in the region of the mid- inferior septum which appeared akinetic to dyskinetic. The remainder of the left ventricular segments appear to contract normally. The ejection fraction was estimated at 55-60%.  4. Right ventricle: The right ventricular size was within normal limits of size size and there was normal contractility of the right ventricular free wall and basilar right ventricular region  5. Tricuspid  Valve:  The tricuspid leaflets appeared structurally normal and there was trace tricuspid insufficiency.  6. Interatrial septum: Interatrial septum was intact without evidence of patent foramen ovale or atrial septal defect by color Doppler or bubble study.  7. Left atrium: There was no thrombus noted in the left atrium or left atrial appendage.  8. Descending aorta: There was a well-defined aortic root and sinotubular ridge without effacement. The proximal descending aorta measured 3.5 cm in diameter.   9. Descending aorta: There was mild intimal thickening of the descending aorta. there was no aneurysmal dilatation or atheromatous plaques noted. The diameter was 2.2 cm.  Post-bypass findings:  1. Aortic valve: The aortic valve appeared unchanged from the pre-bypass study. The leaflets opened normally without restriction and there was no aortic insufficiency.  2. Mitral valve: The mitral leaflets opened normally and there was trace mitral insufficiency. There were no prolapsing or flail segments noted.  3. Left ventricle: There was normal appearing contractility in all segments except in the mid-inferior septal region which appeared akinetic. The ejection fraction was estimated at 55-60%.  4. Right ventricle: Right ventricular size appeared normal and there was normal contractility the right ventricular free wall and right ventricular basal regions consistent with normal right ventricular function.  5. Tricuspid valve: The tricuspid valve appeared structurally normal and there was trace what tricuspid insufficiency   Roberts Gaudy, M.D.

## 2013-04-18 NOTE — Anesthesia Preprocedure Evaluation (Addendum)
Anesthesia Evaluation  Patient identified by MRN, date of birth, ID band Patient awake    Reviewed: Allergy & Precautions, H&P , NPO status , Patient's Chart, lab work & pertinent test results  History of Anesthesia Complications Negative for: history of anesthetic complications  Airway Mallampati: II TM Distance: >3 FB Neck ROM: Full    Dental  (+) Teeth Intact, Dental Advisory Given   Pulmonary former smoker,  Quit 20 years ago breath sounds clear to auscultation  Pulmonary exam normal       Cardiovascular hypertension, + angina with exertion + CAD Rhythm:Regular Rate:Normal     Neuro/Psych negative neurological ROS     GI/Hepatic negative GI ROS, Neg liver ROS,   Endo/Other  negative endocrine ROS  Renal/GU negative Renal ROS     Musculoskeletal   Abdominal Normal abdominal exam  (+) + obese,   Peds  Hematology negative hematology ROS (+)   Anesthesia Other Findings   Reproductive/Obstetrics                          Anesthesia Physical Anesthesia Plan  ASA: III  Anesthesia Plan: General   Post-op Pain Management:    Induction: Intravenous  Airway Management Planned: Oral ETT  Additional Equipment: Arterial line, CVP, PA Cath and TEE  Intra-op Plan:   Post-operative Plan: Post-operative intubation/ventilation  Informed Consent: I have reviewed the patients History and Physical, chart, labs and discussed the procedure including the risks, benefits and alternatives for the proposed anesthesia with the patient or authorized representative who has indicated his/her understanding and acceptance.   Dental advisory given  Plan Discussed with: CRNA, Anesthesiologist and Surgeon  Anesthesia Plan Comments:         Anesthesia Quick Evaluation

## 2013-04-18 NOTE — Brief Op Note (Addendum)
      [address]       , [address]             [phone]       04/18/2013  1:32 PM  PATIENT:  Gary [name]  58 y.o. male  PRE-OPERATIVE DIAGNOSIS:  Coronary Artery Disease unstable   POST-OPERATIVE DIAGNOSIS:  Coronary Artery Disease unstable  PROCEDURE: INTRAOPERATIVE TRANSESOPHAGEAL ECHOCARDIOGRAM, CORONARY ARTERY BYPASS GRAFTING (CABG) TIMES SIX (LIMA to LAD, SVG to DIAGONAL, SVG SEQUENTIALLY to OM1 and OM2, SVG SEQUENTIALLY to DISTAL CIRCUMFLEX and ACUTRE MARGINAL) USING LEFT INTERNAL MAMMARY ARTERY AND RIGHT SAPHENOUS LEG VEIN HARVESTED ENDOSCOPICALLY   SURGEON:  Surgeon(s) and Role:    * [name], MD - Primary  PHYSICIAN ASSISTANT: Lars Pinks PA-C    ANESTHESIA:   general  EBL:  Total I/O In: 3750 [I.V.:2500; Blood:900; IV Piggyback:350] Out: 3000 [Urine:3000]   DRAINS: Chest tubes placed in the mediastinal and pleural spaces   COUNTS CORRECT:  YES  DICTATION: .Dragon Dictation  PLAN OF CARE: Admit to inpatient   PATIENT DISPOSITION:  ICU - intubated and hemodynamically stable.   Delay start of Pharmacological VTE agent (>24hrs) due to surgical blood loss or risk of bleeding: yes  BASELINE WEIGHT: 128 kg

## 2013-04-18 NOTE — OR Nursing (Signed)
0825 chest incision 1884 first call made to SICU, 1320 second call  Made to SICU

## 2013-04-18 NOTE — Anesthesia Procedure Notes (Signed)
Procedure Name: Intubation Date/Time: 04/18/2013 7:43 AM Performed by: Izora Gala Pre-anesthesia Checklist: Patient identified, Emergency Drugs available, Suction available and Patient being monitored Patient Re-evaluated:Patient Re-evaluated prior to inductionOxygen Delivery Method: Circle system utilized Preoxygenation: Pre-oxygenation with 100% oxygen Intubation Type: IV induction Ventilation: Mask ventilation without difficulty and Two handed mask ventilation required Laryngoscope Size: Miller and 3 Grade View: Grade II Tube type: Oral Tube size: 8.5 mm Number of attempts: 1 Airway Equipment and Method: Stylet Placement Confirmation: ETT inserted through vocal cords under direct vision,  positive ETCO2 and breath sounds checked- equal and bilateral Secured at: 23 cm Tube secured with: Tape Dental Injury: Teeth and Oropharynx as per pre-operative assessment  Future Recommendations: Recommend- induction with short-acting agent, and alternative techniques readily available

## 2013-04-18 NOTE — Addendum Note (Signed)
Addendum created 04/18/13 2005 by Roberts Gaudy, MD   Modules edited: Clinical Notes   Clinical Notes:  File: [id number]; Pend: [id number]; Pend: [id number]; [place]: [id number]; [place]: [id number]

## 2013-04-18 NOTE — Progress Notes (Signed)
  Echocardiogram Echocardiogram Transesophageal has been performed.  Gary [name] 04/18/2013, 8:48 AM

## 2013-04-18 NOTE — Procedures (Signed)
Extubation Procedure Note  Patient Details:   Name: Gary [name] DOB: Aug 03, 1955 MRN: [id number]   Airway Documentation:     Evaluation  O2 sats: stable throughout Complications: No apparent complications Patient did tolerate procedure well. Bilateral Breath Sounds: Clear   Yes  Pt extubated to a 4lpm, VC 700, NIF -[place] 04/18/2013, 5:31 PM

## 2013-04-18 NOTE — Anesthesia Postprocedure Evaluation (Signed)
  Anesthesia Post-op Note  Patient: Gary [name]  Procedure(s) Performed: Procedure(s): CORONARY ARTERY BYPASS GRAFTING (CABG) TIMES SIX USING LEFT INTERNAL MAMMARY ARTERY AND RIGHT SAPHENOUS LEG VEIN HARVESTED ENDOSCOPICALLY (N/A) INTRAOPERATIVE TRANSESOPHAGEAL ECHOCARDIOGRAM (N/A)  Patient Location: SICU  Anesthesia Type:General  Level of Consciousness: sedated, unresponsive and Patient remains intubated per anesthesia plan  Airway and Oxygen Therapy: Patient remains intubated per anesthesia plan and Patient placed on Ventilator (see vital sign flow sheet for setting)  Post-op Pain: none  Post-op Assessment: Post-op Vital signs reviewed, Patient's Cardiovascular Status Stable, Respiratory Function Stable, Patent Airway and No signs of Nausea or vomiting  Post-op Vital Signs: stable  Last Vitals:  Filed Vitals:   04/18/13 1336  BP: 121/83  Pulse: 82  Temp:   Resp: 14    Complications: No apparent anesthesia complications

## 2013-04-19 ENCOUNTER — Inpatient Hospital Stay (HOSPITAL_COMMUNITY): Payer: BC Managed Care – PPO

## 2013-04-19 ENCOUNTER — Encounter (HOSPITAL_COMMUNITY): Payer: Self-pay | Admitting: *Deleted

## 2013-04-19 LAB — GLUCOSE, CAPILLARY
GLUCOSE-CAPILLARY: 109 mg/dL — AB (ref 70–99)
GLUCOSE-CAPILLARY: 118 mg/dL — AB (ref 70–99)
GLUCOSE-CAPILLARY: 132 mg/dL — AB (ref 70–99)
GLUCOSE-CAPILLARY: 132 mg/dL — AB (ref 70–99)
Glucose-Capillary: 101 mg/dL — ABNORMAL HIGH (ref 70–99)
Glucose-Capillary: 117 mg/dL — ABNORMAL HIGH (ref 70–99)
Glucose-Capillary: 111 mg/dL — ABNORMAL HIGH (ref 70–99)
Glucose-Capillary: 110 mg/dL — ABNORMAL HIGH (ref 70–99)
Glucose-Capillary: 110 mg/dL — ABNORMAL HIGH (ref 70–99)
Glucose-Capillary: 103 mg/dL — ABNORMAL HIGH (ref 70–99)
Glucose-Capillary: 97 mg/dL (ref 70–99)
Glucose-Capillary: 171 mg/dL — ABNORMAL HIGH (ref 70–99)

## 2013-04-19 LAB — BASIC METABOLIC PANEL
BUN: 21 mg/dL (ref 6–23)
BUN: 22 mg/dL (ref 6–23)
CHLORIDE: 104 meq/L (ref 96–112)
CO2: 22 mEq/L (ref 19–32)
CO2: 23 mEq/L (ref 19–32)
CREATININE: 1.01 mg/dL (ref 0.50–1.35)
Calcium: 8.3 mg/dL — ABNORMAL LOW (ref 8.4–10.5)
Calcium: 8.6 mg/dL (ref 8.4–10.5)
Chloride: 100 mEq/L (ref 96–112)
Creatinine, Ser: 0.99 mg/dL (ref 0.50–1.35)
GFR calc Af Amer: >90 mL/min (ref >90)
GFR calc Af Amer: >90 mL/min (ref >90)
GFR calc non Af Amer: 81 mL/min — ABNORMAL LOW (ref >90)
GFR calc non Af Amer: 90 mL/min — ABNORMAL LOW (ref >90)
Glucose, Bld: 117 mg/dL — ABNORMAL HIGH (ref 70–99)
Glucose, Bld: 172 mg/dL — ABNORMAL HIGH (ref 70–99)
Potassium: 4.5 mEq/L (ref 3.7–5.3)
Potassium: 4.4 mEq/L (ref 3.7–5.3)
Sodium: 140 mEq/L (ref 137–147)
Sodium: 135 mEq/L — ABNORMAL LOW (ref 137–147)

## 2013-04-19 LAB — CBC
HCT: 34.3 % — ABNORMAL LOW (ref 39.0–52.0)
HEMATOCRIT: 35 % — AB (ref 39.0–52.0)
Hemoglobin: 11.9 g/dL — ABNORMAL LOW (ref 13.0–17.0)
Hemoglobin: 11.7 g/dL — ABNORMAL LOW (ref 13.0–17.0)
MCH: 32.3 pg (ref 26.0–34.0)
MCH: 32.8 pg (ref 26.0–34.0)
MCHC: 34 g/dL (ref 30.0–36.0)
MCHC: 34.1 g/dL (ref 30.0–36.0)
MCV: 95.1 fL (ref 78.0–100.0)
MCV: 96.1 fL (ref 78.0–100.0)
Platelets: 189 10*3/uL (ref 150–400)
Platelets: 215 10*3/uL (ref 150–400)
RBC: 3.68 MIL/uL — ABNORMAL LOW (ref 4.22–5.81)
RBC: 3.57 MIL/uL — ABNORMAL LOW (ref 4.22–5.81)
RDW: 13.1 % (ref 11.5–15.5)
RDW: 13.4 % (ref 11.5–15.5)
WBC: 19.6 10*3/uL — AB (ref 4.0–10.5)
WBC: 18.4 10*3/uL — ABNORMAL HIGH (ref 4.0–10.5)

## 2013-04-19 LAB — CREATININE, SERUM
CREATININE: 0.98 mg/dL (ref 0.50–1.35)
GFR calc Af Amer: >90 mL/min (ref >90)

## 2013-04-19 LAB — POCT I-STAT, CHEM 8
BUN: 28 mg/dL — AB (ref 6–23)
CALCIUM ION: 1.16 mmol/L (ref 1.12–1.23)
CHLORIDE: 104 meq/L (ref 96–112)
Creatinine, Ser: 1.1 mg/dL (ref 0.50–1.35)
GLUCOSE: 109 mg/dL — AB (ref 70–99)
HCT: 34 % — ABNORMAL LOW (ref 39.0–52.0)
Hemoglobin: 11.6 g/dL — ABNORMAL LOW (ref 13.0–17.0)
Potassium: 5.6 mEq/L — ABNORMAL HIGH (ref 3.7–5.3)
Sodium: 137 mEq/L (ref 137–147)
TCO2: 27 mmol/L (ref 0–100)

## 2013-04-19 LAB — MAGNESIUM
MAGNESIUM: 2.3 mg/dL (ref 1.5–2.5)
Magnesium: 2.4 mg/dL (ref 1.5–2.5)

## 2013-04-19 MED ORDER — ENOXAPARIN SODIUM 40 MG/0.4ML ~~LOC~~ SOLN
40.0000 mg | Freq: Every day | SUBCUTANEOUS | Status: DC
  Administered 2013-04-19 – 2013-04-21 (×3): 40 mg via SUBCUTANEOUS
  Filled 2013-04-19 (×4): qty 0.4

## 2013-04-19 MED ORDER — INSULIN ASPART 100 UNIT/ML ~~LOC~~ SOLN
0.0000 [IU] | SUBCUTANEOUS | Status: DC
Start: 2013-04-19 — End: 2013-04-20
  Administered 2013-04-19: 4 [IU] via SUBCUTANEOUS
  Administered 2013-04-20: 08:00:00 via SUBCUTANEOUS

## 2013-04-19 NOTE — Progress Notes (Signed)
Patient ID: Gary [name], male   DOB: 12-25-1955, 58 y.o.   MRN: [id number] TCTS DAILY ICU PROGRESS NOTE                   [address]            [place] [address]          [phone]   1 Day Post-Op Procedure(s) (LRB): CORONARY ARTERY BYPASS GRAFTING (CABG) TIMES SIX USING LEFT INTERNAL MAMMARY ARTERY AND RIGHT SAPHENOUS LEG VEIN HARVESTED ENDOSCOPICALLY (N/A) INTRAOPERATIVE TRANSESOPHAGEAL ECHOCARDIOGRAM (N/A)  Total Length of Stay:  LOS: 3 days   Subjective: Awake and alert , neuro intact, good pain control  Objective: Vital signs in last 24 hours: Temp:  [95.5 F (35.3 C)-99.5 F (37.5 C)] 99.3 F (37.4 C) (04/11 0600) Pulse Rate:  [70-87] 73 (04/11 0600) Cardiac Rhythm:  [-] Normal sinus rhythm (04/11 0400) Resp:  [13-26] 16 (04/11 0600) BP: (85-128)/(46-91) 95/46 mmHg (04/11 0600) SpO2:  [91 %-100 %] 97 % (04/11 0600) FiO2 (%):  [40 %-50 %] 40 % (04/10 1638) Weight:  [293 lb 10.4 oz (133.2 kg)] 293 lb 10.4 oz (133.2 kg) (04/11 0500)  Filed Weights   04/16/13 0638 04/18/13 0406 04/19/13 0500  Weight: 283 lb (128.368 kg) 285 lb 1.6 oz (129.321 kg) 293 lb 10.4 oz (133.2 kg)    Weight change: 8 lb 8.8 oz (3.879 kg)   Hemodynamic parameters for last 24 hours: PAP: (23-41)/(11-26) 35/20 mmHg CO:  [4.7 L/min-7 L/min] 7 L/min CI:  [1.9 L/min/m2-2.8 L/min/m2] 2.8 L/min/m2  Intake/Output from previous day: 04/10 0701 - 04/11 0700 In: 5851.7 [I.V.:3421.7; Blood:900; NG/GT:30; IV Piggyback:1500] Out: 3149 [Urine:5630; Blood:1685; Chest Tube:629]  Intake/Output this shift:    Current Meds: Scheduled Meds: . acetaminophen  1,000 mg Oral 4 times per day   Or  . acetaminophen (TYLENOL) oral liquid 160 mg/5 mL  1,000 mg Per Tube 4 times per day  . allopurinol  400 mg Oral Daily  . aspirin EC  325 mg Oral Daily   Or  . aspirin  324 mg Per Tube Daily  . bisacodyl  10 mg Oral Daily   Or  . bisacodyl  10 mg Rectal Daily  . cefUROXime (ZINACEF)  IV   1.5 g Intravenous Q12H  . docusate sodium  200 mg Oral Daily  . famotidine (PEPCID) IV  20 mg Intravenous Q12H  . insulin regular  0-10 Units Intravenous TID WC  . metoprolol tartrate  12.5 mg Oral BID   Or  . metoprolol tartrate  12.5 mg Per Tube BID  . [START ON 04/20/2013] pantoprazole  40 mg Oral Daily  . sodium chloride  3 mL Intravenous Q12H  . tamsulosin  0.4 mg Oral Daily  . vitamin C  2,000 mg Oral Daily   Continuous Infusions: . sodium chloride 20 mL/hr at 04/18/13 1900  . sodium chloride 20 mL/hr at 04/18/13 1800  . sodium chloride    . dexmedetomidine Stopped (04/18/13 1637)  . insulin (NOVOLIN-R) infusion 1.8 mL/hr at 04/19/13 0600  . lactated ringers 20 mL/hr (04/18/13 1530)  . nitroGLYCERIN Stopped (04/18/13 1700)  . phenylephrine (NEO-SYNEPHRINE) Adult infusion Stopped (04/18/13 1700)   PRN Meds:.albumin human, metoprolol, midazolam, morphine injection, ondansetron (ZOFRAN) IV, oxyCODONE, sodium chloride  General appearance: alert, cooperative and no distress Neurologic: intact Heart: regular rate and rhythm, S1, S2 normal, no murmur, click, rub or gallop Lungs: diminished breath sounds bibasilar Abdomen: soft, non-tender; bowel sounds normal; no  masses,  no organomegaly Extremities: extremities normal, atraumatic, no cyanosis or edema and Homans sign is negative, no sign of DVT Wound: sternum, dressing intact  Lab Results: CBC: Recent Labs  04/18/13 2000 04/19/13 0359  WBC 21.4* 19.6*  HGB 12.7* 11.9*  HCT 36.1* 35.0*  PLT 232 189   BMET:  Recent Labs  04/18/13 0625  04/18/13 1958 04/18/13 2000 04/19/13 0359  NA 141  < > 138  --  140  K 4.1  < > 4.4  --  4.5  CL 103  --  104  --  104  CO2 21  --   --   --  22  GLUCOSE 83  < > 122*  --  117*  BUN 27*  --  19  --  21  CREATININE 1.22  --  1.10 0.92 1.01  CALCIUM 9.2  --   --   --  8.3*  < > = values in this interval not displayed.  PT/INR:  Recent Labs  04/18/13 1400  LABPROT 15.6*  INR  1.27   Radiology: Dg Chest Portable 1 View  04/18/2013   CLINICAL DATA:  Status post cardiac surgery, verify endotracheal tube and Swan-Ganz catheter positioning  EXAM: PORTABLE CHEST - 1 VIEW  COMPARISON:  DG CHEST 2 VIEW dated 04/15/2013  FINDINGS: The lung volumes are low. The endotracheal tube tip lies approximately 3.6 cm above the crotch of the carina. The Swan-Ganz type catheter placed via the right internal jugular Cordis sheath has its tip in the region of the proximal right main pulmonary artery. The esophagogastric tube tip and proximal port project off the film. A large caliber chest tube tip on the left overlies the lateral aspect of the third rib. There may be an additional chest tube tip projecting just inferior to the posterior aspect of the left eighth rib. A mediastinal drain is present whose tip lies over the left aspect of the T3 vertebral body.  The hypoinflated lungs demonstrate mild vascular crowding without definite infiltrates, but there is likely atelectasis in the left lower lobe obscuring the hemidiaphragm. The cardiopericardial silhouette is enlarged, and there is prominence of the mediastinum.  IMPRESSION: 1. The endotracheal tube, Swan-Ganz catheter tip, and other support tubes and lines appear to be in appropriate position. 2. There is bilateral pulmonary hyperinflation and likely left basilar atelectasis or infiltrate. 3. The cardiopericardial silhouette is enlarged.   Electronically Signed   By: Gary  [name]   On: 04/18/2013 15:25     Assessment/Plan: S/P Procedure(s) (LRB): CORONARY ARTERY BYPASS GRAFTING (CABG) TIMES SIX USING LEFT INTERNAL MAMMARY ARTERY AND RIGHT SAPHENOUS LEG VEIN HARVESTED ENDOSCOPICALLY (N/A) INTRAOPERATIVE TRANSESOPHAGEAL ECHOCARDIOGRAM (N/A) Mobilize Diuresis d/c tubes/lines Continue foley due to strict I&O See progression orders Expected Acute  Blood - loss Anemia    Gary [name] 04/19/2013 8:22 AM

## 2013-04-19 NOTE — Progress Notes (Signed)
Patient ID: Gary [name], male   DOB: 10-03-1955, 58 y.o.   MRN: [id number] EVENING ROUNDS NOTE :     [address]       [place], [address]             [phone]                 1 Day Post-Op Procedure(s) (LRB): CORONARY ARTERY BYPASS GRAFTING (CABG) TIMES SIX USING LEFT INTERNAL MAMMARY ARTERY AND RIGHT SAPHENOUS LEG VEIN HARVESTED ENDOSCOPICALLY (N/A) INTRAOPERATIVE TRANSESOPHAGEAL ECHOCARDIOGRAM (N/A)  Total Length of Stay:  LOS: 3 days  BP 100/66  Pulse 70  Temp(Src) 98 F (36.7 C) (Oral)  Resp 10  Ht 6\' 2"  (1.88 m)  Wt 293 lb 10.4 oz (133.2 kg)  BMI 37.69 kg/m2  SpO2 96%  .Intake/Output     04/11 0701 - 04/12 0700   P.O. 1080   I.V. (mL/kg) 274.7 (2.1)   Blood    NG/GT    IV Piggyback 50   Total Intake(mL/kg) 1404.7 (10.5)   Urine (mL/kg/hr) 430 (0.3)   Blood    Chest Tube 120 (0.1)   Total Output 550   Net +854.7         . sodium chloride Stopped (04/19/13 0900)  . sodium chloride 20 mL/hr at 04/18/13 1800  . sodium chloride    . dexmedetomidine Stopped (04/18/13 1637)  . insulin (NOVOLIN-R) infusion Stopped (04/19/13 1400)  . lactated ringers Stopped (04/19/13 1600)  . nitroGLYCERIN Stopped (04/18/13 1700)  . phenylephrine (NEO-SYNEPHRINE) Adult infusion Stopped (04/18/13 1700)     Lab Results  Component Value Date   WBC 18.4* 04/19/2013   HGB 11.7* 04/19/2013   HCT 34.3* 04/19/2013   PLT 215 04/19/2013   GLUCOSE 109* 04/19/2013   ALT 21 04/17/2013   AST 18 04/17/2013   NA 137 04/19/2013   K 5.6* 04/19/2013   CL 104 04/19/2013   CREATININE 0.98 04/19/2013   BUN 28* 04/19/2013   CO2 22 04/19/2013   INR 1.27 04/18/2013   HGBA1C 6.1* 04/17/2013   Repeat K was 5.6 Stable day uop adequate   [name] MD  Beeper [phone] Office [phone] 04/19/2013 7:35 PM

## 2013-04-20 ENCOUNTER — Inpatient Hospital Stay (HOSPITAL_COMMUNITY): Payer: BC Managed Care – PPO

## 2013-04-20 LAB — BASIC METABOLIC PANEL
BUN: 21 mg/dL (ref 6–23)
CO2: 24 mEq/L (ref 19–32)
Calcium: 8.5 mg/dL (ref 8.4–10.5)
Chloride: 104 mEq/L (ref 96–112)
Creatinine, Ser: 1.04 mg/dL (ref 0.50–1.35)
GFR calc Af Amer: >90 mL/min (ref >90)
GFR calc non Af Amer: 78 mL/min — ABNORMAL LOW (ref >90)
Glucose, Bld: 114 mg/dL — ABNORMAL HIGH (ref 70–99)
Potassium: 4.4 mEq/L (ref 3.7–5.3)
Sodium: 139 mEq/L (ref 137–147)

## 2013-04-20 LAB — GLUCOSE, CAPILLARY
GLUCOSE-CAPILLARY: 106 mg/dL — AB (ref 70–99)
GLUCOSE-CAPILLARY: 116 mg/dL — AB (ref 70–99)
GLUCOSE-CAPILLARY: 108 mg/dL — AB (ref 70–99)
GLUCOSE-CAPILLARY: 101 mg/dL — AB (ref 70–99)
GLUCOSE-CAPILLARY: 129 mg/dL — AB (ref 70–99)
GLUCOSE-CAPILLARY: 146 mg/dL — AB (ref 70–99)
GLUCOSE-CAPILLARY: 125 mg/dL — AB (ref 70–99)
GLUCOSE-CAPILLARY: 127 mg/dL — AB (ref 70–99)
Glucose-Capillary: 105 mg/dL — ABNORMAL HIGH (ref 70–99)
Glucose-Capillary: 105 mg/dL — ABNORMAL HIGH (ref 70–99)
Glucose-Capillary: 106 mg/dL — ABNORMAL HIGH (ref 70–99)
Glucose-Capillary: 106 mg/dL — ABNORMAL HIGH (ref 70–99)
Glucose-Capillary: 110 mg/dL — ABNORMAL HIGH (ref 70–99)
Glucose-Capillary: 111 mg/dL — ABNORMAL HIGH (ref 70–99)
Glucose-Capillary: 113 mg/dL — ABNORMAL HIGH (ref 70–99)
Glucose-Capillary: 115 mg/dL — ABNORMAL HIGH (ref 70–99)
Glucose-Capillary: 118 mg/dL — ABNORMAL HIGH (ref 70–99)
Glucose-Capillary: 122 mg/dL — ABNORMAL HIGH (ref 70–99)
Glucose-Capillary: 124 mg/dL — ABNORMAL HIGH (ref 70–99)
Glucose-Capillary: 141 mg/dL — ABNORMAL HIGH (ref 70–99)

## 2013-04-20 LAB — CBC
HCT: 32.5 % — ABNORMAL LOW (ref 39.0–52.0)
Hemoglobin: 10.8 g/dL — ABNORMAL LOW (ref 13.0–17.0)
MCH: 32.1 pg (ref 26.0–34.0)
MCHC: 33.2 g/dL (ref 30.0–36.0)
MCV: 96.7 fL (ref 78.0–100.0)
Platelets: 173 10*3/uL (ref 150–400)
RBC: 3.36 MIL/uL — ABNORMAL LOW (ref 4.22–5.81)
RDW: 13.4 % (ref 11.5–15.5)
WBC: 16.3 10*3/uL — ABNORMAL HIGH (ref 4.0–10.5)

## 2013-04-20 LAB — TSH: TSH: 0.997 u[IU]/mL (ref 0.350–4.500)

## 2013-04-20 MED ORDER — FUROSEMIDE 10 MG/ML IJ SOLN
40.0000 mg | Freq: Once | INTRAMUSCULAR | Status: AC
  Administered 2013-04-20: 40 mg via INTRAVENOUS
  Filled 2013-04-20: qty 4

## 2013-04-20 MED ORDER — AMIODARONE LOAD VIA INFUSION
150.0000 mg | Freq: Once | INTRAVENOUS | Status: AC
  Administered 2013-04-20: 150 mg via INTRAVENOUS
  Filled 2013-04-20: qty 83.34

## 2013-04-20 MED ORDER — MOVING RIGHT ALONG BOOK
Freq: Once | Status: AC
  Administered 2013-04-20: 12:00:00
  Filled 2013-04-20: qty 1

## 2013-04-20 MED ORDER — INSULIN ASPART 100 UNIT/ML ~~LOC~~ SOLN: 0.0000 [IU] | Freq: Three times a day (TID) | SUBCUTANEOUS | Status: DC

## 2013-04-20 MED ORDER — INSULIN ASPART 100 UNIT/ML ~~LOC~~ SOLN
0.0000 [IU] | SUBCUTANEOUS | Status: DC
  Administered 2013-04-20: 2 [IU] via SUBCUTANEOUS

## 2013-04-20 MED ORDER — AMIODARONE HCL IN DEXTROSE 360-4.14 MG/200ML-% IV SOLN
30.0000 mg/h | INTRAVENOUS | Status: DC
  Administered 2013-04-20 – 2013-04-22 (×4): 30 mg/h via INTRAVENOUS
  Filled 2013-04-20 (×12): qty 200

## 2013-04-20 MED ORDER — INSULIN ASPART 100 UNIT/ML ~~LOC~~ SOLN
0.0000 [IU] | Freq: Three times a day (TID) | SUBCUTANEOUS | Status: DC
  Administered 2013-04-20 – 2013-04-21 (×3): 2 [IU] via SUBCUTANEOUS

## 2013-04-20 MED ORDER — AMIODARONE HCL IN DEXTROSE 360-4.14 MG/200ML-% IV SOLN
60.0000 mg/h | INTRAVENOUS | Status: AC
  Administered 2013-04-20 (×2): 60 mg/h via INTRAVENOUS
  Filled 2013-04-20: qty 200

## 2013-04-20 MED ORDER — AMIODARONE IV BOLUS ONLY 150 MG/100ML
150.0000 mg | Freq: Once | INTRAVENOUS | Status: AC
  Administered 2013-04-20: 150 mg via INTRAVENOUS

## 2013-04-20 NOTE — Progress Notes (Signed)
  Amiodarone Drug - Drug Interaction Consult Note  Amiodarone is metabolized by the cytochrome P450 system and therefore has the potential to cause many drug interactions. Amiodarone has an average plasma half-life of 50 days (range 20 to 100 days).   There is potential for drug interactions to occur several weeks or months after stopping treatment and the onset of drug interactions may be slow after initiating amiodarone.   [x]  Beta blockers: increased risk of bradycardia, AV block and myocardial depression. Sotalol - avoid concomitant use.  [x]  Diuretics: increased risk of cardiotoxicity if hypokalemia occurs.  Thank You,  Erik Obey  04/20/2013 9:10 AM

## 2013-04-20 NOTE — Progress Notes (Signed)
Patient ID: Gary [name], male   DOB: [birth year], 58 y.o.   MRN: [id number] TCTS DAILY ICU PROGRESS NOTE                   [address]            [place] [address]          [phone]   2 Days Post-Op Procedure(s) (LRB): CORONARY ARTERY BYPASS GRAFTING (CABG) TIMES SIX USING LEFT INTERNAL MAMMARY ARTERY AND RIGHT SAPHENOUS LEG VEIN HARVESTED ENDOSCOPICALLY (N/A) INTRAOPERATIVE TRANSESOPHAGEAL ECHOCARDIOGRAM (N/A)  Total Length of Stay:  LOS: 4 days   Subjective: Rapid Afib early this am, started on Cordarone drip  Objective: Vital signs in last 24 hours: Temp:  [97.4 F (36.3 C)-99.5 F (37.5 C)] 97.4 F (36.3 C) (04/12 0741) Pulse Rate:  [60-111] 111 (04/12 0600) Cardiac Rhythm:  [-] Atrial fibrillation (04/12 0600) Resp:  [10-19] 10 (04/12 0600) BP: (96-124)/(53-93) 109/70 mmHg (04/12 0600) SpO2:  [95 %-100 %] 96 % (04/12 0600) Weight:  [291 lb 14.2 oz (132.4 kg)] 291 lb 14.2 oz (132.4 kg) (04/12 0500)  Filed Weights   04/18/13 0406 04/19/13 0500 04/20/13 0500  Weight: 285 lb 1.6 oz (129.321 kg) 293 lb 10.4 oz (133.2 kg) 291 lb 14.2 oz (132.4 kg)    Weight change: -1 lb 12.2 oz (-0.8 kg)   Hemodynamic parameters for last 24 hours: PAP: (31)/(13) 31/13 mmHg  Intake/Output from previous day: 04/11 0701 - 04/12 0700 In: 1674.7 [P.O.:1080; I.V.:494.7; IV Piggyback:100] Out: 995 [Urine:845; Chest Tube:150]  Intake/Output this shift:    Current Meds: Scheduled Meds: . acetaminophen  1,000 mg Oral 4 times per day   Or  . acetaminophen (TYLENOL) oral liquid 160 mg/5 mL  1,000 mg Per Tube 4 times per day  . allopurinol  400 mg Oral Daily  . aspirin EC  325 mg Oral Daily   Or  . aspirin  324 mg Per Tube Daily  . bisacodyl  10 mg Oral Daily   Or  . bisacodyl  10 mg Rectal Daily  . cefUROXime (ZINACEF)  IV  1.5 g Intravenous Q12H  . docusate sodium  200 mg Oral Daily  . enoxaparin (LOVENOX) injection  40 mg Subcutaneous QHS  . furosemide  40  mg Intravenous Once  . insulin aspart  0-24 Units Subcutaneous 6 times per day  . metoprolol tartrate  12.5 mg Oral BID   Or  . metoprolol tartrate  12.5 mg Per Tube BID  . pantoprazole  40 mg Oral Daily  . sodium chloride  3 mL Intravenous Q12H  . tamsulosin  0.4 mg Oral Daily  . vitamin C  2,000 mg Oral Daily   Continuous Infusions: . sodium chloride Stopped (04/19/13 0900)  . sodium chloride 20 mL/hr at 04/18/13 1800  . sodium chloride    . amiodarone (NEXTERONE PREMIX) 360 mg/200 mL dextrose 60 mg/hr (04/20/13 0612)   Followed by  . amiodarone (NEXTERONE PREMIX) 360 mg/200 mL dextrose    . lactated ringers 20 mL/hr at 04/19/13 2000  . nitroGLYCERIN Stopped (04/18/13 1700)  . phenylephrine (NEO-SYNEPHRINE) Adult infusion Stopped (04/18/13 1700)   PRN Meds:.metoprolol, morphine injection, ondansetron (ZOFRAN) IV, oxyCODONE, sodium chloride  General appearance: alert, cooperative and no distress Neurologic: intact Heart: irregularly irregular rhythm Lungs: diminished breath sounds bibasilar Abdomen: soft, non-tender; bowel sounds normal; no masses,  no organomegaly Extremities: extremities normal, atraumatic, no cyanosis or edema and Homans sign is negative, no sign of  DVT Wound: sternum stable  Lab Results: CBC: Recent Labs  04/19/13 1635 04/20/13 0355  WBC 18.4* 16.3*  HGB 11.7* 10.8*  HCT 34.3* 32.5*  PLT 215 173   BMET:  Recent Labs  04/19/13 1930 04/20/13 0355  NA 135* 139  K 4.4 4.4  CL 100 104  CO2 23 24  GLUCOSE 172* 114*  BUN 22 21  CREATININE 0.99 1.04  CALCIUM 8.6 8.5    PT/INR:  Recent Labs  04/18/13 1400  LABPROT 15.6*  INR 1.27   Radiology: Dg Chest Portable 1 View In Am  04/19/2013   CLINICAL DATA:  Status post CABG  EXAM: PORTABLE CHEST - 1 VIEW  COMPARISON:  DG CHEST 1V PORT dated 04/18/2013  FINDINGS: The patient has undergone interval extubation of the trachea and esophagus. The Swan-Ganz catheter tip lies in the region of the right  main pulmonary artery. The left-sided chest tube tip is unchanged in the upper hemithorax. A mediastinal drain on the left is also unchanged. The cardiopericardial silhouette is mildly enlarged but stable. The pulmonary vascularity is not engorged. The lung volumes are low but the interstitial markings appear normal. The left hemidiaphragm remains obscured.  IMPRESSION: There is persistent left lower lobe atelectasis and/or small pleural effusion. The pulmonary interstitium has improved with decreased interstitial edema. The support tubes as described above appear stable. There has been interval extubation of the trachea and esophagus.   Electronically Signed   By: David  Martinique   On: 04/19/2013 09:11   Dg Chest Portable 1 View  04/18/2013   CLINICAL DATA:  Status post cardiac surgery, verify endotracheal tube and Swan-Ganz catheter positioning  EXAM: PORTABLE CHEST - 1 VIEW  COMPARISON:  DG CHEST 2 VIEW dated 04/15/2013  FINDINGS: The lung volumes are low. The endotracheal tube tip lies approximately 3.6 cm above the crotch of the carina. The Swan-Ganz type catheter placed via the right internal jugular Cordis sheath has its tip in the region of the proximal right main pulmonary artery. The esophagogastric tube tip and proximal port project off the film. A large caliber chest tube tip on the left overlies the lateral aspect of the third rib. There may be an additional chest tube tip projecting just inferior to the posterior aspect of the left eighth rib. A mediastinal drain is present whose tip lies over the left aspect of the T3 vertebral body.  The hypoinflated lungs demonstrate mild vascular crowding without definite infiltrates, but there is likely atelectasis in the left lower lobe obscuring the hemidiaphragm. The cardiopericardial silhouette is enlarged, and there is prominence of the mediastinum.  IMPRESSION: 1. The endotracheal tube, Swan-Ganz catheter tip, and other support tubes and lines appear to be in  appropriate position. 2. There is bilateral pulmonary hyperinflation and likely left basilar atelectasis or infiltrate. 3. The cardiopericardial silhouette is enlarged.   Electronically Signed   By: David  Martinique   On: 04/18/2013 15:25     Assessment/Plan: S/P Procedure(s) (LRB): CORONARY ARTERY BYPASS GRAFTING (CABG) TIMES SIX USING LEFT INTERNAL MAMMARY ARTERY AND RIGHT SAPHENOUS LEG VEIN HARVESTED ENDOSCOPICALLY (N/A) INTRAOPERATIVE TRANSESOPHAGEAL ECHOCARDIOGRAM (N/A) Mobilize Diuresis d/c tubes/lines continue iv cordarone, wait on transfer until afib controlled     [name] 04/20/2013 8:10 AM

## 2013-04-21 ENCOUNTER — Encounter (HOSPITAL_COMMUNITY): Payer: Self-pay | Admitting: Cardiothoracic Surgery

## 2013-04-21 LAB — GLUCOSE, CAPILLARY
GLUCOSE-CAPILLARY: 123 mg/dL — AB (ref 70–99)
Glucose-Capillary: 112 mg/dL — ABNORMAL HIGH (ref 70–99)
Glucose-Capillary: 102 mg/dL — ABNORMAL HIGH (ref 70–99)
Glucose-Capillary: 108 mg/dL — ABNORMAL HIGH (ref 70–99)

## 2013-04-21 LAB — BASIC METABOLIC PANEL
BUN: 17 mg/dL (ref 6–23)
CO2: 28 mEq/L (ref 19–32)
Calcium: 8.7 mg/dL (ref 8.4–10.5)
Chloride: 101 mEq/L (ref 96–112)
Creatinine, Ser: 1 mg/dL (ref 0.50–1.35)
GFR calc Af Amer: >90 mL/min (ref >90)
GFR calc non Af Amer: 82 mL/min — ABNORMAL LOW (ref >90)
Glucose, Bld: 107 mg/dL — ABNORMAL HIGH (ref 70–99)
Potassium: 3.7 mEq/L (ref 3.7–5.3)
Sodium: 139 mEq/L (ref 137–147)

## 2013-04-21 LAB — CBC
HCT: 32.2 % — ABNORMAL LOW (ref 39.0–52.0)
Hemoglobin: 10.8 g/dL — ABNORMAL LOW (ref 13.0–17.0)
MCH: 32 pg (ref 26.0–34.0)
MCHC: 33.5 g/dL (ref 30.0–36.0)
MCV: 95.5 fL (ref 78.0–100.0)
Platelets: 184 10*3/uL (ref 150–400)
RBC: 3.37 MIL/uL — ABNORMAL LOW (ref 4.22–5.81)
RDW: 13.2 % (ref 11.5–15.5)
WBC: 12.3 10*3/uL — ABNORMAL HIGH (ref 4.0–10.5)

## 2013-04-21 MED ORDER — POTASSIUM CHLORIDE 10 MEQ/50ML IV SOLN
10.0000 meq | INTRAVENOUS | Status: AC | PRN
  Administered 2013-04-21 (×3): 10 meq via INTRAVENOUS
  Filled 2013-04-21 (×3): qty 50

## 2013-04-21 MED ORDER — FUROSEMIDE 10 MG/ML IJ SOLN
40.0000 mg | Freq: Once | INTRAMUSCULAR | Status: AC
  Administered 2013-04-21: 40 mg via INTRAVENOUS
  Filled 2013-04-21: qty 4

## 2013-04-21 MED ORDER — AMIODARONE IV BOLUS ONLY 150 MG/100ML: 150.0000 mg | Freq: Once | INTRAVENOUS | Status: DC

## 2013-04-21 MED ORDER — AMIODARONE LOAD VIA INFUSION
150.0000 mg | Freq: Once | INTRAVENOUS | Status: AC
  Administered 2013-04-21: 150 mg via INTRAVENOUS
  Filled 2013-04-21: qty 83.34

## 2013-04-21 MED FILL — Lidocaine HCl IV Inj 20 MG/ML: INTRAVENOUS | Qty: 5 | Status: AC

## 2013-04-21 MED FILL — Heparin Sodium (Porcine) Inj 1000 Unit/ML: INTRAMUSCULAR | Qty: 2 | Status: AC

## 2013-04-21 MED FILL — Mannitol IV Soln 20%: INTRAVENOUS | Qty: 500 | Status: AC

## 2013-04-21 MED FILL — Sodium Bicarbonate IV Soln 8.4%: INTRAVENOUS | Qty: 50 | Status: AC

## 2013-04-21 MED FILL — Electrolyte-R (PH 7.4) Solution: INTRAVENOUS | Qty: 1000 | Status: AC

## 2013-04-21 MED FILL — Sodium Chloride IV Soln 0.9%: INTRAVENOUS | Qty: 1000 | Status: AC

## 2013-04-21 NOTE — Op Note (Signed)
NAME:  Gary [name], Gary [name] NO.:  [id number]  MEDICAL RECORD NO.:  [id number]  LOCATION:  O                            FACILITY:  [facility]  PHYSICIAN:  Lanelle Bal, MD    DATE OF BIRTH:  April 17, 1955  DATE OF PROCEDURE:  04/18/2013 DATE OF DISCHARGE:                              OPERATIVE REPORT   PREOPERATIVE DIAGNOSIS:  Unstable angina.  POSTOPERATIVE DIAGNOSIS:  Unstable angina.  SURGICAL PROCEDURE:  Coronary artery bypass grafting x6 with the left internal mammary to the distal left anterior descending coronary artery, reverse saphenous vein graft to the diagonal coronary artery, reverse saphenous vein graft to the first and second obtuse marginal, reverse saphenous vein graft sequentially to the acute marginal and distal circumflex, with right leg endo vein harvesting.  SURGEON:  Lanelle Bal, MD  FIRST ASSISTANT:  Lars Pinks, PA  BRIEF HISTORY:  The patient is a 58 year old male who presents with new onset of angina with minimal exertion associated with shortness of breath.  He had noted increasing episodes of symptoms, he mentioned to his medical doctor.  A treadmill test was positive and he underwent cardiac catheterization by Dr. Wynonia Lawman and was admitted for severe three- vessel coronary artery disease.  Because of the complex nature of his disease, coronary artery bypass grafting was recommended to the patient who agreed and signed informed consent.  Risks and options were discussed with the patient and he was agreeable with proceeding.  DESCRIPTION OF THE PROCEDURE:  The patient underwent general endotracheal anesthesia without incidence and the chest and legs were prepped with Betadine and draped in a sterile manner.  TEE probe was placed by Dr. Wynetta Emery.  The findings in separate note.  Appropriate time- out was performed and the patient underwent endo vein harvesting of vein from the right thigh and calf, was of good quality and caliber.   Median sternotomy was performed.  Left internal mammary artery was dissected down as a pedicle graft.  The distal artery was divided, had good free flow.  Pericardium was opened.  Overall ventricular function appeared preserved.  The patient was systemically heparinized.  Ascending aorta was cannulated with a 22-French cannula.  A dual stage right venous cannula was placed.  The patient was placed on cardiopulmonary bypass, 2.4 L/min/m2.  Sites of anastomosis were selected and dissected out of the epicardium.  The patient's body temperature was cooled to 32 degrees.  An aortic cross-clamp was applied and 500 mL of cold blood potassium cardioplegia was administered with diastolic arrest of the heart.  The myocardial septal temperatures were monitored throughout the cross-clamp.  The heart was elevated and the first and second obtuse marginal supplying the lateral wall were easily identified.  A smaller intermediate branch was also identified but had no significant disease and was not bypassed.  The first obtuse marginal artery was opened and admitted a 1.5-mm probe distally.  Using a running 8-0 Prolene, a side- to-side anastomosis was carried out with a segment of reverse saphenous vein graft.  Distal extent of the same vein was then carried short distance to the second obtuse marginal which was similar sized vessel and using a running 8-0 Prolene, distal  anastomosis was performed. Attention was then turned to the diagonal coronary artery, which was totally occluded.  Preoperatively, the vessel was opened and admitted a 1.5-mm probe distally.  Using a running 7-0 Prolene, distal anastomosis was performed.  Attention was then turned to the right system, the patient had a small posterior descending but a large acute marginal with a proximal stenosis, this vessel was identified and opened using a diamond-type side-to-side anastomosis with a running 7-0 Prolene, segment of reverse saphenous  vein graft was anastomosed to the acute marginal.  The distal extent of the same vein was then carried to the distal circumflex branch which was 1.2 to 1.3 mm in size.  Using a running 7-0 Prolene, distal anastomosis was performed.  Intermittently cold blood cardioplegia was administered down the vein grafts. Attention was then turned to the left anterior descending coronary artery.  The mid third of the vessel was totally occluded, just distal to this vessel was opened and admitted a 1-mm probe distally.  The vessel was approximately 1.2 to 1.3 mm in size.  Using a running 8-0 Prolene, the left internal mammary artery was anastomosed to the distal left anterior descending coronary artery.  With the crossclamp still in place, three punch aortotomies were performed and each of the 3 vein grafts anastomosed to the ascending aorta.  Air was evacuated from the grafts and aortic cross-clamp was removed.  Total crossclamp time 129 minutes.  The patient required electric defibrillation and returned to sinus rhythm.  He was atrially paced to increase rate.  Sites anastomosed were inspected and free of bleeding.  He was then ventilated and weaned from cardiopulmonary bypass without difficulty.  He remained hemodynamically stable, he was decannulated in usual fashion.  Protamine sulfate was administered.  With the operative field hemostatic, a left pleural tube and a Blake mediastinal drain were left in place.  Sternum was closed with #6 stainless steel wire.  Fascia was closed with interrupted 0 Vicryl and 3-0 Vicryl and subcutaneous tissue 4-0 subcuticular stitch in skin edges.  Dry dressings were applied.  Sponge and needle count was reported as correct at completion of the procedure. The patient tolerated the procedure without obvious complication and was transferred to Surgical Intensive Care Unit for further postoperative care.  He did not require any blood bank blood products during  the operative procedure.  Total pump time was 158 minutes.     Lanelle Bal, MD     EG/MEDQ  D:  04/20/2013  T:  04/20/2013  Job:  [id number]

## 2013-04-21 NOTE — Progress Notes (Signed)
Patient ID: Gary [name], male   DOB: [birth year], 58 y.o.   MRN: [id number] TCTS DAILY ICU PROGRESS NOTE                   [address]            [place] [address]          [phone]   3 Days Post-Op Procedure(s) (LRB): CORONARY ARTERY BYPASS GRAFTING (CABG) TIMES SIX USING LEFT INTERNAL MAMMARY ARTERY AND RIGHT SAPHENOUS LEG VEIN HARVESTED ENDOSCOPICALLY (N/A) INTRAOPERATIVE TRANSESOPHAGEAL ECHOCARDIOGRAM (N/A)  Total Length of Stay:  LOS: 5 days   Subjective: Up in chair, still afib x 24 hours on amiodarone drip  Objective: Vital signs in last 24 hours: Temp:  [97.4 F (36.3 C)-98.4 F (36.9 C)] 98.2 F (36.8 C) (04/13 0400) Pulse Rate:  [95-118] 101 (04/13 0700) Cardiac Rhythm:  [-] Atrial fibrillation (04/13 0400) Resp:  [11-28] 17 (04/13 0700) BP: (93-126)/(59-76) 97/59 mmHg (04/13 0700) SpO2:  [92 %-98 %] 94 % (04/13 0700) Weight:  [288 lb 12.8 oz (131 kg)] 288 lb 12.8 oz (131 kg) (04/13 0500)  Filed Weights   04/19/13 0500 04/20/13 0500 04/21/13 0500  Weight: 293 lb 10.4 oz (133.2 kg) 291 lb 14.2 oz (132.4 kg) 288 lb 12.8 oz (131 kg)    Weight change: -3 lb 1.4 oz (-1.4 kg)   Hemodynamic parameters for last 24 hours:    Intake/Output from previous day: 04/12 0701 - 04/13 0700 In: 1073.8 [I.V.:1023.8; IV Piggyback:50] Out: 2545 [Urine:2525; Chest Tube:20]  Intake/Output this shift:    Current Meds: Scheduled Meds: . allopurinol  400 mg Oral Daily  . aspirin EC  325 mg Oral Daily   Or  . aspirin  324 mg Per Tube Daily  . bisacodyl  10 mg Oral Daily   Or  . bisacodyl  10 mg Rectal Daily  . docusate sodium  200 mg Oral Daily  . enoxaparin (LOVENOX) injection  40 mg Subcutaneous QHS  . insulin aspart  0-24 Units Subcutaneous TID WC & HS  . metoprolol tartrate  12.5 mg Oral BID   Or  . metoprolol tartrate  12.5 mg Per Tube BID  . pantoprazole  40 mg Oral Daily  . sodium chloride  3 mL Intravenous Q12H  . tamsulosin  0.4 mg Oral  Daily  . vitamin C  2,000 mg Oral Daily   Continuous Infusions: . sodium chloride Stopped (04/19/13 0900)  . sodium chloride 20 mL/hr at 04/18/13 1800  . sodium chloride    . amiodarone (NEXTERONE PREMIX) 360 mg/200 mL dextrose 30 mg/hr (04/21/13 0105)  . lactated ringers 20 mL/hr at 04/19/13 2000  . nitroGLYCERIN Stopped (04/18/13 1700)  . phenylephrine (NEO-SYNEPHRINE) Adult infusion Stopped (04/18/13 1700)   PRN Meds:.metoprolol, morphine injection, ondansetron (ZOFRAN) IV, oxyCODONE, potassium chloride, sodium chloride  General appearance: alert and cooperative Neurologic: intact Heart: irregularly irregular rhythm Lungs: diminished breath sounds bibasilar Abdomen: soft, non-tender; bowel sounds normal; no masses,  no organomegaly Extremities: extremities normal, atraumatic, no cyanosis or edema and Homans sign is negative, no sign of DVT  Lab Results: CBC: Recent Labs  04/20/13 0355 04/21/13 0444  WBC 16.3* 12.3*  HGB 10.8* 10.8*  HCT 32.5* 32.2*  PLT 173 184   BMET:  Recent Labs  04/20/13 0355 04/21/13 0444  NA 139 139  K 4.4 3.7  CL 104 101  CO2 24 28  GLUCOSE 114* 107*  BUN 21 17  CREATININE 1.04 1.00  CALCIUM 8.5 8.7    PT/INR:  Recent Labs  04/18/13 1400  LABPROT 15.6*  INR 1.27   Radiology: Dg Chest Port 1 View  04/20/2013   CLINICAL DATA:  Postop cardiac surgery, chest tube evaluation  EXAM: PORTABLE CHEST - 1 VIEW  COMPARISON:  DG CHEST 1V PORT dated 04/19/2013; DG CHEST 1V PORT dated 04/18/2013; DG CHEST 2 VIEW dated 04/15/2013  FINDINGS: Grossly unchanged enlarged cardiac silhouette and mediastinal contours post median sternotomy and CABG. Lung volumes remain persistently reduced. Interval removal of right jugular approach PA catheter with remaining vascular sheath tip overlying the superior SVC. Interval removal of a mediastinal drain. Unchanged positioning of left-sided chest tube. No pneumothorax. Unchanged small left-sided effusion with associated  left basilar heterogeneous/consolidative opacities. No new focal airspace opacities. No evidence of edema. Unchanged bones.  IMPRESSION: 1. Interval removal of support apparatus as above.  No pneumothorax. 2. Unchanged small left-sided effusion and associated left basilar atelectasis. 3. No definite evidence of edema.   Electronically Signed   By: Sandi Mariscal M.D.   On: 04/20/2013 09:03     Assessment/Plan: S/P Procedure(s) (LRB): CORONARY ARTERY BYPASS GRAFTING (CABG) TIMES SIX USING LEFT INTERNAL MAMMARY ARTERY AND RIGHT SAPHENOUS LEG VEIN HARVESTED ENDOSCOPICALLY (N/A) INTRAOPERATIVE TRANSESOPHAGEAL ECHOCARDIOGRAM (N/A) Mobilize Diuresis Unable to apace  re bolus cordarone , cardiology to see early cardioversion, currently not on coumadin    Gary [name] 04/21/2013 7:29 AM

## 2013-04-21 NOTE — Progress Notes (Signed)
TCTS BRIEF SICU PROGRESS NOTE  3 Days Post-Op  S/P Procedure(s) (LRB): CORONARY ARTERY BYPASS GRAFTING (CABG) TIMES SIX USING LEFT INTERNAL MAMMARY ARTERY AND RIGHT SAPHENOUS LEG VEIN HARVESTED ENDOSCOPICALLY (N/A) INTRAOPERATIVE TRANSESOPHAGEAL ECHOCARDIOGRAM (N/A)   Stable day Maintaining NSR all afternoon  Plan: Continue current plan  Rexene Alberts 04/21/2013 6:38 PM

## 2013-04-21 NOTE — Progress Notes (Signed)
Subjective:  He is feeling weak and had been in atrial fib past day.  Just converted to NSR at 7:57 am.    Objective:  Vital Signs in the last 24 hours: BP 97/59  Pulse 85  Temp(Src) 98.6 F (37 C) (Oral)  Resp 21  Ht 6\' 2"  (1.88 m)  Wt 131 kg (288 lb 12.8 oz)  BMI 37.06 kg/m2  SpO2 96%  Physical Exam: Pleasant WM in NAD Lungs:  Reduced breath sounds Cardiac:  Regular rhythm, normal S1 and S2, no S3 Abdomen:  Soft, nontender, no masses Extremities:  1+ edema present  Intake/Output from previous day: 04/12 0701 - 04/13 0700 In: 1110.5 [I.V.:1060.5; IV Piggyback:50] Out: 2545 [Urine:2525; Chest Tube:20] Weight Filed Weights   04/19/13 0500 04/20/13 0500 04/21/13 0500  Weight: 133.2 kg (293 lb 10.4 oz) 132.4 kg (291 lb 14.2 oz) 131 kg (288 lb 12.8 oz)   Lab Results: Basic Metabolic Panel:  Recent Labs  04/20/13 0355 04/21/13 0444  NA 139 139  K 4.4 3.7  CL 104 101  CO2 24 28  GLUCOSE 114* 107*  BUN 21 17  CREATININE 1.04 1.00   CBC:  Recent Labs  04/20/13 0355 04/21/13 0444  WBC 16.3* 12.3*  HGB 10.8* 10.8*  HCT 32.5* 32.2*  MCV 96.7 95.5  PLT 173 184   Telemetry: Currently NSR  Assessment/Plan:  1. Post op a fib now in NSR.  At this point continue amiodarone and beta blocker.  No value in cardioversion at this point as in NSR.  He may swing back and forth with a fib due to inflammation.  2. CAD 3. Mild elevation of blood sugar  W. Doristine Church  MD [facility]  04/21/2013, 9:01 AM

## 2013-04-22 LAB — BASIC METABOLIC PANEL
BUN: 21 mg/dL (ref 6–23)
CO2: 29 mEq/L (ref 19–32)
Calcium: 8.7 mg/dL (ref 8.4–10.5)
Chloride: 100 mEq/L (ref 96–112)
Creatinine, Ser: 1 mg/dL (ref 0.50–1.35)
GFR calc Af Amer: >90 mL/min (ref >90)
GFR calc non Af Amer: 82 mL/min — ABNORMAL LOW (ref >90)
Glucose, Bld: 101 mg/dL — ABNORMAL HIGH (ref 70–99)
Potassium: 3.7 mEq/L (ref 3.7–5.3)
Sodium: 141 mEq/L (ref 137–147)

## 2013-04-22 LAB — CBC
HCT: 30.8 % — ABNORMAL LOW (ref 39.0–52.0)
Hemoglobin: 10.6 g/dL — ABNORMAL LOW (ref 13.0–17.0)
MCH: 32.8 pg (ref 26.0–34.0)
MCHC: 34.4 g/dL (ref 30.0–36.0)
MCV: 95.4 fL (ref 78.0–100.0)
Platelets: 203 10*3/uL (ref 150–400)
RBC: 3.23 MIL/uL — ABNORMAL LOW (ref 4.22–5.81)
RDW: 13.4 % (ref 11.5–15.5)
WBC: 8.2 10*3/uL (ref 4.0–10.5)

## 2013-04-22 LAB — GLUCOSE, CAPILLARY: Glucose-Capillary: 110 mg/dL — ABNORMAL HIGH (ref 70–99)

## 2013-04-22 MED ORDER — AMIODARONE HCL 200 MG PO TABS
400.0000 mg | ORAL_TABLET | Freq: Three times a day (TID) | ORAL | Status: DC
  Administered 2013-04-22 – 2013-04-24 (×7): 400 mg via ORAL
  Filled 2013-04-22 (×9): qty 2

## 2013-04-22 MED ORDER — MOVING RIGHT ALONG BOOK
Freq: Once | Status: AC
  Administered 2013-04-22: 08:00:00
  Filled 2013-04-22: qty 1

## 2013-04-22 MED ORDER — AMIODARONE HCL 200 MG PO TABS
400.0000 mg | ORAL_TABLET | Freq: Two times a day (BID) | ORAL | Status: DC
  Filled 2013-04-22 (×2): qty 2

## 2013-04-22 MED ORDER — POTASSIUM CHLORIDE 10 MEQ/50ML IV SOLN
10.0000 meq | INTRAVENOUS | Status: AC
  Administered 2013-04-22 (×3): 10 meq via INTRAVENOUS
  Filled 2013-04-22: qty 50

## 2013-04-22 MED ORDER — ENOXAPARIN SODIUM 80 MG/0.8ML ~~LOC~~ SOLN
65.0000 mg | Freq: Every day | SUBCUTANEOUS | Status: DC
  Administered 2013-04-22 – 2013-04-23 (×2): 65 mg via SUBCUTANEOUS
  Filled 2013-04-22 (×3): qty 0.8

## 2013-04-22 MED ORDER — METOPROLOL TARTRATE 25 MG/10 ML ORAL SUSPENSION
12.5000 mg | Freq: Two times a day (BID) | ORAL | Status: DC
  Filled 2013-04-22 (×6): qty 5

## 2013-04-22 MED ORDER — METOPROLOL TARTRATE 25 MG PO TABS
25.0000 mg | ORAL_TABLET | Freq: Two times a day (BID) | ORAL | Status: DC
  Administered 2013-04-22 – 2013-04-24 (×4): 25 mg via ORAL
  Filled 2013-04-22 (×6): qty 1

## 2013-04-22 MED ORDER — FUROSEMIDE 40 MG PO TABS
40.0000 mg | ORAL_TABLET | Freq: Every day | ORAL | Status: DC
  Administered 2013-04-22 – 2013-04-24 (×3): 40 mg via ORAL
  Filled 2013-04-22 (×3): qty 1

## 2013-04-22 MED ORDER — POTASSIUM CHLORIDE CRYS ER 20 MEQ PO TBCR
20.0000 meq | EXTENDED_RELEASE_TABLET | Freq: Every day | ORAL | Status: DC
  Administered 2013-04-23 – 2013-04-24 (×2): 20 meq via ORAL
  Filled 2013-04-22 (×2): qty 1

## 2013-04-22 MED ORDER — SODIUM CHLORIDE 0.9 % IV SOLN: 250.0000 mL | INTRAVENOUS | Status: DC | PRN

## 2013-04-22 MED ORDER — SODIUM CHLORIDE 0.9 % IJ SOLN
3.0000 mL | Freq: Two times a day (BID) | INTRAMUSCULAR | Status: DC
  Administered 2013-04-22: 10:00:00 via INTRAVENOUS
  Administered 2013-04-23 (×2): 3 mL via INTRAVENOUS

## 2013-04-22 MED ORDER — SODIUM CHLORIDE 0.9 % IJ SOLN: 3.0000 mL | INTRAMUSCULAR | Status: DC | PRN

## 2013-04-22 NOTE — Addendum Note (Signed)
Addendum created 04/22/13 0631 by Roberts Gaudy, MD   Modules edited: Clinical Notes   Clinical Notes:  File: [id number]; [place]: [phone]

## 2013-04-22 NOTE — Progress Notes (Signed)
Held Metoprolol dose this evening d/t BP 97/54.  HR in 70s, maintaining NSR currently.  Will continue to monitor.  Gary [name] Patient

## 2013-04-22 NOTE — Progress Notes (Signed)
CARDIAC REHAB PHASE I   PRE:  Rate/Rhythm: 82 afib    BP: sitting 112/64    SaO2: 97 2L, 93 RA for about 8 min  MODE:  Ambulation: 350 ft   POST:  Rate/Rhythm: 80 SR    BP: sitting 120/72     SaO2: 99 RA  Pt moving well, just slow, used RW. Pt is very cautious. Able to wean off O2, left pt in recliner on RA after walk.  Will f/u tomorrow. Pt sts he has walked x3 today. [place], ACSM 04/22/2013 3:16 PM

## 2013-04-22 NOTE — Progress Notes (Signed)
Anesthesiology Follow-up:  Awake and alert, sitting in chair, neuro intact, walking with assistance. As noted he developed atrial fibrillation post-op but converted to SR yesterday morning and has maintained SR for last 24 hours. On IV amiodarone.  VS: T-36.9 BP=101/66 HR 74 RR 12 O2 Sat 96% on 3L   K-3.7 glucose 128 BUN/Cr. 21/1.0 H/H: 10.6/30.8 platelets 203,000   Extubated 3 1/2 hours post-op. Doing well, should be able to transfer to 2W today.  Roberts Gaudy, MD

## 2013-04-22 NOTE — Progress Notes (Addendum)
      [address]       [place] [address]             [phone]      4 Days Post-Op Procedure(s) (LRB): CORONARY ARTERY BYPASS GRAFTING (CABG) TIMES SIX USING LEFT INTERNAL MAMMARY ARTERY AND RIGHT SAPHENOUS LEG VEIN HARVESTED ENDOSCOPICALLY (N/A) INTRAOPERATIVE TRANSESOPHAGEAL ECHOCARDIOGRAM (N/A)  Subjective:  Gary [name] has no complaints this morning.  He states that he feels great and is ready to go home.  He is ambulating with minimal difficulty.  No BM  Objective: Vital signs in last 24 hours: Temp:  [97.8 F (36.6 C)-98.5 F (36.9 C)] 97.8 F (36.6 C) (04/14 0745) Pulse Rate:  [65-90] 71 (04/14 0700) Cardiac Rhythm:  [-] Normal sinus rhythm (04/14 0400) Resp:  [11-24] 18 (04/14 0700) BP: (98-119)/(57-77) 105/73 mmHg (04/14 0700) SpO2:  [90 %-97 %] 92 % (04/14 0700) Weight:  [288 lb 9.3 oz (130.9 kg)] 288 lb 9.3 oz (130.9 kg) (04/14 0500)  Intake/Output from previous day: 04/13 0701 - 04/14 0700 In: 1270.7 [P.O.:240; I.V.:930.7; IV Piggyback:100] Out: 1825 [Urine:1825]  General appearance: alert, cooperative and no distress Heart: regular rate and rhythm Lungs: clear to auscultation bilaterally Abdomen: soft, non-tender; bowel sounds normal; no masses,  no organomegaly Extremities: edema trace Wound: clean and dry  Lab Results:  Recent Labs  04/21/13 0444 04/22/13 0455  WBC 12.3* 8.2  HGB 10.8* 10.6*  HCT 32.2* 30.8*  PLT 184 203   BMET:  Recent Labs  04/21/13 0444 04/22/13 0455  NA 139 141  K 3.7 3.7  CL 101 100  CO2 28 29  GLUCOSE 107* 101*  BUN 17 21  CREATININE 1.00 1.00  CALCIUM 8.7 8.7    PT/INR: No results found for this basename: LABPROT, INR,  in the last 72 hours ABG    Component Value Date/Time   PHART 7.336* 04/18/2013 1809   HCO3 23.1 04/18/2013 1809   TCO2 27 04/19/2013 1602   ACIDBASEDEF 3.0* 04/18/2013 1809   O2SAT 94.0 04/18/2013 1809   CBG (last 3)   Recent Labs  04/21/13 1155 04/21/13 1740  04/21/13 2207  GLUCAP 112* 102* 108*    Assessment/Plan: S/P Procedure(s) (LRB): CORONARY ARTERY BYPASS GRAFTING (CABG) TIMES SIX USING LEFT INTERNAL MAMMARY ARTERY AND RIGHT SAPHENOUS LEG VEIN HARVESTED ENDOSCOPICALLY (N/A) INTRAOPERATIVE TRANSESOPHAGEAL ECHOCARDIOGRAM (N/A)  1. CV- Previous A. Fib, maintaining NSR last 24 hrs- will d/c IV Amiodarone, will start PO 400 mg BID, only on Lopressor at 12.5 mg BID, will watch HR currently in the low 70s and increase if he can tolerate 2. Pulm- wean oxygen as tolerated, continue IS 3. Renal- creatinine WNL, remains volume overloaded but weight is improving will start oral Lasix at 40 mg daily 4. DM- sugars are controlled, patient is not a diabetic will d/c finger sticks 5. Dispo- patient looks good, maintaining NSR will transition IV Amiodarone to PO, transfer to 2000    LOS: 6 days    Erin Barrett 04/22/2013  Brief episodes of  PAC/Afib  On Lovenox 65 mg /daily for dvt, if continues with Afib will add coumadin To stepdown I have seen and examined Gary [name] and agree with the above assessment  and plan.  [name] MD [facility] [phone] Office [phone] 04/22/2013 9:13 AM

## 2013-04-22 NOTE — Progress Notes (Signed)
Subjective:  Still having some runs of a fib intermittently.  Less SOB,  Walking now.   Feels much better.  Objective:  Vital Signs in the last 24 hours: BP 110/72  Pulse 73  Temp(Src) 97.8 F (36.6 C) (Oral)  Resp 25  Ht 6\' 2"  (1.88 m)  Wt 130.9 kg (288 lb 9.3 oz)  BMI 37.04 kg/m2  SpO2 93%  Physical Exam: Pleasant WM in NAD Lungs:  Reduced breath sounds Cardiac:  Regular rhythm, normal S1 and S2, no S3 Abdomen:  Soft, nontender, no masses Extremities:  1+ edema present  Intake/Output from previous day: 04/13 0701 - 04/14 0700 In: 1270.7 [P.O.:240; I.V.:930.7; IV Piggyback:100] Out: 1825 [Urine:1825] Weight Filed Weights   04/20/13 0500 04/21/13 0500 04/22/13 0500  Weight: 132.4 kg (291 lb 14.2 oz) 131 kg (288 lb 12.8 oz) 130.9 kg (288 lb 9.3 oz)   Lab Results: Basic Metabolic Panel:  Recent Labs  04/21/13 0444 04/22/13 0455  NA 139 141  K 3.7 3.7  CL 101 100  CO2 28 29  GLUCOSE 107* 101*  BUN 17 21  CREATININE 1.00 1.00   CBC:  Recent Labs  04/21/13 0444 04/22/13 0455  WBC 12.3* 8.2  HGB 10.8* 10.6*  HCT 32.2* 30.8*  MCV 95.5 95.4  PLT 184 203   Telemetry: NSR with PAC's and some nonsustained runs of a fib.  Assessment/Plan:  1. Post op a fib now in NSR.  Changing to oral amiodarone and beta blocker.  No value in cardioversion at this point as in NSR.  He may swing back and forth with a fib due to inflammation.  2. CAD 3. Mild elevation of blood sugar   OK to go to floor.  I would increase his beta blocker.  Kerry Hough  MD [facility] Cardiology  04/22/2013, 8:55 AM

## 2013-04-23 LAB — CBC
HCT: 31.9 % — ABNORMAL LOW (ref 39.0–52.0)
HEMOGLOBIN: 10.7 g/dL — AB (ref 13.0–17.0)
MCH: 32.3 pg (ref 26.0–34.0)
MCHC: 33.5 g/dL (ref 30.0–36.0)
MCV: 96.4 fL (ref 78.0–100.0)
Platelets: 251 10*3/uL (ref 150–400)
RBC: 3.31 MIL/uL — AB (ref 4.22–5.81)
RDW: 13.4 % (ref 11.5–15.5)
WBC: 8.8 10*3/uL (ref 4.0–10.5)

## 2013-04-23 LAB — BASIC METABOLIC PANEL
BUN: 24 mg/dL — AB (ref 6–23)
CO2: 29 meq/L (ref 19–32)
CREATININE: 1.29 mg/dL (ref 0.50–1.35)
Calcium: 8.8 mg/dL (ref 8.4–10.5)
Chloride: 99 mEq/L (ref 96–112)
GFR, EST AFRICAN AMERICAN: 70 mL/min — AB (ref >90)
GFR, EST NON AFRICAN AMERICAN: 60 mL/min — AB (ref >90)
GLUCOSE: 102 mg/dL — AB (ref 70–99)
Potassium: 4 mEq/L (ref 3.7–5.3)
Sodium: 141 mEq/L (ref 137–147)

## 2013-04-23 MED ORDER — HYDROCORTISONE 1 % EX CREA
TOPICAL_CREAM | Freq: Three times a day (TID) | CUTANEOUS | Status: DC | PRN
  Administered 2013-04-23 – 2013-04-24 (×2): via TOPICAL
  Filled 2013-04-23: qty 28

## 2013-04-23 MED ORDER — ATORVASTATIN CALCIUM 10 MG PO TABS
10.0000 mg | ORAL_TABLET | Freq: Every day | ORAL | Status: DC
  Administered 2013-04-23: 10 mg via ORAL
  Filled 2013-04-23 (×2): qty 1

## 2013-04-23 NOTE — Progress Notes (Addendum)
CARDIAC REHAB PHASE I   PRE:  Rate/Rhythm: 74 SR  BP:  Supine:   Sitting: 100/70  Standing:    SaO2: 96 RA  MODE:  Ambulation: 690 ft   POST:  Rate/Rhythm: 86 SR  BP:  Supine:   Sitting: 128/80  Standing:    SaO2: 96 RA Pt tolerated ambulation well without using walker. Slow steady pace. Pt able to walk 69-0 feet without c/o. VS stable. Pt remained SR before and after walk. Pt to bed after walk with call light in reach. We discussed Outpt. CRP, he agrees to referral to [facility] program. Encouraged pt to watch recovery from heart surgery video, states that he will watch when his wife gets here.  Rodney Langton RN 04/23/2013 11:58 AM [phone]

## 2013-04-23 NOTE — Progress Notes (Signed)
Patient ambulated approximately 600 ft this morning with RW and RN and tolerated well.  Steady on his feet with no stops for rest.  Pt's HR did go up into the 140s on two occasions briefly, but patient's HR returned to the 90s when at rest.  Patient left in chair with call bell in hand and encouraged to call if he needs [place].  Will continue to monitor and will pass on to day shift.  Gary [name] Patient

## 2013-04-23 NOTE — Progress Notes (Signed)
Subjective:  Some a fib earlier this am and occasional nonsustained runs.  Breathing good.  No SOB.  Objective:  Vital Signs in the last 24 hours: BP 138/76  Pulse 76  Temp(Src) 97.6 F (36.4 C) (Oral)  Resp 18  Ht 6\' 2"  (1.88 m)  Wt 130.001 kg (286 lb 9.6 oz)  BMI 36.78 kg/m2  SpO2 97%  Physical Exam: Pleasant WM in NAD Lungs:  Reduced breath sounds Cardiac:  Regular rhythm, normal S1 and S2, no S3 Abdomen:  Soft, nontender, no masses Extremities:  1+ edema present  Intake/Output from previous day: 04/14 0701 - 04/15 0700 In: 136.7 [I.V.:36.7; IV Piggyback:100] Out: 450 [Urine:450] Weight Filed Weights   04/21/13 0500 04/22/13 0500 04/23/13 0641  Weight: 131 kg (288 lb 12.8 oz) 130.9 kg (288 lb 9.3 oz) 130.001 kg (286 lb 9.6 oz)   Lab Results: Basic Metabolic Panel:  Recent Labs  04/22/13 0455 04/23/13 0320  NA 141 141  K 3.7 4.0  CL 100 99  CO2 29 29  GLUCOSE 101* 102*  BUN 21 24*  CREATININE 1.00 1.29   CBC:  Recent Labs  04/22/13 0455 04/23/13 0320  WBC 8.2 8.8  HGB 10.6* 10.7*  HCT 30.8* 31.9*  MCV 95.4 96.4  PLT 203 251   Telemetry: NSR with PAC's and some nonsustained runs of a fib.  Assessment/Plan:  1. Post op a fib now in NSR.  Still some intermittent nonsustained a fib 2. CAD 3. Mild elevation of blood sugar   Hopefully home in am.  W. Doristine Church  MD [facility] Cardiology  04/23/2013, 9:32 AM

## 2013-04-23 NOTE — Discharge Summary (Signed)
[address]       [place] [address]             [phone]              Discharge Summary  Name: Gary [name] DOB: 02/23/1955 58 y.o. MRN: [id number]   Admission Date: 04/16/2013 Discharge Date:     Admitting Diagnosis: Chest pain   Discharge Diagnosis:  Severe three vessel coronary artery disease Unstable angina Expected postoperative blood loss anemia Postoperative atrial fibrillation  Past Medical History  Diagnosis Date  . Hypertension   . Kidney stones   . Obesity (BMI 30-39.9) 04/15/2013  . CAD (coronary artery disease), native coronary artery     Coronary calcifications noted on CT scan in 2013       Procedures: CORONARY ARTERY BYPASS GRAFTING x 6 (Left internal mammary artery to distal left anterior descending, saphenous vein graft to diagonal, sequential saphenous vein graft to first and second diagonals, sequential saphenous vein graft to acute marginal and distal circumflex) ENDOSCOPIC VEIN HARVEST RIGHT LEG - 04/18/2013   HPI:  The patient is a 58 y.o. male who was recently seen by his internist for flu symptoms. While being seen, he related three episodes of exertional chest pain that occurred in January. 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 He would have to stop to obtain relief of symptoms. He had a similar episode after he returned to town and later then developed "the flu", which he described as upper respiratory congestion, malaise and fatigue. His primary physician recommended an exercise treadmill study. He was referred to cardiology for further evaluation. He walked less than 6 minutes on treadmill and the test was stopped due to severe dyspnea. He had a blunted heart rate response to exercise and a blunted blood pressure response to exercise and had 1-2 mm of ST depression in inferolateral  leads that resolved in recovery. Due to stress test findings,cardiac catheterization was recommended, and this was performed on the date of admission by Dr. Wynonia Lawman.  The patient was found to have severe 3 vessel coronary artery disease, including a 99% proximal circumflex stenosis.  He was admitted following catheterization for cardiac surgery evaluation.   Hospital Course:  The patient was admitted to [facility] on 04/16/2013.  Dr. Servando Snare saw the patient and reviewed his films.  It was felt that he would benefit from surgical revascularization.  All risks, benefits and alternatives of surgery were explained in detail, and the patient agreed to proceed. The patient was taken to the operating room and underwent the above procedure.    The postoperative course was notable for atrial fibrillation which occurred on postop day 2.  He was started on IV Amiodarone and ultimately converted to normal sinus rhythm. This was switched to and oral dose, and he was also started on a beta blocker.  He remained stable and was transferred to stepdown on postop day 4.  Overall, he has progressed well. He is ambulating in the halls without difficulty and is tolerating a regular diet.  Incisions are healing well. He has had some brief, nonsustained episodes of atrial fib which have been followed by cardiology, but generally is remaining in sinus rhythm.  He has otherwise remained stable.  The patient was evaluated on today's date and is ready for discharge home.     Recent vital  signs:  Filed Vitals:   04/23/13 0944  BP: 135/64  Pulse: 80  Temp:   Resp:     Recent laboratory studies:  CBC: Recent Labs  04/22/13 0455 04/23/13 0320  WBC 8.2 8.8  HGB 10.6* 10.7*  HCT 30.8* 31.9*  PLT 203 251   BMET:  Recent Labs  04/22/13 0455 04/23/13 0320  NA 141 141  K 3.7 4.0  CL 100 99  CO2 29 29  GLUCOSE 101* 102*  BUN 21 24*  CREATININE 1.00 1.29  CALCIUM 8.7 8.8    PT/INR: No results found for this  basename: LABPROT, INR,  in the last 72 hours   Discharge Medications:     Medication List    STOP taking these medications       irbesartan 150 MG tablet  Commonly known as:  AVAPRO      TAKE these medications       allopurinol 100 MG tablet  Commonly known as:  ZYLOPRIM  Take 400 mg by mouth daily. Takes along with a 300mg  tablet to equal 400mg      allopurinol 300 MG tablet  Commonly known as:  ZYLOPRIM  Take 400 mg by mouth daily. Takes along with a 100mg  tablet to equal 400mg      amiodarone 400 MG tablet  Commonly known as:  PACERONE  Take 1 tablet (400 mg total) by mouth 2 (two) times daily. For 7 days, then take 400mg  once daily     aspirin 325 MG EC tablet  Take 1 tablet (325 mg total) by mouth daily.     atorvastatin 10 MG tablet  Commonly known as:  LIPITOR  Take 1 tablet (10 mg total) by mouth daily at 6 PM.     CENTRUM SILVER PO  Take 1 tablet by mouth daily.     furosemide 40 MG tablet  Commonly known as:  LASIX  Take 1 tablet (40 mg total) by mouth daily.     metoprolol tartrate 25 MG tablet  Commonly known as:  LOPRESSOR  Take 1 tablet (25 mg total) by mouth 2 (two) times daily.     oxyCODONE 5 MG immediate release tablet  Commonly known as:  Oxy IR/ROXICODONE  Take 1-2 tablets (5-10 mg total) by mouth every 4 (four) hours as needed for moderate pain.     potassium chloride SA 20 MEQ tablet  Commonly known as:  K-DUR,KLOR-CON  Take 1 tablet (20 mEq total) by mouth daily.     REFRESH OP  Place 1 drop into both eyes 2 (two) times daily as needed (dry eyes).     tamsulosin 0.4 MG Caps capsule  Commonly known as:  FLOMAX  Take 0.4 mg by mouth daily.     vitamin C 1000 MG tablet  Take 2,000 mg by mouth daily.         Discharge Instructions:  The patient is to refrain from driving, heavy lifting or strenuous activity.  May shower daily and clean incisions with soap and water.  May resume regular diet.   Follow Up:   Follow-up Information    Follow up with TILLEY JR,W SPENCER, MD. Schedule an appointment as soon as possible for a visit in 2 weeks.   Specialty:  Cardiology   Contact information:   [address] [place] [address] [phone]       Follow up with [name], MD On 05/15/2013. (Have a chest x-ray at [place] at 11:45  , then  see MD at 12:45)    Specialty:  Cardiothoracic Surgery   Contact information:   [address] [place] [address] [phone]       The patient has been discharged on:  1.Beta Blocker: Yes [ x ]  No [ ]   If No, reason:    2.Ace Inhibitor/ARB: Yes [ ]   No [ x ]  If No, reason: Low systolic BPs   3.Statin: Yes [ x ]  No [ ]   If No, reason:    4.Ecasa: Yes [ x ]  No [ ]   If No, reason:     Coolidge Breeze 04/23/2013, 9:48 AM

## 2013-04-23 NOTE — Progress Notes (Signed)
Patient ID: Gary [name], male   DOB: [birth year], 58 y.o.   MRN: [id number]      [address]       [place] [address]             [phone]                 5 Days Post-Op Procedure(s) (LRB): CORONARY ARTERY BYPASS GRAFTING (CABG) TIMES SIX USING LEFT INTERNAL MAMMARY ARTERY AND RIGHT SAPHENOUS LEG VEIN HARVESTED ENDOSCOPICALLY (N/A) INTRAOPERATIVE TRANSESOPHAGEAL ECHOCARDIOGRAM (N/A)  LOS: 7 days   Subjective: Feels better today, sinus today, no bm yet  Objective: Vital signs in last 24 hours: Patient Vitals for the past 24 hrs:  BP Temp Temp src Pulse Resp SpO2 Weight  04/23/13 0641 - - - - - - 286 lb 9.6 oz (130.001 kg)  04/23/13 0519 138/76 mmHg 97.6 F (36.4 C) Oral 76 18 97 % -  04/22/13 2230 97/54 mmHg - - 74 - - -  04/22/13 2001 99/54 mmHg 99.2 F (37.3 C) Oral 88 18 98 % -  04/22/13 1651 108/73 mmHg - - 78 - - -  04/22/13 1518 100/64 mmHg 98 F (36.7 C) - 72 16 94 % -  04/22/13 1508 110/68 mmHg - - - - - -  04/22/13 1507 92/64 mmHg 98 F (36.7 C) Oral 73 18 - -  04/22/13 1133 109/68 mmHg 97 F (36.1 C) Oral 101 24 96 % -  04/22/13 1100 109/72 mmHg - - 97 21 93 % -  04/22/13 1000 116/71 mmHg - - 78 45 94 % -  04/22/13 0900 106/62 mmHg - - 56 30 94 % -    Filed Weights   04/21/13 0500 04/22/13 0500 04/23/13 0641  Weight: 288 lb 12.8 oz (131 kg) 288 lb 9.3 oz (130.9 kg) 286 lb 9.6 oz (130.001 kg)    Hemodynamic parameters for last 24 hours:    Intake/Output from previous day: 04/14 0701 - 04/15 0700 In: 136.7 [I.V.:36.7; IV Piggyback:100] Out: 450 [Urine:450] Intake/Output this shift:    Scheduled Meds: . allopurinol  400 mg Oral Daily  . amiodarone  400 mg Oral TID  . aspirin EC  325 mg Oral Daily   Or  . aspirin  324 mg Per Tube Daily  . bisacodyl  10 mg Oral Daily   Or  . bisacodyl  10 mg Rectal Daily  . docusate sodium  200 mg Oral Daily  . enoxaparin (LOVENOX) injection  65 mg Subcutaneous QHS  . furosemide  40 mg Oral  Daily  . metoprolol tartrate  25 mg Oral BID   Or  . metoprolol tartrate  12.5 mg Per Tube BID  . pantoprazole  40 mg Oral Daily  . potassium chloride  20 mEq Oral Daily  . sodium chloride  3 mL Intravenous Q12H  . sodium chloride  3 mL Intravenous Q12H  . tamsulosin  0.4 mg Oral Daily  . vitamin C  2,000 mg Oral Daily   Continuous Infusions: . sodium chloride Stopped (04/19/13 0900)  . sodium chloride 20 mL/hr at 04/18/13 1800  . sodium chloride     PRN Meds:.sodium chloride, metoprolol, morphine injection, ondansetron (ZOFRAN) IV, oxyCODONE, sodium chloride, sodium chloride  General appearance: alert and cooperative Neurologic: intact Heart: regular rate and rhythm, S1, S2 normal, no murmur, click, rub or gallop Lungs: clear to auscultation bilaterally Abdomen: soft, non-tender; bowel sounds normal; no masses,  no organomegaly Extremities: extremities normal, atraumatic, no cyanosis  or edema and Homans sign is negative, no sign of DVT Wound: sternum stable  Lab Results: CBC: Recent Labs  04/22/13 0455 04/23/13 0320  WBC 8.2 8.8  HGB 10.6* 10.7*  HCT 30.8* 31.9*  PLT 203 251   BMET:  Recent Labs  04/22/13 0455 04/23/13 0320  NA 141 141  K 3.7 4.0  CL 100 99  CO2 29 29  GLUCOSE 101* 102*  BUN 21 24*  CREATININE 1.00 1.29  CALCIUM 8.7 8.8    PT/INR: No results found for this basename: LABPROT, INR,  in the last 72 hours   Radiology No results found.   Assessment/Plan: S/P Procedure(s) (LRB): CORONARY ARTERY BYPASS GRAFTING (CABG) TIMES SIX USING LEFT INTERNAL MAMMARY ARTERY AND RIGHT SAPHENOUS LEG VEIN HARVESTED ENDOSCOPICALLY (N/A) INTRAOPERATIVE TRANSESOPHAGEAL ECHOCARDIOGRAM (N/A) feels better today, poss home in am   [name] MD 04/23/2013 8:15 AM

## 2013-04-24 ENCOUNTER — Inpatient Hospital Stay (HOSPITAL_COMMUNITY): Payer: BC Managed Care – PPO

## 2013-04-24 DIAGNOSIS — I4891 Unspecified atrial fibrillation: Secondary | ICD-10-CM | POA: Diagnosis not present

## 2013-04-24 DIAGNOSIS — I480 Paroxysmal atrial fibrillation: Secondary | ICD-10-CM | POA: Diagnosis not present

## 2013-04-24 LAB — CBC
HCT: 33.5 % — ABNORMAL LOW (ref 39.0–52.0)
Hemoglobin: 11.1 g/dL — ABNORMAL LOW (ref 13.0–17.0)
MCH: 31.9 pg (ref 26.0–34.0)
MCHC: 33.1 g/dL (ref 30.0–36.0)
MCV: 96.3 fL (ref 78.0–100.0)
Platelets: 310 10*3/uL (ref 150–400)
RBC: 3.48 MIL/uL — ABNORMAL LOW (ref 4.22–5.81)
RDW: 13.7 % (ref 11.5–15.5)
WBC: 9.2 10*3/uL (ref 4.0–10.5)

## 2013-04-24 LAB — BASIC METABOLIC PANEL
BUN: 25 mg/dL — ABNORMAL HIGH (ref 6–23)
CO2: 29 mEq/L (ref 19–32)
Calcium: 8.9 mg/dL (ref 8.4–10.5)
Chloride: 96 mEq/L (ref 96–112)
Creatinine, Ser: 1.4 mg/dL — ABNORMAL HIGH (ref 0.50–1.35)
GFR calc Af Amer: 63 mL/min — ABNORMAL LOW (ref >90)
GFR calc non Af Amer: 55 mL/min — ABNORMAL LOW (ref >90)
Glucose, Bld: 111 mg/dL — ABNORMAL HIGH (ref 70–99)
Potassium: 3.8 mEq/L (ref 3.7–5.3)
Sodium: 140 mEq/L (ref 137–147)

## 2013-04-24 MED ORDER — ASPIRIN 325 MG PO TBEC: 325.0000 mg | DELAYED_RELEASE_TABLET | Freq: Every day | ORAL | Status: DC

## 2013-04-24 MED ORDER — ATORVASTATIN CALCIUM 10 MG PO TABS: 10.0000 mg | ORAL_TABLET | Freq: Every day | ORAL | Status: DC

## 2013-04-24 MED ORDER — AMIODARONE HCL 400 MG PO TABS: 400.0000 mg | ORAL_TABLET | Freq: Two times a day (BID) | ORAL | Status: DC

## 2013-04-24 MED ORDER — METOPROLOL TARTRATE 25 MG PO TABS: 25.0000 mg | ORAL_TABLET | Freq: Two times a day (BID) | ORAL | Status: DC

## 2013-04-24 MED ORDER — OXYCODONE HCL 5 MG PO TABS: 5.0000 mg | ORAL_TABLET | ORAL | Status: DC | PRN

## 2013-04-24 MED ORDER — POTASSIUM CHLORIDE CRYS ER 20 MEQ PO TBCR: 20.0000 meq | EXTENDED_RELEASE_TABLET | Freq: Every day | ORAL | Status: DC

## 2013-04-24 MED ORDER — FUROSEMIDE 40 MG PO TABS: 40.0000 mg | ORAL_TABLET | Freq: Every day | ORAL | Status: DC

## 2013-04-24 NOTE — Progress Notes (Addendum)
[address]       [place], [address]             [phone]      6 Days Post-Op  Procedure(s) (LRB): CORONARY ARTERY BYPASS GRAFTING (CABG) TIMES SIX USING LEFT INTERNAL MAMMARY ARTERY AND RIGHT SAPHENOUS LEG VEIN HARVESTED ENDOSCOPICALLY (N/A) INTRAOPERATIVE TRANSESOPHAGEAL ECHOCARDIOGRAM (N/A) Subjective: Feels well  Objective  Telemetry sinus, pac's  Temp:  [97.7 F (36.5 C)-98.4 F (36.9 C)] 98 F (36.7 C) (04/16 0454) Pulse Rate:  [72-87] 87 (04/16 0454) Resp:  [17-18] 18 (04/16 0454) BP: (106-135)/(61-69) 122/67 mmHg (04/16 0454) SpO2:  [94 %] 94 % (04/16 0454) Weight:  [283 lb 8 oz (128.595 kg)] 283 lb 8 oz (128.595 kg) (04/16 0538)   Intake/Output Summary (Last 24 hours) at 04/24/13 0754 Last data filed at 04/23/13 1827  Gross per 24 hour  Intake    720 ml  Output   1075 ml  Net   -355 ml       General appearance: alert, cooperative and no distress Heart: regular rate and rhythm Lungs: mildly dim in am Abdomen: benign Extremities: minor edema Wound: incis healing well  Lab Results:  Recent Labs  04/23/13 0320 04/24/13 0225  NA 141 140  K 4.0 3.8  CL 99 96  CO2 29 29  GLUCOSE 102* 111*  BUN 24* 25*  CREATININE 1.29 1.40*  CALCIUM 8.8 8.9   No results found for this basename: AST, ALT, ALKPHOS, BILITOT, PROT, ALBUMIN,  in the last 72 hours No results found for this basename: LIPASE, AMYLASE,  in the last 72 hours  Recent Labs  04/23/13 0320 04/24/13 0225  WBC 8.8 9.2  HGB 10.7* 11.1*  HCT 31.9* 33.5*  MCV 96.4 96.3  PLT 251 310   No results found for this basename: CKTOTAL, CKMB, TROPONINI,  in the last 72 hours No components found with this basename: POCBNP,  No results found for this basename: DDIMER,  in the last 72 hours No results found for this basename: HGBA1C,  in the last 72 hours No results found for this basename: CHOL, HDL, LDLCALC, TRIG, CHOLHDL,  in the last 72 hours No results found for this  basename: TSH, T4TOTAL, FREET3, T3FREE, THYROIDAB,  in the last 72 hours No results found for this basename: VITAMINB12, FOLATE, FERRITIN, TIBC, IRON, RETICCTPCT,  in the last 72 hours  Medications: Scheduled . allopurinol  400 mg Oral Daily  . amiodarone  400 mg Oral TID  . aspirin EC  325 mg Oral Daily   Or  . aspirin  324 mg Per Tube Daily  . atorvastatin  10 mg Oral q1800  . bisacodyl  10 mg Oral Daily   Or  . bisacodyl  10 mg Rectal Daily  . docusate sodium  200 mg Oral Daily  . enoxaparin (LOVENOX) injection  65 mg Subcutaneous QHS  . furosemide  40 mg Oral Daily  . metoprolol tartrate  25 mg Oral BID   Or  . metoprolol tartrate  12.5 mg Per Tube BID  . pantoprazole  40 mg Oral Daily  . potassium chloride  20 mEq Oral Daily  . sodium chloride  3 mL Intravenous Q12H  . sodium chloride  3 mL Intravenous Q12H  . tamsulosin  0.4 mg Oral Daily  . vitamin C  2,000 mg Oral Daily     Radiology/Studies:  No results found.  INR: Will add last result for INR, ABG once  components are confirmed Will add last 4 CBG results once components are confirmed  Assessment/Plan: S/P Procedure(s) (LRB): CORONARY ARTERY BYPASS GRAFTING (CABG) TIMES SIX USING LEFT INTERNAL MAMMARY ARTERY AND RIGHT SAPHENOUS LEG VEIN HARVESTED ENDOSCOPICALLY (N/A) INTRAOPERATIVE TRANSESOPHAGEAL ECHOCARDIOGRAM (N/A)  1 stable overall 2 no further afib, some pac's 3 cont gentle diuresis 4 stable for discharge     LOS: 8 days    Gary [name] 4/16/20157:54 AM  Plan d/c today Holding sinus I have seen and examined Gary [name] and agree with the above assessment  and plan.  [name] MD [facility] [phone] Office [phone] 04/24/2013 8:42 AM

## 2013-04-24 NOTE — Progress Notes (Signed)
Pulled bil pacing wires. Pt tolerated well. Vs obtain, pt on bedrest for 1hr. Monitoring will continue.

## 2013-04-24 NOTE — Progress Notes (Signed)
Patient ambulated almost 400 ft without using walker or O2 with steady pace,tolerated well O2 saturation post ambulation is 93% on room air,HR at 86 NSR.

## 2013-04-24 NOTE — Discharge Instructions (Signed)
Atrial Fibrillation Atrial fibrillation is a condition that causes your heart to beat irregularly. It may also cause your heart to beat faster than normal. Atrial fibrillation can prevent your heart from pumping blood normally. It increases your risk of stroke and heart problems. HOME CARE  Take medications as told by your doctor.  Only take medications that your doctor says are safe. Some medications can make the condition worse or happen again.  If blood thinners were prescribed by your doctor, take them exactly as told. Too much can cause bleeding. Too little and you will not have the needed protection against stroke and other problems.  Perform blood tests at home if told by your doctor.  Perform blood tests exactly as told by your doctor.  Do not drink alcohol.  Do not drink beverages with caffeine such as coffee, soda, and some teas.  Maintain a healthy weight.  Do not use diet pills unless your doctor says they are safe. They may make heart problems worse.  Follow diet instructions as told by your doctor.  Exercise regularly as told by your doctor.  Keep all follow-up appointments. GET HELP RIGHT AWAY IF:   You have chest or belly (abdominal) pain.  You feel sick to your stomach (nauseous)  You suddenly have swollen feet and ankles.  You feel dizzy.  You face, arms, or legs feel numb or weak.  There is a change in your vision or speech.  You notice a change in the speed, rhythm, or strength of your heartbeat.  You suddenly begin peeing (urinating) more often.  You get tired more easily when moving or exercising. MAKE SURE YOU:   Understand these instructions.  Will watch your condition.  Will get help right away if you are not doing well or get worse. Document Released: 10/05/2007 Document Revised: 04/22/2012 Document Reviewed: 02/06/2012 [facility] Patient Information 2014 [place]. Endoscopic Saphenous Vein Harvesting Care After Refer to this  sheet in the next few weeks. These instructions provide you with information on caring for yourself after your procedure. Your caregiver may also give you more specific instructions. Your treatment has been planned according to current medical practices, but problems sometimes occur. Call your caregiver if you have any problems or questions after your procedure. HOME CARE INSTRUCTIONS Medicine  Take whatever pain medicine your surgeon prescribes. Follow the directions carefully. Do not take over-the-counter pain medicine unless your surgeon says it is okay. Some pain medicine can cause bleeding problems for several weeks after surgery.  Follow your surgeon's instructions about driving. You will probably not be permitted to drive after heart surgery.  Take any medicines your surgeon prescribes. Any medicines you took before your heart surgery should be checked with your caregiver before you start taking them again. Wound care  Ask your surgeon how long you should keep wearing your elastic bandage or stocking.  Check the area around your surgical cuts (incisions) whenever your bandages (dressings) are changed. Look for any redness or swelling.  You will need to return to have the stitches (sutures) or staples taken out. Ask your surgeon when to do that.  Ask your surgeon when you can shower or bathe. Activity  Try to keep your legs raised when you are sitting.  Do any exercises your caregivers have given you. These may include deep breathing exercises, coughing, walking, or other exercises. SEEK MEDICAL CARE IF:  You have any questions about your medicines.  You have more leg pain, especially if your pain medicine stops  working.  New or growing bruises develop on your leg.  Your leg swells, feels tight, or becomes red.  You have numbness in your leg. SEEK IMMEDIATE MEDICAL CARE IF:  Your pain gets much worse.  Blood or fluid leaks from any of the incisions.  Your incisions  become warm, swollen, or red.  You have chest pain.  You have trouble breathing.  You have a fever.  You have more pain near your leg incision. MAKE SURE YOU:  Understand these instructions.  Will watch your condition.  Will get help right away if you are not doing well or get worse. Document Released: 09/07/2010 Document Revised: 03/20/2011 Document Reviewed: 09/07/2010 [facility] Patient Information 2014 [place], [place]. Coronary Artery Bypass Grafting, Care After Refer to this sheet in the next few weeks. These instructions provide you with information on caring for yourself after your procedure. Your health care provider may also give you more specific instructions. Your treatment has been planned according to current medical practices, but problems sometimes occur. Call your health care provider if you have any problems or questions after your procedure. WHAT TO EXPECT AFTER THE PROCEDURE Recovery from surgery will be different for everyone. Some people feel well after 3 or 4 weeks, while for others it takes longer. After your procedure, it is typical to have the following:  Nausea and a lack of appetite.   Constipation.  Weakness and fatigue.   Depression or irritability.   Pain or discomfort at your incision site. HOME CARE INSTRUCTIONS  Only take over-the-counter or prescription medicines as directed by your health care provider. Take all medicines exactly as directed. Do not stop taking medicines or start any new medicines without first checking with your health care provider.   Take your pulse as directed by your health care provider.  Perform deep breathing as directed by your health care provider. If you were given a device called an incentive spirometer, use it to practice deep breathing several times a day. Support your chest with a pillow or your arms when you take deep breaths or cough.  Keep incision areas clean, dry, and protected. Remove or change any  bandages (dressings) only as directed by your health care provider. You may have skin adhesive strips over the incision areas. Do not take the strips off. They will fall off on their own.  Check incision areas daily for any swelling, redness, or drainage.  If incisions were made in your legs, do the following:  Avoid crossing your legs.   Avoid sitting for long periods of time. Change positions every 30 minutes.   Elevate your legs when you are sitting.   Wear compression stockings as directed by your health care provider. These stockings help keep blood clots from forming in your legs.  Take showers once your health care provider approves. Until then, only take sponge baths. Pat incisions dry. Do not rub incisions with a washcloth or towel. Do not take tub baths or go swimming until your health care provider approves.  Eat foods that are high in fiber, such as raw fruits and vegetables, whole grains, beans, and nuts. Meats should be lean cut. Avoid canned, processed, and fried foods.  Drink enough fluids to keep your urine clear or pale yellow.  Weigh yourself every day. This helps identify if you are retaining fluid that may make your heart and lungs work harder.   Rest and limit activity as directed by your health care provider. You may be instructed to:  Stop any activity at once if you have chest pain, shortness of breath, irregular heartbeats, or dizziness. Get help right away if you have any of these symptoms.  Move around frequently for short periods or take short walks as directed by your health care provider. Increase your activities gradually. You may need physical therapy or cardiac rehabilitation to help strengthen your muscles and build your endurance.  Avoid lifting, pushing, or pulling anything heavier than 10 lb (4.5 kg) for at least 6 weeks after surgery.  Do not drive until your health care provider approves.  Ask your health care provider when you may return  to work and resume sexual activity.  Follow up with your health care provider as directed.  SEEK MEDICAL CARE IF:  You have swelling, redness, increasing pain, or drainage at the site of an incision.   You develop a fever.   You have swelling in your ankles or legs.   You have pain in your legs.   You have weight gain of 2 or more pounds a day.  You are nauseous or vomit.  You have diarrhea. SEEK IMMEDIATE MEDICAL CARE IF:  You have chest pain that goes to your jaw or arms.  You have shortness of breath.   You have a fast or irregular heartbeat.   You notice a "clicking" in your breastbone (sternum) when you move.   You have numbness or weakness in your arms or legs.  You feel dizzy or lightheaded.  MAKE SURE YOU:  Understand these instructions.  Will watch your condition.  Will get help right away if you are not doing well or get worse. Document Released: 07/15/2004 Document Revised: 08/28/2012 Document Reviewed: 06/04/2012 [facility] Patient Information 2014 [place].

## 2013-04-24 NOTE — Care Management Note (Signed)
    Page 1 of 1   04/24/2013     11:59:50 AM   CARE MANAGEMENT NOTE 04/24/2013  Patient:  Gary [name], Gary [name]   Account Number:  [id number]  Date Initiated:  04/18/2013  Documentation initiated by:  Gary [name]  Subjective/Objective Assessment:   Admitted with CP - abnormal stress test. cath showing 3 Vessal CAD.  4-10 post op CABG x6.     Action/Plan:   Anticipated DC Date:  04/23/2013   Anticipated DC Plan:  [place]  CM consult      Choice offered to / List presented to:     DME arranged  Gary [name]      DME agency  [place].        Status of service:  In process, will continue to follow Medicare Important Message given?   (If response is "NO", the following Medicare IM given date fields will be blank) Date Medicare IM given:   Date Additional Medicare IM given:    Discharge Disposition:  HOME/SELF CARE  Per UR Regulation:  Reviewed for med. necessity/level of care/duration of stay  If discussed at [place] of Stay Meetings, dates discussed:   04/24/2013    Comments:  ContactKatrina, [name] [phone]   [phone]  04/24/13 Gary Weichel,RN,BSN [phone] PT REQUESTS RW FOR HOME.  REFERRAL TO [facility] FOR DME NEEDS.  04-21-13 8:45am [place] [phone] Post op Atrial fib - on amio drip.

## 2013-04-24 NOTE — Progress Notes (Signed)
CT sutures removed per order, sites painted and steri's applied.

## 2013-04-24 NOTE — Progress Notes (Signed)
[phone] Pt and wife have seen post op video. Education completed. Understanding voiced. Encouraged IS. Referring to [place] Phase 2 per pt request.  Graylon Good RN BSN 04/24/2013 9:54 AM

## 2013-04-24 NOTE — Progress Notes (Signed)
Pt discharging home with wife.  All instructions and prescriptions given and reviewed, all questions answered.   Await RW, will assist transport to car.

## 2013-04-24 NOTE — Progress Notes (Signed)
Subjective:  Still some atrial fib intermittently.  Discussed with Dr. Servando Snare.  Feels good, not SOB. Mild numbness of left hand.  Objective:  Vital Signs in the last 24 hours: BP 122/67  Pulse 87  Temp(Src) 98 F (36.7 C) (Oral)  Resp 18  Ht 6\' 2"  (1.88 m)  Wt 128.595 kg (283 lb 8 oz)  BMI 36.38 kg/m2  SpO2 94%  Physical Exam: Pleasant WM in NAD Lungs:  Reduced breath sounds Cardiac:  Regular rhythm, normal S1 and S2, no S3 Abdomen:  Soft, nontender, no masses Extremities:  1+ edema present  Intake/Output from previous day: 04/15 0701 - 04/16 0700 In: 720 [P.O.:720] Out: 1075 [Urine:1075]  Weight Filed Weights   04/22/13 0500 04/23/13 0641 04/24/13 0538  Weight: 130.9 kg (288 lb 9.3 oz) 130.001 kg (286 lb 9.6 oz) 128.595 kg (283 lb 8 oz)   Lab Results: Basic Metabolic Panel:  Recent Labs  04/23/13 0320 04/24/13 0225  NA 141 140  K 4.0 3.8  CL 99 96  CO2 29 29  GLUCOSE 102* 111*  BUN 24* 25*  CREATININE 1.29 1.40*   CBC:  Recent Labs  04/23/13 0320 04/24/13 0225  WBC 8.8 9.2  HGB 10.7* 11.1*  HCT 31.9* 33.5*  MCV 96.4 96.3  PLT 251 310   Telemetry: NSR with PAC's and some nonsustained runs of a fib.  Assessment/Plan:  1. Post op a fib now in NSR.  Still some intermittent nonsustained a fib 2. CAD 3. Mild increase in creatinine.   Rec:  I would send home on amiodarone  400 in am and 200 in evening and followup with me in 2 weeks.  Metoprolol 25 BID.   Kerry Hough  MD [facility]  04/24/2013, 9:16 AM

## 2013-04-25 ENCOUNTER — Encounter (HOSPITAL_COMMUNITY): Payer: Self-pay | Admitting: Cardiothoracic Surgery

## 2013-05-07 ENCOUNTER — Observation Stay (HOSPITAL_COMMUNITY)
Admission: EM | Admit: 2013-05-07 | Discharge: 2013-05-08 | Disposition: A | Discharge status: Home / Self Care | Payer: BC Managed Care – PPO | Attending: Cardiology | Admitting: Cardiology

## 2013-05-07 ENCOUNTER — Encounter (HOSPITAL_COMMUNITY): Payer: Self-pay | Admitting: Emergency Medicine

## 2013-05-07 ENCOUNTER — Observation Stay (HOSPITAL_COMMUNITY): Payer: BC Managed Care – PPO

## 2013-05-07 ENCOUNTER — Other Ambulatory Visit: Payer: Self-pay

## 2013-05-07 ENCOUNTER — Emergency Department (HOSPITAL_COMMUNITY): Payer: BC Managed Care – PPO

## 2013-05-07 DIAGNOSIS — E669 Obesity, unspecified: Secondary | ICD-10-CM | POA: Insufficient documentation

## 2013-05-07 DIAGNOSIS — Z87442 Personal history of urinary calculi: Secondary | ICD-10-CM | POA: Insufficient documentation

## 2013-05-07 DIAGNOSIS — R55 Syncope and collapse: Principal | ICD-10-CM | POA: Diagnosis present

## 2013-05-07 DIAGNOSIS — Z951 Presence of aortocoronary bypass graft: Secondary | ICD-10-CM | POA: Insufficient documentation

## 2013-05-07 DIAGNOSIS — I2510 Atherosclerotic heart disease of native coronary artery without angina pectoris: Secondary | ICD-10-CM | POA: Insufficient documentation

## 2013-05-07 DIAGNOSIS — R42 Dizziness and giddiness: Secondary | ICD-10-CM | POA: Insufficient documentation

## 2013-05-07 DIAGNOSIS — Z7982 Long term (current) use of aspirin: Secondary | ICD-10-CM | POA: Insufficient documentation

## 2013-05-07 DIAGNOSIS — I10 Essential (primary) hypertension: Secondary | ICD-10-CM | POA: Insufficient documentation

## 2013-05-07 DIAGNOSIS — Z87891 Personal history of nicotine dependence: Secondary | ICD-10-CM | POA: Insufficient documentation

## 2013-05-07 DIAGNOSIS — M109 Gout, unspecified: Secondary | ICD-10-CM | POA: Insufficient documentation

## 2013-05-07 DIAGNOSIS — Z79899 Other long term (current) drug therapy: Secondary | ICD-10-CM | POA: Insufficient documentation

## 2013-05-07 LAB — D-DIMER, QUANTITATIVE: D-Dimer, Quant: 4.78 ug/mL-FEU — ABNORMAL HIGH (ref 0.00–0.48)

## 2013-05-07 LAB — CBC WITH DIFFERENTIAL/PLATELET
Basophils Absolute: 0 10*3/uL (ref 0.0–0.1)
Basophils Relative: 0 % (ref 0–1)
EOS ABS: 0.1 10*3/uL (ref 0.0–0.7)
Eosinophils Relative: 2 % (ref 0–5)
HCT: 34.4 % — ABNORMAL LOW (ref 39.0–52.0)
Hemoglobin: 11.5 g/dL — ABNORMAL LOW (ref 13.0–17.0)
Lymphocytes Relative: 11 % — ABNORMAL LOW (ref 12–46)
Lymphs Abs: 0.9 10*3/uL (ref 0.7–4.0)
MCH: 31.9 pg (ref 26.0–34.0)
MCHC: 33.4 g/dL (ref 30.0–36.0)
MCV: 95.3 fL (ref 78.0–100.0)
MONOS PCT: 8 % (ref 3–12)
Monocytes Absolute: 0.6 10*3/uL (ref 0.1–1.0)
NEUTROS ABS: 6.7 10*3/uL (ref 1.7–7.7)
Neutrophils Relative %: 79 % — ABNORMAL HIGH (ref 43–77)
Platelets: 396 10*3/uL (ref 150–400)
RBC: 3.61 MIL/uL — AB (ref 4.22–5.81)
RDW: 13 % (ref 11.5–15.5)
WBC: 8.4 10*3/uL (ref 4.0–10.5)

## 2013-05-07 LAB — COMPREHENSIVE METABOLIC PANEL
ALK PHOS: 71 U/L (ref 39–117)
ALT: 17 U/L (ref 0–53)
AST: 15 U/L (ref 0–37)
Albumin: 3.4 g/dL — ABNORMAL LOW (ref 3.5–5.2)
BILIRUBIN TOTAL: 0.5 mg/dL (ref 0.3–1.2)
BUN: 23 mg/dL (ref 6–23)
CHLORIDE: 103 meq/L (ref 96–112)
CO2: 23 meq/L (ref 19–32)
Calcium: 9 mg/dL (ref 8.4–10.5)
Creatinine, Ser: 1.44 mg/dL — ABNORMAL HIGH (ref 0.50–1.35)
GFR calc non Af Amer: 53 mL/min — ABNORMAL LOW (ref >90)
GFR, EST AFRICAN AMERICAN: 61 mL/min — AB (ref >90)
GLUCOSE: 106 mg/dL — AB (ref 70–99)
POTASSIUM: 4.6 meq/L (ref 3.7–5.3)
Sodium: 141 mEq/L (ref 137–147)
Total Protein: 6.7 g/dL (ref 6.0–8.3)

## 2013-05-07 LAB — URINALYSIS, ROUTINE W REFLEX MICROSCOPIC
BILIRUBIN URINE: NEGATIVE
Glucose, UA: NEGATIVE mg/dL
HGB URINE DIPSTICK: NEGATIVE
KETONES UR: NEGATIVE mg/dL
Leukocytes, UA: NEGATIVE
Nitrite: NEGATIVE
Protein, ur: NEGATIVE mg/dL
Specific Gravity, Urine: 1.018 (ref 1.005–1.030)
UROBILINOGEN UA: 1 mg/dL (ref 0.0–1.0)
pH: 8 (ref 5.0–8.0)

## 2013-05-07 LAB — TROPONIN I
Troponin I: <0.3 ng/mL (ref <0.30)
Troponin I: <0.3 ng/mL (ref <0.30)

## 2013-05-07 MED ORDER — CARBOXYMETHYLCELLULOSE SODIUM 1 % OP SOLN: 1.0000 [drp] | OPHTHALMIC | Status: DC | PRN

## 2013-05-07 MED ORDER — SODIUM CHLORIDE 0.9 % IJ SOLN: 3.0000 mL | INTRAMUSCULAR | Status: DC | PRN

## 2013-05-07 MED ORDER — ATORVASTATIN CALCIUM 10 MG PO TABS
10.0000 mg | ORAL_TABLET | Freq: Every day | ORAL | Status: DC
  Administered 2013-05-07: 10 mg via ORAL
  Filled 2013-05-07 (×2): qty 1

## 2013-05-07 MED ORDER — NITROGLYCERIN 0.4 MG SL SUBL: 0.4000 mg | SUBLINGUAL_TABLET | SUBLINGUAL | Status: DC | PRN

## 2013-05-07 MED ORDER — SODIUM CHLORIDE 0.9 % IV BOLUS (SEPSIS)
250.0000 mL | Freq: Once | INTRAVENOUS | Status: AC
  Administered 2013-05-07: 250 mL via INTRAVENOUS

## 2013-05-07 MED ORDER — ENOXAPARIN SODIUM 40 MG/0.4ML ~~LOC~~ SOLN
40.0000 mg | SUBCUTANEOUS | Status: DC
  Administered 2013-05-07: 40 mg via SUBCUTANEOUS
  Filled 2013-05-07 (×2): qty 0.4

## 2013-05-07 MED ORDER — ASPIRIN EC 325 MG PO TBEC
325.0000 mg | DELAYED_RELEASE_TABLET | Freq: Every day | ORAL | Status: DC
  Administered 2013-05-07 – 2013-05-08 (×2): 325 mg via ORAL
  Filled 2013-05-07 (×2): qty 1

## 2013-05-07 MED ORDER — POLYVINYL ALCOHOL 1.4 % OP SOLN
1.0000 [drp] | OPHTHALMIC | Status: DC | PRN
  Filled 2013-05-07: qty 15

## 2013-05-07 MED ORDER — AMIODARONE HCL 200 MG PO TABS
200.0000 mg | ORAL_TABLET | Freq: Every day | ORAL | Status: DC
  Administered 2013-05-07 – 2013-05-08 (×2): 200 mg via ORAL
  Filled 2013-05-07 (×2): qty 1

## 2013-05-07 MED ORDER — SODIUM CHLORIDE 0.9 % IV SOLN: 250.0000 mL | INTRAVENOUS | Status: DC | PRN

## 2013-05-07 MED ORDER — SODIUM CHLORIDE 0.9 % IJ SOLN
3.0000 mL | Freq: Two times a day (BID) | INTRAMUSCULAR | Status: DC
  Administered 2013-05-07: 3 mL via INTRAVENOUS

## 2013-05-07 MED ORDER — TECHNETIUM TO 99M ALBUMIN AGGREGATED
6.0000 | Freq: Once | INTRAVENOUS | Status: AC | PRN
  Administered 2013-05-07: 6 via INTRAVENOUS

## 2013-05-07 MED ORDER — OXYCODONE HCL 5 MG PO TABS
5.0000 mg | ORAL_TABLET | ORAL | Status: DC | PRN
  Administered 2013-05-07: 10 mg via ORAL
  Filled 2013-05-07: qty 2

## 2013-05-07 MED ORDER — METOPROLOL TARTRATE 25 MG PO TABS
25.0000 mg | ORAL_TABLET | Freq: Two times a day (BID) | ORAL | Status: DC
  Filled 2013-05-07 (×3): qty 1

## 2013-05-07 MED ORDER — ALLOPURINOL 300 MG PO TABS
300.0000 mg | ORAL_TABLET | Freq: Every day | ORAL | Status: DC
  Administered 2013-05-07 – 2013-05-08 (×2): 300 mg via ORAL
  Filled 2013-05-07 (×2): qty 1

## 2013-05-07 MED ORDER — TECHNETIUM TC 99M DIETHYLENETRIAME-PENTAACETIC ACID: 40.0000 | Freq: Once | INTRAVENOUS | Status: AC | PRN

## 2013-05-07 NOTE — ED Notes (Signed)
Attempted IV x1. Unable to gain access.

## 2013-05-07 NOTE — Consult Note (Addendum)
Cardiology Admission Note  Admit date: 05/07/2013 Name: Gary [name] 58 y.o.  male DOB:  12-15-1955 MRN:  [id number]  Today's date:  05/07/2013  Referring Physician:    [facility]  Primary Physician:    Dr. Ashby Dawes  Reason for Consultation:    Passed out  IMPRESSIONS: 1. Syncope that may have been vasovagal due to orthostasis 2. Recent coronary bypass grafting 3. History of atrial fibrillation currently in sinus rhythm on amiodarone 4. Mild renal insufficiency 5. History of hypertension now with relative hypotension 6. Elevated d-dimer nonspecific but need to rule out pulmonary embolus   RECOMMENDATION: After evaluation in the emergency room he is still running borderline low blood pressures. Troponins are negative but a d-dimer returned abnormal. He is to have a ventilation perfusion lung scan. In view of the borderline blood pressures and clinical presentation would like to keep him for observation overnight and watched on telemetry. If stable may go home in the morning.  HISTORY: This 58 year-old male had bypass grafting on April 10 for severe three-vessel disease. His postoperative course was reasonable except for some atrial fibrillation and he was discharged on April 15. He had been doing well since discharge and last evening took a couple of pain pills. This morning he was standing up attending to his incisions and all of a sudden became somewhat hot and slumped to the floor and his eyes rolled back. EMS was called and did an EKG that showed him to be in sinus rhythm. His blood pressure and blood sugar were fine. His wife contacted me at the office and advised her to bring him to the emergency room for evaluation. His blood pressure is been somewhat soft here. Initial enzymes negative and lab was unremarkable. He currently feels relatively well except has been mildly orthostatic when he first sits up. He was given some saline. Lab has been largely unchanged since  he was in the hospital. He denies angina and has no PND or orthopnea. He does not have any soreness in his legs. A d-dimer returned abnormal after he had been given 500 cc of saline and he is brought in to the hospital for further evaluation.  Past Medical History  Diagnosis Date  . Essential hypertension   . Gout   . Obesity (BMI 30-39.9)   . CAD (coronary artery disease)     Coronary calcification noted on CT scan in 2013   . Hypertension   . Kidney stones   . Obesity (BMI 30-39.9) 04/15/2013  . CAD (coronary artery disease), native coronary artery     Coronary calcifications noted on CT scan in 2013        Past Surgical History  Procedure Laterality Date  . Kidney stone surgery    . Coronary artery bypass graft N/A 04/18/2013    Procedure: CORONARY ARTERY BYPASS GRAFTING (CABG) TIMES SIX USING LEFT INTERNAL MAMMARY ARTERY AND RIGHT SAPHENOUS LEG VEIN HARVESTED ENDOSCOPICALLY;  Surgeon: [name], MD;  Location: [place];  Service: Open Heart Surgery;  Laterality: N/A;  . Intraoperative transesophageal echocardiogram N/A 04/18/2013    Procedure: INTRAOPERATIVE TRANSESOPHAGEAL ECHOCARDIOGRAM;  Surgeon: [name], MD;  Location: [place];  Service: Open Heart Surgery;  Laterality: N/A;     Allergies:  is allergic to contrast media and contrast media.   Medications: Prior to Admission medications   Medication Sig Start Date End Date Taking? Authorizing Provider  allopurinol (ZYLOPRIM) 100 MG tablet Take 400 mg by mouth daily. Take 100  mg along with 300 mg to equal 400 mg   Yes Historical Provider, MD  allopurinol (ZYLOPRIM) 300 MG tablet Take 300 mg by mouth daily. Take 300 mg along with 100 mg to equal 400 mg   Yes Historical Provider, MD  amiodarone (PACERONE) 400 MG tablet Take 1 tablet (400 mg total) by mouth 2 (two) times daily. For 7 days, then take 400mg  once daily 04/24/13  Yes Wayne E Gold, PA-C  Ascorbic Acid (VITAMIN C) 1000 MG tablet Take 2,000 mg by mouth daily.    Yes Historical Provider, MD  aspirin EC 325 MG EC tablet Take 1 tablet (325 mg total) by mouth daily. 04/24/13  Yes Wayne E Gold, PA-C  atorvastatin (LIPITOR) 10 MG tablet Take 1 tablet (10 mg total) by mouth daily at 6 PM. 04/24/13  Yes Wayne E Gold, PA-C  furosemide (LASIX) 40 MG tablet Take 1 tablet (40 mg total) by mouth daily. 04/24/13  Yes Wayne E Gold, PA-C  irbesartan (AVAPRO) 150 MG tablet Take 150 mg by mouth daily.   Yes Historical Provider, MD  metoprolol tartrate (LOPRESSOR) 25 MG tablet Take 1 tablet (25 mg total) by mouth 2 (two) times daily. 04/24/13  Yes Wayne E Gold, PA-C  Multiple Vitamins-Minerals (CENTRUM SILVER PO) Take 1 tablet by mouth daily.   Yes Historical Provider, MD  oxyCODONE (OXY IR/ROXICODONE) 5 MG immediate release tablet Take 1-2 tablets (5-10 mg total) by mouth every 4 (four) hours as needed for moderate pain. 04/24/13  Yes Wayne E Gold, PA-C  Polyvinyl Alcohol-Povidone (REFRESH OP) Place 1 drop into both eyes 2 (two) times daily as needed (dry eyes).   Yes Historical Provider, MD  potassium chloride SA (K-DUR,KLOR-CON) 20 MEQ tablet Take 1 tablet (20 mEq total) by mouth daily. 04/24/13  Yes Wayne E Gold, PA-C  tamsulosin (FLOMAX) 0.4 MG CAPS capsule Take 0.4 mg by mouth daily.   Yes Historical Provider, MD    Family History: Family Status  Relation Status Death Age  . Father Deceased 68    premature CAD age 86, died of cancer  . Mother Alive   . Brother Deceased 41    died of melanoma and brain cancer  . Brother Alive     Social History:   reports that he has quit smoking. He has never used smokeless tobacco. He reports that he does not drink alcohol or use illicit drugs.   History   Social History Narrative   ** Merged History Encounter **        Review of Systems: Otherwise unremarkable except as noted above  Physical Exam: BP 108/67  Pulse 66  Temp(Src) 97.7 F (36.5 C) (Oral)  Resp 14  Ht 6\' 2"  (1.88 m)  Wt 126.1 kg (278 lb)  BMI 35.68  kg/m2  SpO2 93%  General appearance: Pleasant obese white male in no acute distress Head: Normocephalic, without obvious abnormality, atraumatic Lungs: clear to auscultation bilaterally Heart: regular rate and rhythm, S1, S2 normal, no murmur, click, rub or gallop Abdomen: soft, non-tender; bowel sounds normal; no masses,  no organomegaly Extremities: extremities normal, atraumatic, no cyanosis or edema Pulses: 2+ and symmetric Skin: Skin color, texture, turgor normal. No rashes or lesions Neurologic: Grossly normal  Labs: CBC  Recent Labs  05/07/13 1204  WBC 8.4  RBC 3.61*  HGB 11.5*  HCT 34.4*  PLT 396  MCV 95.3  MCH 31.9  MCHC 33.4  RDW 13.0  LYMPHSABS 0.9  MONOABS 0.6  EOSABS 0.1  BASOSABS 0.0   CMP   Recent Labs  05/07/13 1204  NA 141  K 4.6  CL 103  CO2 23  GLUCOSE 106*  BUN 23  CREATININE 1.44*  CALCIUM 9.0  PROT 6.7  ALBUMIN 3.4*  AST 15  ALT 17  ALKPHOS 71  BILITOT 0.5  GFRNONAA 53*  GFRAA 61*   Cardiac Panel (last 3 results)  Recent Labs  05/07/13 1204  TROPONINI <0.30     Radiology: Left pleural effusion, clear lung fields, mild cardiomegaly  EKG: Sinus rhythm with inferolateral T wave inversions  Signed:  W. Doristine Church MD [facility]   Cardiology Consultant  05/07/2013, 3:02 PM

## 2013-05-07 NOTE — ED Notes (Signed)
Patient transported to X-ray 

## 2013-05-07 NOTE — ED Notes (Signed)
Patient complains of feeling lightheaded and dizzy. Patient reposition in the bed and feet raised. Patient states the feeling is subsided. Vital signs taken. Patient in no acute distress. Vital Signs WNL.

## 2013-05-07 NOTE — ED Notes (Signed)
Patient given urinal to obtain urine sample.

## 2013-05-07 NOTE — ED Notes (Signed)
Lexine Baton, RN at the bedside attempting [place] IV.

## 2013-05-07 NOTE — ED Notes (Signed)
Cardiologist Wynonia Lawman at the bedside.

## 2013-05-07 NOTE — ED Notes (Signed)
Dr. Tilley at the bedside.  

## 2013-05-07 NOTE — ED Notes (Addendum)
Per EMS, Patient had a [place] ByPass placed three weeks ago. Upon doing housework, he started to feel dizzy and lightheaded. Patient rested and sat on the bed. Patient stated he tried to get up and get dressed and then felt like he was going to pass out. Patient complains of "feeling clammy" as well. Vitals per EMS: 104/72, NSR 64 HR, 98 % on RA, 126 CBG, 14 RR.

## 2013-05-07 NOTE — ED Notes (Signed)
Dr. Pickering at the bedside.  

## 2013-05-07 NOTE — ED Provider Notes (Signed)
CSN: [id number]     Arrival date & time 05/07/13  1131 History   First MD Initiated Contact with Patient 05/07/13 1143     Chief Complaint  Patient presents with  . Near Syncope     (Consider location/radiation/quality/duration/timing/severity/associated sxs/prior Treatment) Patient is a 58 y.o. male presenting with near-syncope. The history is provided by the patient.  Near Syncope This is a new problem. Pertinent negatives include no chest pain, no abdominal pain, no headaches and no shortness of breath.   patient had a coronary artery bypass graft around 2 weeks ago. He states his been doing well with that has been increasing his exercise daily. He states today he was up brushing his teeth he began to feel lightheaded. States he felt like he is on a pass out. There was no chest pain. No fevers. He feels somewhat better now. He's been doing well the last few days. His been eating and drinking well. No bleeding. No cough. No swelling in his legs.  Past Medical History  Diagnosis Date  . Essential hypertension   . Gout   . Obesity (BMI 30-39.9)   . CAD (coronary artery disease)     Coronary calcification noted on CT scan in 2013   . Hypertension   . Kidney stones   . Obesity (BMI 30-39.9) 04/15/2013  . CAD (coronary artery disease), native coronary artery     Coronary calcifications noted on CT scan in 2013     Past Surgical History  Procedure Laterality Date  . Kidney stone surgery    . Coronary artery bypass graft N/A 04/18/2013    Procedure: CORONARY ARTERY BYPASS GRAFTING (CABG) TIMES SIX USING LEFT INTERNAL MAMMARY ARTERY AND RIGHT SAPHENOUS LEG VEIN HARVESTED ENDOSCOPICALLY;  Surgeon: [name], MD;  Location: [facility];  Service: Open Heart Surgery;  Laterality: N/A;  . Intraoperative transesophageal echocardiogram N/A 04/18/2013    Procedure: INTRAOPERATIVE TRANSESOPHAGEAL ECHOCARDIOGRAM;  Surgeon: [name], MD;  Location: [facility];  Service: Open Heart Surgery;   Laterality: N/A;   No family history on file. History  Substance Use Topics  . Smoking status: Former Smoker -- 25 years  . Smokeless tobacco: Never Used  . Alcohol Use: No    Review of Systems  Constitutional: Negative for activity change and appetite change.  Eyes: Negative for pain.  Respiratory: Negative for chest tightness and shortness of breath.   Cardiovascular: Positive for near-syncope. Negative for chest pain and leg swelling.  Gastrointestinal: Negative for nausea, vomiting, abdominal pain and diarrhea.  Genitourinary: Negative for flank pain.  Musculoskeletal: Negative for back pain and neck stiffness.  Skin: Negative for rash.  Neurological: Positive for light-headedness. Negative for syncope, weakness, numbness and headaches.  Psychiatric/Behavioral: Negative for behavioral problems.      Allergies  Contrast media and Contrast media  Home Medications   Prior to Admission medications   Medication Sig Start Date End Date Taking? Authorizing Provider  allopurinol (ZYLOPRIM) 100 MG tablet Take 400 mg by mouth daily. Take 100 mg along with 300 mg to equal 400 mg   Yes Historical Provider, MD  allopurinol (ZYLOPRIM) 300 MG tablet Take 300 mg by mouth daily. Take 300 mg along with 100 mg to equal 400 mg   Yes Historical Provider, MD  amiodarone (PACERONE) 400 MG tablet Take 1 tablet (400 mg total) by mouth 2 (two) times daily. For 7 days, then take 400mg  once daily 04/24/13  Yes Wayne E Gold, PA-C  Ascorbic Acid (VITAMIN C)  1000 MG tablet Take 2,000 mg by mouth daily.   Yes Historical Provider, MD  aspirin EC 325 MG EC tablet Take 1 tablet (325 mg total) by mouth daily. 04/24/13  Yes Wayne E Gold, PA-C  atorvastatin (LIPITOR) 10 MG tablet Take 1 tablet (10 mg total) by mouth daily at 6 PM. 04/24/13  Yes Wayne E Gold, PA-C  furosemide (LASIX) 40 MG tablet Take 1 tablet (40 mg total) by mouth daily. 04/24/13  Yes Wayne E Gold, PA-C  irbesartan (AVAPRO) 150 MG tablet Take 150  mg by mouth daily.   Yes Historical Provider, MD  metoprolol tartrate (LOPRESSOR) 25 MG tablet Take 1 tablet (25 mg total) by mouth 2 (two) times daily. 04/24/13  Yes Wayne E Gold, PA-C  Multiple Vitamins-Minerals (CENTRUM SILVER PO) Take 1 tablet by mouth daily.   Yes Historical Provider, MD  oxyCODONE (OXY IR/ROXICODONE) 5 MG immediate release tablet Take 1-2 tablets (5-10 mg total) by mouth every 4 (four) hours as needed for moderate pain. 04/24/13  Yes Wayne E Gold, PA-C  Polyvinyl Alcohol-Povidone (REFRESH OP) Place 1 drop into both eyes 2 (two) times daily as needed (dry eyes).   Yes Historical Provider, MD  potassium chloride SA (K-DUR,KLOR-CON) 20 MEQ tablet Take 1 tablet (20 mEq total) by mouth daily. 04/24/13  Yes Wayne E Gold, PA-C  tamsulosin (FLOMAX) 0.4 MG CAPS capsule Take 0.4 mg by mouth daily.   Yes Historical Provider, MD   BP 108/67  Pulse 66  Temp(Src) 97.7 F (36.5 C) (Oral)  Resp 14  Ht 6\' 2"  (1.88 m)  Wt 278 lb (126.1 kg)  BMI 35.68 kg/m2  SpO2 93% Physical Exam  Nursing note and vitals reviewed. Constitutional: He is oriented to person, place, and time. He appears well-developed and well-nourished.  HENT:  Head: Normocephalic and atraumatic.  Eyes: EOM are normal. Pupils are equal, round, and reactive to light.  Neck: Normal range of motion. Neck supple.  Cardiovascular: Normal rate, regular rhythm and normal heart sounds.   No murmur heard. Pulmonary/Chest: Effort normal and breath sounds normal.  Abdominal: Soft. Bowel sounds are normal. He exhibits no distension and no mass. There is no tenderness. There is no rebound and no guarding.  Musculoskeletal: Normal range of motion. He exhibits no edema.  Neurological: He is alert and oriented to person, place, and time. No cranial nerve deficit.  Skin: Skin is warm and dry.  Psychiatric: He has a normal mood and affect.    ED Course  Procedures (including critical care time) Labs Review Labs Reviewed  CBC WITH  DIFFERENTIAL - Abnormal; Notable for the following:    RBC 3.61 (*)    Hemoglobin 11.5 (*)    HCT 34.4 (*)    Neutrophils Relative % 79 (*)    Lymphocytes Relative 11 (*)    All other components within normal limits  COMPREHENSIVE METABOLIC PANEL - Abnormal; Notable for the following:    Glucose, Bld 106 (*)    Creatinine, Ser 1.44 (*)    Albumin 3.4 (*)    GFR calc non Af Amer 53 (*)    GFR calc Af Amer 61 (*)    All other components within normal limits  TROPONIN I  URINALYSIS, ROUTINE W REFLEX MICROSCOPIC  D-DIMER, QUANTITATIVE  TROPONIN I    Imaging Review Dg Chest 2 View  05/07/2013   CLINICAL DATA:  Near syncope.  Low blood pressure.  EXAM: CHEST  2 VIEW  COMPARISON:  04/24/2013  FINDINGS: Sequelae  of prior CABG are again identified. Cardiac silhouette remains mildly enlarged. Small left pleural effusion is unchanged. There is no evidence of new airspace consolidation, edema, or pneumothorax. No acute osseous abnormality is identified.  IMPRESSION: Unchanged left pleural effusion. No evidence of acute airspace disease.   Electronically Signed   By: Logan Bores   On: 05/07/2013 12:51     EKG Interpretation None      Date: 05/07/2013  Rate: 68  Rhythm: normal sinus rhythm  QRS Axis: normal  Intervals: normal  ST/T Wave abnormalities: t waves downgoing inferiorly and laterally  Conduction Disutrbances:none  Narrative Interpretation:   Old EKG Reviewed: changes noted   MDM   Final diagnoses:  None    Patient with near syncope. Recent CABG. His weight is down and his creatinine is slightly up. May be overly diuresis. EKG shows some inferior and lateral T wave changes. He discussed with his cardiologist Dr. Wynonia Lawman, who recommended a second troponin, slightly more fluid bolus, and d-dimer. All are negative he will likely be able go home and following office tomorrow. He'll stop his Flomax, Lasix, and potassium.    Jasper Riling. Alvino Chapel, MD 05/07/13 1536

## 2013-05-07 NOTE — ED Notes (Signed)
Spoke with Cardiologist Dr. Wynonia Lawman. Asked to give patient bolus, food, and something to drink. Once that is complete, let patient have orthostatics complete and monitor patient.

## 2013-05-08 ENCOUNTER — Other Ambulatory Visit: Payer: Self-pay | Admitting: Cardiothoracic Surgery

## 2013-05-08 ENCOUNTER — Other Ambulatory Visit: Payer: Self-pay

## 2013-05-08 DIAGNOSIS — I4891 Unspecified atrial fibrillation: Secondary | ICD-10-CM

## 2013-05-08 LAB — BASIC METABOLIC PANEL
BUN: 23 mg/dL (ref 6–23)
CALCIUM: 8.9 mg/dL (ref 8.4–10.5)
CO2: 23 mEq/L (ref 19–32)
CREATININE: 1.31 mg/dL (ref 0.50–1.35)
Chloride: 105 mEq/L (ref 96–112)
GFR calc non Af Amer: 59 mL/min — ABNORMAL LOW (ref >90)
GFR, EST AFRICAN AMERICAN: 69 mL/min — AB (ref >90)
GLUCOSE: 101 mg/dL — AB (ref 70–99)
POTASSIUM: 4.3 meq/L (ref 3.7–5.3)
Sodium: 142 mEq/L (ref 137–147)

## 2013-05-08 MED ORDER — AMIODARONE HCL 400 MG PO TABS: 200.0000 mg | ORAL_TABLET | Freq: Every day | ORAL | Status: DC

## 2013-05-08 MED ORDER — ZOLPIDEM TARTRATE 10 MG PO TABS: 10.0000 mg | ORAL_TABLET | Freq: Every evening | ORAL | Status: DC | PRN

## 2013-05-08 MED ORDER — AMIODARONE HCL 200 MG PO TABS: 200.0000 mg | ORAL_TABLET | Freq: Every day | ORAL | Status: DC

## 2013-05-08 MED ORDER — ZOLPIDEM TARTRATE 5 MG PO TABS: 10.0000 mg | ORAL_TABLET | Freq: Every evening | ORAL | Status: DC | PRN

## 2013-05-08 NOTE — Discharge Summary (Signed)
Physician Discharge Summary  Patient ID: Gary [name] MRN: [id number] DOB/AGE: [birth year] 57 y.o.  Admit date: 05/07/2013 Discharge date: 05/08/2013  Primary Physician:  Dr. Ashby Dawes  Primary Discharge Diagnosis:  1. Syncope likely vasovagal due to orthostasis and dehydration  Secondary Discharge Diagnosis: 2. Coronary artery disease with recent bypass grafting 3. Mild renal insufficiency 4. Obesity 5. Glucose intolerance  Hospital Course: This 58 year-old male had bypass surgery on April 10. He had been doing well at home but has had difficulty sleeping and also had difficulty with some mild pain. He was in his usual state of health and was standing for about 5-10 minutes while his wife with his wounds. He then became lightheaded dizzy and slumped back and his eyes rolled back. EMS was called and he was brought to the emergency room. He was found to be mildly orthostatic in the emergency room and had an elevated d-dimer. Ventilation perfusion lung scan was unremarkable. He was given a total of 500 cc of saline and we opted to keep him overnight. He was feeling well the next morning and was able to torn the [place] without further orthostasis or dizziness. He will be given a prescription for Restoril to take at night if needed for sleep.  It was thought that he may have had some dehydration from the furosemide and may have had a vagal episode that led to his admission. He will follow up with Dr. Servando Snare next week and was encouraged to stop his Flomax, Lasix and potassium. He will call if there are recurrent problems.  Discharge Exam: Blood pressure 110/69, pulse 66, temperature 97.8 F (36.6 C), temperature source Oral, resp. rate 17, height 6\' 2"  (1.88 m), weight 128.005 kg (282 lb 3.2 oz), SpO2 96.00%. Weight: 128.005 kg (282 lb 3.2 oz) Lungs clear, no S3  Labs: CBC:   Lab Results  Component Value Date   WBC 8.4 05/07/2013   HGB 11.5* 05/07/2013   HCT 34.4* 05/07/2013   MCV 95.3  05/07/2013   PLT 396 05/07/2013   CMP:  Recent Labs Lab 05/07/13 1204 05/08/13 0610  NA 141 142  K 4.6 4.3  CL 103 105  CO2 23 23  BUN 23 23  CREATININE 1.44* 1.31  CALCIUM 9.0 8.9  PROT 6.7  --   BILITOT 0.5  --   ALKPHOS 71  --   ALT 17  --   AST 15  --   GLUCOSE 106* 101*   Cardiac Enzymes:  Recent Labs  05/07/13 1204 05/07/13 1603  TROPONINI <0.30 <0.30   Thyroid: Lab Results  Component Value Date   TSH 0.997 04/20/2013    Hemoglobin A1C: Lab Results  Component Value Date   HGBA1C 6.1* 04/17/2013    Radiology: Lungs clear on chest x-ray except for small left pleural effusion, ventilation perfusion lung scan was negative for pulmonary embolus  EKG: Sinus rhythm with inferolateral T wave inversions  Discharge Medications:   Medication List    STOP taking these medications       furosemide 40 MG tablet  Commonly known as:  LASIX     irbesartan 150 MG tablet  Commonly known as:  AVAPRO     potassium chloride SA 20 MEQ tablet  Commonly known as:  K-DUR,KLOR-CON     tamsulosin 0.4 MG Caps capsule  Commonly known as:  FLOMAX      TAKE these medications       allopurinol 300 MG tablet  Commonly known as:  ZYLOPRIM  Take 300 mg by mouth daily. Take 300 mg along with 100 mg to equal 400 mg     amiodarone 200 MG tablet  Commonly known as:  PACERONE  Take 1 tablet (200 mg total) by mouth daily.     aspirin 325 MG EC tablet  Take 1 tablet (325 mg total) by mouth daily.     atorvastatin 10 MG tablet  Commonly known as:  LIPITOR  Take 1 tablet (10 mg total) by mouth daily at 6 PM.     CENTRUM SILVER PO  Take 1 tablet by mouth daily.     metoprolol tartrate 25 MG tablet  Commonly known as:  LOPRESSOR  Take 1 tablet (25 mg total) by mouth 2 (two) times daily.     oxyCODONE 5 MG immediate release tablet  Commonly known as:  Oxy IR/ROXICODONE  Take 1-2 tablets (5-10 mg total) by mouth every 4 (four) hours as needed for moderate pain.      REFRESH OP  Place 1 drop into both eyes 2 (two) times daily as needed (dry eyes).     vitamin C 1000 MG tablet  Take 2,000 mg by mouth daily.       Followup plans and appointments: Followup with Dr. Servando Snare for his usual pulmonary suite, come and see me in 2 weeks.  Time spent with patient to include physician time:  30 minutes  Signed: Kerry Hough. MD [facility] 05/08/2013, 9:23 AM

## 2013-05-08 NOTE — Progress Notes (Signed)
UR Completed.  Reene Harlacher Jane Gabrielly Mccrystal [phone] 05/08/2013  

## 2013-05-08 NOTE — Progress Notes (Signed)
CARDIAC REHAB PHASE I   PRE:  Rate/Rhythm: 71 SR    BP: lying 106/70, sitting 94/64, sitting after 1 min 120/72, standing 80/60, standing after 1 min100/70    SaO2:   MODE:  Ambulation: 40 ft, then 500 ft   POST:  Rate/Rhythm: 84 SR    BP: sitting 110/70     SaO2:   Pt had significant dizziness moving to EOB with decrease in BP. BP normalized after 1-2 min, dizziness better. BP dropped again with standing but pt stated his head felt better (see above). Walked about 40 ft in the hall slowly, felt lightheaded (pt also nervous) therefore returned to room. BP 110/70 therefore he walked again entire length of hall. No problems, able to increase pace. No more c/o dizziness. BP stable upon return. Discussed walking guidelines at home and CRPII.  [place], ACSM 05/08/2013 9:54 AM

## 2013-05-09 ENCOUNTER — Encounter: Payer: Self-pay | Admitting: Cardiology

## 2013-05-09 DIAGNOSIS — E785 Hyperlipidemia, unspecified: Secondary | ICD-10-CM | POA: Insufficient documentation

## 2013-05-12 ENCOUNTER — Ambulatory Visit
Admission: RE | Admit: 2013-05-12 | Discharge: 2013-05-12 | Disposition: A | Discharge status: Home / Self Care | Payer: BC Managed Care – PPO | Source: Ambulatory Visit | Attending: Cardiothoracic Surgery | Admitting: Cardiothoracic Surgery

## 2013-05-12 ENCOUNTER — Ambulatory Visit (INDEPENDENT_AMBULATORY_CARE_PROVIDER_SITE_OTHER): Payer: Self-pay | Admitting: Physician Assistant

## 2013-05-12 VITALS — BP 100/67 | HR 63 | Resp 16 | Ht 74.0 in | Wt 279.0 lb

## 2013-05-12 DIAGNOSIS — I2510 Atherosclerotic heart disease of native coronary artery without angina pectoris: Secondary | ICD-10-CM

## 2013-05-12 DIAGNOSIS — I4891 Unspecified atrial fibrillation: Secondary | ICD-10-CM

## 2013-05-12 DIAGNOSIS — Z951 Presence of aortocoronary bypass graft: Secondary | ICD-10-CM

## 2013-05-12 DIAGNOSIS — I9789 Other postprocedural complications and disorders of the circulatory system, not elsewhere classified: Secondary | ICD-10-CM

## 2013-05-12 DIAGNOSIS — I519 Heart disease, unspecified: Secondary | ICD-10-CM

## 2013-05-12 NOTE — Progress Notes (Signed)
[address]       [place] [address]             [phone]          HPI: Patient returns for routine postoperative follow-up having undergone CABG x 6 by Dr. Servando Snare on 04/18/2013.  The patient's postoperative course was notable for atrial fibrillation, which converted to sinus rhythm on Amiodarone.  He was able to be discharged home on 04/23/2013 in good condition.  The patient was readmitted to [facility] form 4/29-30 after he sustained a syncopal episode. He had been progressing well overall, but developed lightheadedness after standing on his feet for 5-10 minutes.  He passed out and was brought in for further evaluation.  He was noted to be orthostatic on admission with an elevated d-dimer, but a VQ scan was negative for PE.  EKG and other tests were within normal limits.  It was felt that he had a vagal episode due to dehydration, and his Lasix, potassium and Flomax were discontinued.    Since his discharge, the patient has done well.  He has had no further dizziness or syncope.  He is walking several times a day without problem.  His appetite is improving, and he is having much less pain.  He has had some numbness along his lateral hand since surgery, but it is much improved, now only affecting his pinky.  He denies shortness of breath or lower extremity edema.       Current Outpatient Prescriptions  Medication Sig Dispense Refill  . allopurinol (ZYLOPRIM) 300 MG tablet Take 300 mg by mouth daily. Take 300 mg along with 100 mg to equal 400 mg      . amiodarone (PACERONE) 200 MG tablet Take 1 tablet (200 mg total) by mouth daily.      . Ascorbic Acid (VITAMIN C) 1000 MG tablet Take 2,000 mg by mouth daily.      Marland Kitchen aspirin EC 325 MG EC tablet Take 1 tablet (325 mg total) by mouth daily.      Marland Kitchen atorvastatin (LIPITOR) 10 MG tablet Take 1 tablet (10 mg total) by mouth daily at 6 PM.  30 tablet  1  . metoprolol tartrate (LOPRESSOR) 25 MG tablet Take 1 tablet (25  mg total) by mouth 2 (two) times daily.  60 tablet  1  . Multiple Vitamins-Minerals (CENTRUM SILVER PO) Take 1 tablet by mouth daily.      Marland Kitchen oxyCODONE (OXY IR/ROXICODONE) 5 MG immediate release tablet Take 1-2 tablets (5-10 mg total) by mouth every 4 (four) hours as needed for moderate pain.  50 tablet  0  . Polyvinyl Alcohol-Povidone (REFRESH OP) Place 1 drop into both eyes 2 (two) times daily as needed (dry eyes).      Marland Kitchen zolpidem (AMBIEN) 10 MG tablet Take 1 tablet (10 mg total) by mouth at bedtime as needed for sleep.  30 tablet  0   No current facility-administered medications for this visit.     Physical Exam: BP 100/67 HR 63 Resp 16 Wounds: Sternal, chest tube and right leg EVH incisions all healing well without erythema or drainage.  Sternum is stable to palpation. Heart: regular rate and rhythm Lungs: Clear Extremities: No lower extremity edema   Diagnostic Tests: Chest xray: Dg Chest 2 View  05/12/2013   CLINICAL DATA:  Recent heart surgery, follow-up.  EXAM: CHEST  2 VIEW  COMPARISON:  DG CHEST 2 VIEW dated 05/07/2013  FINDINGS:  Trachea is midline. Cardiomediastinal silhouette is stable in size in appearance. Small left pleural effusion may be increased minimally in the interval. Left basilar atelectasis. Tiny right pleural effusion, new. Lungs are not edematous. Old left rib fractures.  IMPRESSION: 1. Small left pleural effusion and left basilar volume loss, minimally increased from 05/07/2013. 2. Tiny right pleural effusion, new.   Electronically Signed   By: Lorin Picket M.D.   On: 05/12/2013 13:10       Assessment/Plan: The patient is overall doing well status post CABG.  He did have a syncopal episode which was thought to be related to dehydration, but has had no other issues since diuretics were discontinued.  He denies orthostasis.  His blood pressure today is low normal, but his wife states that it has been running slightly higher at home.  He is maintaining sinus  rhythm, and is otherwise doing well.    He may begin driving at this point, and increasing his activity as tolerated.  He has a follow up appointment with Dr. Wynonia Lawman in 1 week.  He is interested in pursuing cardiac rehab, and I have encouraged him to proceed.  We will not need to see him back on a regular basis, as he is doing well from a surgical standpoint.  We will be happy to see him if any surgical issues arise in the future.

## 2013-05-15 ENCOUNTER — Ambulatory Visit: Payer: BC Managed Care – PPO | Admitting: Cardiothoracic Surgery

## 2013-05-29 ENCOUNTER — Encounter (HOSPITAL_COMMUNITY)
Admission: RE | Admit: 2013-05-29 | Discharge: 2013-05-29 | Disposition: A | Discharge status: Home / Self Care | Payer: BC Managed Care – PPO | Source: Ambulatory Visit | Attending: Cardiology | Admitting: Cardiology

## 2013-05-29 DIAGNOSIS — Z5189 Encounter for other specified aftercare: Secondary | ICD-10-CM | POA: Insufficient documentation

## 2013-05-29 DIAGNOSIS — I2510 Atherosclerotic heart disease of native coronary artery without angina pectoris: Secondary | ICD-10-CM | POA: Insufficient documentation

## 2013-05-29 DIAGNOSIS — E785 Hyperlipidemia, unspecified: Secondary | ICD-10-CM | POA: Insufficient documentation

## 2013-05-29 DIAGNOSIS — I10 Essential (primary) hypertension: Secondary | ICD-10-CM | POA: Insufficient documentation

## 2013-05-29 DIAGNOSIS — I4891 Unspecified atrial fibrillation: Secondary | ICD-10-CM | POA: Insufficient documentation

## 2013-05-29 NOTE — Progress Notes (Signed)
Cardiac Rehab Medication Review by a Pharmacist  Does the patient  feel that his/her medications are working for him/her?  yes  Has the patient been experiencing any side effects to the medications prescribed?  no  Does the patient measure his/her own blood pressure or blood glucose at home?  no   Does the patient have any problems obtaining medications due to transportation or finances?   no  Understanding of regimen: good Understanding of indications: good Potential of compliance: good    Pharmacist comments:  Pleasant 58 yo gentleman with a good understanding of the importance of his medication regimen. He states that he is still somewhat weak and dizzy since his surgery but this has been improving. He asked about magnesium for sleep which I suggested speaking with his cardiologist before use. He uses a pillbox to remember to take his medications and has not missed any medications since his surgery. No other medication related issues were noted.    Gary [name] 05/29/2013 8:43 AM

## 2013-05-31 ENCOUNTER — Telehealth: Payer: Self-pay | Admitting: Adult Health

## 2013-05-31 NOTE — Telephone Encounter (Signed)
[name] is a 58 y/o patient of Dr. Wynonia Lawman who called complaining of fatigue, cold sweats and pain with inspiration. He states that he had a temp of 102 yesterday which has resolved. He still feels week and needed to know what to do.  He is S/P CABG x 6 by Dr. Servando Snare on 04/18/2013. Has been cleared by surgery on 02/16/4130 with no complications and is to follow up with Dr. Wynonia Lawman on Tuesday May 26th. He has not other complaints, no chest pain, no worsening breathing, cough, NV. He has had no recurrence of fever.   I have advised him to seek medical treatment if his fever returns or if he has worsening discomfort in his chest as I am concerned about infection, PNA, or possible CHF. I have explained this to him. He wishes to avoid coming to ER. As this is a long holiday weekend, he was told that he may not have another choice so soon after CABG.   He verbalizes understanding and agrees to come to ER for recurrent or worsening symptoms.

## 2013-06-03 ENCOUNTER — Ambulatory Visit (INDEPENDENT_AMBULATORY_CARE_PROVIDER_SITE_OTHER): Payer: Self-pay | Admitting: Cardiothoracic Surgery

## 2013-06-03 ENCOUNTER — Encounter: Payer: Self-pay | Admitting: Cardiology

## 2013-06-03 ENCOUNTER — Encounter: Payer: Self-pay | Admitting: Cardiothoracic Surgery

## 2013-06-03 ENCOUNTER — Other Ambulatory Visit: Payer: Self-pay | Admitting: Cardiology

## 2013-06-03 ENCOUNTER — Ambulatory Visit
Admission: RE | Admit: 2013-06-03 | Discharge: 2013-06-03 | Disposition: A | Discharge status: Home / Self Care | Payer: BC Managed Care – PPO | Source: Ambulatory Visit | Attending: Cardiology | Admitting: Cardiology

## 2013-06-03 VITALS — BP 109/74 | HR 70 | Temp 97.3°F | Resp 20 | Ht 72.0 in | Wt 282.0 lb

## 2013-06-03 DIAGNOSIS — I2510 Atherosclerotic heart disease of native coronary artery without angina pectoris: Secondary | ICD-10-CM

## 2013-06-03 DIAGNOSIS — R509 Fever, unspecified: Secondary | ICD-10-CM

## 2013-06-03 DIAGNOSIS — R0602 Shortness of breath: Secondary | ICD-10-CM

## 2013-06-03 DIAGNOSIS — Z951 Presence of aortocoronary bypass graft: Secondary | ICD-10-CM

## 2013-06-03 NOTE — Progress Notes (Signed)
[address]       [place] [address]             [phone]      Gary [name] [place] Medical Record #[id number] Date of Birth: [birth year]  Referring: Jacolyn Reedy, MD Primary Care: Merrilee Seashore, MD  Chief Complaint:   POST OP FOLLOW UP 04/18/2013  OPERATIVE REPORT  PREOPERATIVE DIAGNOSIS: Unstable angina.  POSTOPERATIVE DIAGNOSIS: Unstable angina.  SURGICAL PROCEDURE: Coronary artery bypass grafting x6 with the left  internal mammary to the distal left anterior descending coronary artery,  reverse saphenous vein graft to the diagonal coronary artery, reverse  saphenous vein graft to the first and second obtuse marginal, reverse  saphenous vein graft sequentially to the acute marginal and distal  circumflex, with right leg endo vein harvesting.   History of Present Illness:     Patient returns to the office today at the request of Dr. Wynonia Lawman. The patient had noticed increased fatigue over the past several days with "rubbery legs" , he noticed some chills night sweats and a fever several days ago which has not returned. He denies any drainage from his sternum, or any urinary difficulties. He is to start in cardiac rehabilitation tomorrow. Last week he was cutting the grass on his riding lawnmower. He's had no pedal edema.     Past Medical History  Diagnosis Date  . Essential hypertension   . Gout   . Obesity (BMI 30-39.9)   . CAD (coronary artery disease)   . Kidney stones      History  Smoking status  . Former Smoker -- 25 years  Smokeless tobacco  . Never Used    History  Alcohol Use No     Allergies  Allergen Reactions  . Contrast Media [Iodinated Diagnostic Agents] Hives, Shortness Of Breath and Nausea And Vomiting  . Contrast Media [Iodinated Diagnostic Agents]     Current Outpatient Prescriptions  Medication Sig Dispense Refill  . allopurinol (ZYLOPRIM) 100 MG tablet Take 100 mg by mouth daily. Take 100 mg along  with 300 mg to equal 400 mg total daily dose.      . allopurinol (ZYLOPRIM) 300 MG tablet Take 300 mg by mouth daily. Take 300 mg along with 100 mg to equal 400 mg      . Ascorbic Acid (VITAMIN C) 1000 MG tablet Take 2,000 mg by mouth daily.      Marland Kitchen aspirin EC 325 MG EC tablet Take 1 tablet (325 mg total) by mouth daily.      Marland Kitchen atorvastatin (LIPITOR) 10 MG tablet Take 1 tablet (10 mg total) by mouth daily at 6 PM.  30 tablet  1  . metoprolol tartrate (LOPRESSOR) 25 MG tablet Take 25 mg by mouth daily.      . Multiple Vitamins-Minerals (CENTRUM SILVER PO) Take 1 tablet by mouth daily.      Marland Kitchen oxyCODONE (OXY IR/ROXICODONE) 5 MG immediate release tablet Take 1-2 tablets (5-10 mg total) by mouth every 4 (four) hours as needed for moderate pain.  50 tablet  0  . Polyvinyl Alcohol-Povidone (REFRESH OP) Place 1 drop into both eyes 2 (two) times daily as needed (dry eyes).      Marland Kitchen zolpidem (AMBIEN) 10 MG tablet Take 1 tablet (10 mg total) by mouth at bedtime as needed for sleep.  30 tablet  0   No current facility-administered medications for this visit.       Physical Exam: BP 109/74  Pulse 70  Temp(Src) 97.3 F (36.3 C) (Oral)  Resp 20  Ht 6' (1.829 m)  Wt 282 lb (127.914 kg)  BMI 38.24 kg/m2  SpO2 98%  General appearance: alert, cooperative, appears stated age and no distress Neurologic: intact Heart: regular rate and rhythm, S1, S2 normal, no murmur, click, rub or gallop Lungs: clear to auscultation bilaterally Abdomen: soft, non-tender; bowel sounds normal; no masses,  no organomegaly Extremities: extremities normal, atraumatic, no cyanosis or edema and Homans sign is negative, no sign of DVT Wound: Sternum is stable and well-healed without erythema or evidence of infection, there is no popping or clicking   Diagnostic Studies & Laboratory data:     Recent Radiology Findings:   Dg Chest 2 View  06/03/2013   CLINICAL DATA:  Shortness breath and fever.  EXAM: CHEST  2 VIEW   COMPARISON:  Two-view chest 06/01/2013.  FINDINGS: The patient is status post median sternotomy and for CABG. Mild cardiomegaly is stable. The left pleural effusion has decreased. The right pleural effusion is essentially resolved. Lung volumes remain low. Mild left basilar atelectasis is present. No other focal airspace disease is evident. Remote left-sided rib fractures are again seen.  IMPRESSION: 1. Stable cardiomegaly. 2. Improving left pleural effusion with minimal atelectasis. 3. Resolved right pleural effusion. 4. The lungs are otherwise clear.   Electronically Signed   By: Lawrence Santiago M.D.   On: 06/03/2013 13:19      Recent Lab Findings: Lab Results  Component Value Date   WBC 8.4 05/07/2013   HGB 11.5* 05/07/2013   HCT 34.4* 05/07/2013   PLT 396 05/07/2013   GLUCOSE 101* 05/08/2013   ALT 17 05/07/2013   AST 15 05/07/2013   NA 142 05/08/2013   K 4.3 05/08/2013   CL 105 05/08/2013   CREATININE 1.31 05/08/2013   BUN 23 05/08/2013   CO2 23 05/08/2013   TSH 0.997 04/20/2013   INR 1.27 04/18/2013   HGBA1C 6.1* 04/17/2013   TREADMILL  The patient exercised on the standard [place] protocol a total of 4:30 minutes into [place] stage 2 achieving a workload of 6 METS. The test was stopped due to dyspnea and fatigue. The patient had no chest pain suggestive of angina. Heart rate rose to 96 which was 59% of predicted maximum. Blood pressure response was blunted  12-lead EKG shows nonspecific ST changes at rest. With exercise he did not reach his target heart rate but had no worsening of the ST segment changes.    Assessment / Plan:   1 Patient is to have an echocardiogram tomorrow, some of his symptoms may be post pericardotomy syndrome but he has no rub on physical exam 2 stress test showed no EKG changes, but blunted response to exercise. Echocardiogram to be done tomorrow I agree with Dr. Wynonia Lawman to stop the amiodarone, as this may be the cause of the patient's feeling poorly and "rubbery legs" On  physical exam the patient has no obvious source of infection, lab and urinalysis are still pending Patient will start in cardiac rehabilitation tomorrow after his echocardiogram. I plan to see him back in 3 weeks    [name] MD      [address] [place] [address] Office [phone]   Beeper [phone]  06/03/2013 3:14 PM

## 2013-06-03 NOTE — Progress Notes (Unsigned)
Patient ID: Gary [name], male   DOB: 1955-02-09, 58 y.o.   MRN: [id number]  Ansen, Sayegh  Date of visit:  06/03/2013 DOB:  [birth year]    Age:  58 yrs. Medical record number:  [id number]     Account number:  [id number] Primary Care Provider: Merrilee Seashore ____________________________ CURRENT DIAGNOSES  1. CAD,Native  2. Dyspnea  3. Hyperlipidemia  4. Obesity(BMI30-40)  5. Hypertension,Essential (Benign)  6. Surgery-Aortocoronary Bypass Grafting  7. Dyspnea ____________________________ ALLERGIES  IVP Dye, Iodine Containing, Hives and/or rash ____________________________ MEDICATIONS  1. Vitamin C 1,000 mg tablet, 1 p.o. daily  2. multivitamin tablet, 1 p.o. daily  3. Aleve 220 mg tablet, PRN  4. allopurinol 100 mg tablet, 1 p.o. daily  5. fluticasone 50 mcg/actuation nasal spray,suspension, qd  6. nitroglycerin 0.4 mg sublingual tablet, Take as directed  7. amiodarone 200 mg tablet, 1 p.o. daily  8. atorvastatin 10 mg tablet, 1 p.o. daily  9. metoprolol tartrate 25 mg tablet, BID  10. allopurinol 300 mg tablet, 1 p.o. daily  11. aspirin 325 mg tablet, 1 p.o. daily  12. oxycodone 5 mg tablet, PRN  13. zolpidem 10 mg tablet, hs prn ____________________________ HISTORY OF PRESENT ILLNESS He had been doing well since he was here but has developed some sweats during the day and at night. He had a temperature develop Friday night and Saturday and called the PA on call for did not wish to come to the emergency room. He felt somewhat better Sunday and went into work yesterday and says that he felt fairly good today. He has not had recurrent temperature. He has continued to feel weak in his legs. He wonders whether he is pushing things too much. He is anxious to get back to traveling.  Exam:  Blood pressure is still somewhat low. His incision is clean and dry. There is minimal tenderness in his sternum but no erythema. Lungs clear, no S3  TREADMILL  The patient exercised on the  standard [place] protocol a total of 4:30 minutes into [place] stage 2 achieving a workload of 6 METS. The test was stopped due to dyspnea and fatigue. The patient had no chest pain suggestive of angina. Heart rate rose to 96 which was 59% of predicted maximum. Blood pressure response was blunted  12-lead EKG shows nonspecific ST changes at rest. With exercise he did not reach his target heart rate but had no worsening of the ST segment changes.  IMPRESSIONS:  1. Clinically and EKG negative for ischemia 2. Reduced exercise capacity with blunted pressure response  RECOMMENDATIONS:  Concerned about his clinical course and need be sure that he does not have underlying infection I would like him to have lab work as noted below and a chest x-ray and he will see Dr. Servando Snare this afternoon.  In the meantime he is to have an echocardiogram and I also asked him not to plan on traveling until we get all this resolved.  In addition I asked him to discontinue amiodarone and to reduce his metoprolol to 25 mg in the morning and eventually we will taper him off of this. I would like him to have an echocardiogram to be sure he does not have a pericardial effusion.  PAST HISTORY  Past Medical Illnesses:  hypertension, non alcololic fatty liver disease, gout, kidney stones, obesity;  Cardiovascular Illnesses:  CAD;  Surgical Procedures:  kidney stone removal;  Cardiology Procedures-Invasive:  cardiac cath (left) April 2015, CABG with LIMA to LAD, SVG to diag,  SVG to OM1-OM2, SVG to AM and distal circumflex 04/18/13 Dr. Servando Snare;  Cardiology Procedures-Noninvasive:  no previous non-invasive procedures;  Cardiac Cath Results:  normal Left main, occluded Diag 1, 99% stenosis proximal CFX, occluded distal LAD, AM 90% proximal;  LVEF not documented,    TODAYS ORDERS  1. Comprehensive Metabolic Panel: Today  2. Complete Blood Count: Today  3. BNP: Today  4. 2D, color flow, doppler: First Available  5. Chest X-ray  PA/Lat: today  6. Urinalysis: Today                        Cardiology Physician:  Kerry Hough MD [facility]

## 2013-06-04 ENCOUNTER — Encounter (HOSPITAL_COMMUNITY): Payer: Self-pay

## 2013-06-04 ENCOUNTER — Encounter (HOSPITAL_COMMUNITY)
Admission: RE | Admit: 2013-06-04 | Discharge: 2013-06-04 | Disposition: A | Discharge status: Home / Self Care | Payer: BC Managed Care – PPO | Source: Ambulatory Visit | Attending: Cardiology | Admitting: Cardiology

## 2013-06-04 DIAGNOSIS — Z5189 Encounter for other specified aftercare: Secondary | ICD-10-CM | POA: Diagnosis not present

## 2013-06-04 DIAGNOSIS — I2510 Atherosclerotic heart disease of native coronary artery without angina pectoris: Secondary | ICD-10-CM | POA: Diagnosis present

## 2013-06-04 DIAGNOSIS — I10 Essential (primary) hypertension: Secondary | ICD-10-CM | POA: Diagnosis not present

## 2013-06-04 DIAGNOSIS — I4891 Unspecified atrial fibrillation: Secondary | ICD-10-CM | POA: Diagnosis not present

## 2013-06-04 DIAGNOSIS — E785 Hyperlipidemia, unspecified: Secondary | ICD-10-CM | POA: Diagnosis not present

## 2013-06-04 NOTE — Progress Notes (Signed)
Pt started cardiac rehab today.  Pt tolerated light exercise without difficulty.  VSS, telemetry-NSR.  Asymptomatic. Pt c/o fatigue and decreased stamina at home. Pt states this is somewhat improved with recent medication changes however he has not resumed stair climbing at home and is unsure if he is able to do so more than once daily. Pt states he is ready to return to work with frequent business travel, however stamina is a concern.  In fact, pt short term goal is to get back to business travel in 2-3 weeks and build strength/stamina for work activities. Pt long term goal is prevent progression of heart disease.  Pt offered emotional support and reassurance about work related and health related anxiety. Pt encouraged active, consistent  participation in cardiac rehab and making appropriate  lifestyle modifications will help him  reach his short term and long term goals.  Pt instructed to discuss clearance for work related travel with Dr. Wynonia Lawman and Dr. Servando Snare.  PHQ-0.  Pt did not return home work packet. Pt will bring with him to next scheduled rehab.  Pt oriented to exercise equipment and routine.  Understanding verbalized.

## 2013-06-06 ENCOUNTER — Encounter (HOSPITAL_COMMUNITY)
Admission: RE | Admit: 2013-06-06 | Discharge: 2013-06-06 | Disposition: A | Discharge status: Home / Self Care | Payer: BC Managed Care – PPO | Source: Ambulatory Visit | Attending: Cardiology | Admitting: Cardiology

## 2013-06-06 DIAGNOSIS — Z5189 Encounter for other specified aftercare: Secondary | ICD-10-CM | POA: Diagnosis not present

## 2013-06-09 ENCOUNTER — Encounter (HOSPITAL_COMMUNITY)
Admission: RE | Admit: 2013-06-09 | Discharge: 2013-06-09 | Disposition: A | Discharge status: Home / Self Care | Payer: BC Managed Care – PPO | Source: Ambulatory Visit | Attending: Cardiology | Admitting: Cardiology

## 2013-06-09 DIAGNOSIS — Z951 Presence of aortocoronary bypass graft: Secondary | ICD-10-CM | POA: Insufficient documentation

## 2013-06-09 DIAGNOSIS — I10 Essential (primary) hypertension: Secondary | ICD-10-CM | POA: Insufficient documentation

## 2013-06-09 DIAGNOSIS — Z5189 Encounter for other specified aftercare: Secondary | ICD-10-CM | POA: Insufficient documentation

## 2013-06-09 DIAGNOSIS — I2510 Atherosclerotic heart disease of native coronary artery without angina pectoris: Secondary | ICD-10-CM | POA: Insufficient documentation

## 2013-06-11 ENCOUNTER — Encounter (HOSPITAL_COMMUNITY)
Admission: RE | Admit: 2013-06-11 | Discharge: 2013-06-11 | Disposition: A | Discharge status: Home / Self Care | Payer: BC Managed Care – PPO | Source: Ambulatory Visit | Attending: Cardiology | Admitting: Cardiology

## 2013-06-13 ENCOUNTER — Encounter (HOSPITAL_COMMUNITY)
Admission: RE | Admit: 2013-06-13 | Discharge: 2013-06-13 | Disposition: A | Discharge status: Home / Self Care | Payer: BC Managed Care – PPO | Source: Ambulatory Visit | Attending: Cardiology | Admitting: Cardiology

## 2013-06-16 ENCOUNTER — Encounter (HOSPITAL_COMMUNITY)
Admission: RE | Admit: 2013-06-16 | Discharge: 2013-06-16 | Disposition: A | Discharge status: Home / Self Care | Payer: BC Managed Care – PPO | Source: Ambulatory Visit | Attending: Cardiology | Admitting: Cardiology

## 2013-06-18 ENCOUNTER — Encounter (HOSPITAL_COMMUNITY)
Admission: RE | Admit: 2013-06-18 | Discharge: 2013-06-18 | Disposition: A | Discharge status: Home / Self Care | Payer: BC Managed Care – PPO | Source: Ambulatory Visit | Attending: Cardiology | Admitting: Cardiology

## 2013-06-20 ENCOUNTER — Encounter (HOSPITAL_COMMUNITY)
Admission: RE | Admit: 2013-06-20 | Discharge: 2013-06-20 | Disposition: A | Discharge status: Home / Self Care | Payer: BC Managed Care – PPO | Source: Ambulatory Visit | Attending: Cardiology | Admitting: Cardiology

## 2013-06-20 NOTE — Progress Notes (Signed)
I have reviewed home exercise with Se[place]. The patient was advised to walk 2-4 days per week outside of CRP II for 30-45 minutes continuously.  Pt will also complete one additional day of hand weights outside of CRP II.  Progression of exercise prescription was discussed.  Reviewed THR, pulse, RPE, sign and symptoms, NTG use and when to call 911 or MD.  Pt voiced understanding. [place], MS, ACSM RCEP 06/20/2013 2:51 PM

## 2013-06-23 ENCOUNTER — Encounter (HOSPITAL_COMMUNITY)
Admission: RE | Admit: 2013-06-23 | Discharge: 2013-06-23 | Disposition: A | Discharge status: Home / Self Care | Payer: BC Managed Care – PPO | Source: Ambulatory Visit | Attending: Cardiology | Admitting: Cardiology

## 2013-06-25 ENCOUNTER — Encounter (HOSPITAL_COMMUNITY)
Admission: RE | Admit: 2013-06-25 | Discharge: 2013-06-25 | Disposition: A | Discharge status: Home / Self Care | Payer: BC Managed Care – PPO | Source: Ambulatory Visit | Attending: Cardiology | Admitting: Cardiology

## 2013-06-25 ENCOUNTER — Other Ambulatory Visit: Payer: Self-pay | Admitting: Cardiothoracic Surgery

## 2013-06-25 DIAGNOSIS — I4891 Unspecified atrial fibrillation: Secondary | ICD-10-CM

## 2013-06-26 ENCOUNTER — Ambulatory Visit
Admission: RE | Admit: 2013-06-26 | Discharge: 2013-06-26 | Disposition: A | Discharge status: Home / Self Care | Payer: BC Managed Care – PPO | Source: Ambulatory Visit | Attending: Cardiothoracic Surgery | Admitting: Cardiothoracic Surgery

## 2013-06-26 ENCOUNTER — Ambulatory Visit (INDEPENDENT_AMBULATORY_CARE_PROVIDER_SITE_OTHER): Payer: Self-pay | Admitting: Cardiothoracic Surgery

## 2013-06-26 ENCOUNTER — Encounter: Payer: Self-pay | Admitting: Cardiothoracic Surgery

## 2013-06-26 VITALS — BP 111/75 | HR 77 | Resp 16 | Ht 72.0 in | Wt 274.0 lb

## 2013-06-26 DIAGNOSIS — Z951 Presence of aortocoronary bypass graft: Secondary | ICD-10-CM

## 2013-06-26 DIAGNOSIS — I4891 Unspecified atrial fibrillation: Secondary | ICD-10-CM

## 2013-06-26 DIAGNOSIS — I2510 Atherosclerotic heart disease of native coronary artery without angina pectoris: Secondary | ICD-10-CM

## 2013-06-26 NOTE — Progress Notes (Signed)
[address]       [place] [address]             [phone]      Ran Dedman [place] Medical Record #[id number] Date of Birth: 22-Feb-1955  Referring: Jacolyn Reedy, MD Primary Care: Merrilee Seashore, MD  Chief Complaint:   POST OP FOLLOW UP 04/18/2013  OPERATIVE REPORT  PREOPERATIVE DIAGNOSIS: Unstable angina.  POSTOPERATIVE DIAGNOSIS: Unstable angina.  SURGICAL PROCEDURE: Coronary artery bypass grafting x6 with the left  internal mammary to the distal left anterior descending coronary artery,  reverse saphenous vein graft to the diagonal coronary artery, reverse  saphenous vein graft to the first and second obtuse marginal, reverse  saphenous vein graft sequentially to the acute marginal and distal  circumflex, with right leg endo vein harvesting.   History of Present Illness:     Patient returns to the office today  in followup after coronary bypass grafting approximately 8 weeks ago. The patient feels much better now, his "rubbery legs" have resolved. He is now completed almost a month of cardiac rehabilitation and feels well. He's interested in returning to work traveling. He's had no recurrent angina or evidence of congestive heart failure. His beta blocker has stopped but he continues on aspirin and statin.  Past Medical History  Diagnosis Date  . Essential hypertension   . Gout   . Obesity (BMI 30-39.9)   . CAD (coronary artery disease)   . Kidney stones      History  Smoking status  . Former Smoker -- 25 years  Smokeless tobacco  . Never Used    History  Alcohol Use No     Allergies  Allergen Reactions  . Contrast Media [Iodinated Diagnostic Agents] Hives, Shortness Of Breath and Nausea And Vomiting  . Contrast Media [Iodinated Diagnostic Agents]     Current Outpatient Prescriptions  Medication Sig Dispense Refill  . allopurinol (ZYLOPRIM) 100 MG tablet Take 100 mg by mouth daily. Take 100 mg along with 300 mg to  equal 400 mg total daily dose.      . allopurinol (ZYLOPRIM) 300 MG tablet Take 300 mg by mouth daily. Take 300 mg along with 100 mg to equal 400 mg      . Ascorbic Acid (VITAMIN C) 1000 MG tablet Take 2,000 mg by mouth daily.      Marland Kitchen aspirin EC 325 MG EC tablet Take 1 tablet (325 mg total) by mouth daily.      Marland Kitchen atorvastatin (LIPITOR) 10 MG tablet Take 1 tablet (10 mg total) by mouth daily at 6 PM.  30 tablet  1  . Multiple Vitamins-Minerals (CENTRUM SILVER PO) Take 1 tablet by mouth daily.      . Polyvinyl Alcohol-Povidone (REFRESH OP) Place 1 drop into both eyes 2 (two) times daily as needed (dry eyes).       No current facility-administered medications for this visit.       Physical Exam: BP 111/75  Pulse 77  Resp 16  Ht 6' (1.829 m)  Wt 274 lb (124.286 kg)  BMI 37.15 kg/m2  SpO2 98%  General appearance: alert, cooperative, appears stated age and no distress Neurologic: intact Heart: regular rate and rhythm, S1, S2 normal, no murmur, click, rub or gallop Lungs: clear to auscultation bilaterally Abdomen: soft, non-tender; bowel sounds normal; no masses,  no organomegaly Extremities: extremities normal, atraumatic, no cyanosis or edema and Homans sign is negative, no sign of DVT Wound: Sternum  is stable and well-healed without erythema or evidence of infection, there is no popping or clicking   Diagnostic Studies & Laboratory data:     Recent Radiology Findings:   Dg Chest 2 View  06/26/2013   CLINICAL DATA:  Atrial fibrillation  EXAM: CHEST  2 VIEW  COMPARISON:  06/03/2013  FINDINGS: Cardiomediastinal silhouette is stable. Status post CABG. Old left rib fractures again noted. No acute infiltrate or pleural effusion. No pulmonary edema. Bony thorax is stable.  IMPRESSION: No active cardiopulmonary disease.   Electronically Signed   By: Lahoma Crocker M.D.   On: 06/26/2013 09:16      Recent Lab Findings: Lab Results  Component Value Date   WBC 8.4 05/07/2013   HGB 11.5*  05/07/2013   HCT 34.4* 05/07/2013   PLT 396 05/07/2013   GLUCOSE 101* 05/08/2013   ALT 17 05/07/2013   AST 15 05/07/2013   NA 142 05/08/2013   K 4.3 05/08/2013   CL 105 05/08/2013   CREATININE 1.31 05/08/2013   BUN 23 05/08/2013   CO2 23 05/08/2013   TSH 0.997 04/20/2013   INR 1.27 04/18/2013   HGBA1C 6.1* 04/17/2013   TREADMILL  The patient exercised on the standard [place] protocol a total of 4:30 minutes into [place] stage 2 achieving a workload of 6 METS. The test was stopped due to dyspnea and fatigue. The patient had no chest pain suggestive of angina. Heart rate rose to 96 which was 59% of predicted maximum. Blood pressure response was blunted  12-lead EKG shows nonspecific ST changes at rest. With exercise he did not reach his target heart rate but had no worsening of the ST segment changes.    Assessment / Plan:   Stable following coronary artery bypass grafting now approximately 8 weeks postop making good progress. I reviewed with him the need to continue with weight reduction, regular exercise, continuing on a statin and aspirin. I've not made him a return appointment to see [place] followed by cardiology but would be glad to see him as needed.  [name] MD      [address] [place] [address] Office [phone]   Beeper [phone]  06/26/2013 9:51 AM

## 2013-06-27 ENCOUNTER — Encounter (HOSPITAL_COMMUNITY)
Admission: RE | Admit: 2013-06-27 | Discharge: 2013-06-27 | Disposition: A | Discharge status: Home / Self Care | Payer: BC Managed Care – PPO | Source: Ambulatory Visit | Attending: Cardiology | Admitting: Cardiology

## 2013-06-30 ENCOUNTER — Encounter (HOSPITAL_COMMUNITY)
Admission: RE | Admit: 2013-06-30 | Discharge: 2013-06-30 | Disposition: A | Discharge status: Home / Self Care | Payer: BC Managed Care – PPO | Source: Ambulatory Visit | Attending: Cardiology | Admitting: Cardiology

## 2013-06-30 NOTE — Progress Notes (Signed)
[name] 58 y.o. male Nutrition Note Spoke with pt.  Nutrition Plan reviewed with pt. Pt has not returned his Nutrition Survey. Pt states "it's in my car, I'll bring it in on Wednesday." Per discussion, pt has lost 40 lb over the past 2 years and has worked with an RD at his MD's office. Pt wants to continue to lose wt. Pt has been trying to lose wt by following the dietary recommendations previously received. According to pt's A1c, pt is pre-diabetic. Pre-diabetes discussed. Pt encouraged to talk about pre-diabetes further with his PCP. Pt expressed understanding of the information reviewed. Pt aware of nutrition education classes offered and plans on attending nutrition classes with his wife in July.  Nutrition Diagnosis   Food-and nutrition-related knowledge deficit related to lack of exposure to information as related to diagnosis of: ? CVD ? Pre-DM (A1c 6.1) ?   Obesity related to excessive energy intake as evidenced by a BMI of 38.4  Nutrition RX/ Estimated Daily Nutrition Needs for: wt loss  2000-2500 Kcal, 55-70 gm fat, 15-17 gm sat fat, 13-17 gm trans-fat, <1500 mg sodium  Nutrition Intervention   Pt's individual nutrition plan reviewed with pt.   Benefits of adopting Therapeutic Lifestyle Changes discussed when Medficts reviewed.   Pt to attend the Portion Distortion class   Pt to attend the  ? Nutrition I class                     ? Nutrition II class    Pt given handouts for: ? Nutrition I class ? Nutrition II class    Continue client-centered nutrition education by RD, as part of interdisciplinary care. Goal(s)   Pt to identify and limit food sources of saturated fat, trans fat, and cholesterol   Pt to identify food quantities necessary to achieve: ? wt loss to a goal wt of 258-278 lb (117.4-125.6 kg) at graduation from cardiac rehab.  Monitor and Evaluate progress toward nutrition goal with team.  Derek Mound, M.Ed, RD, LDN, CDE 06/30/2013 2:21 PM

## 2013-07-02 ENCOUNTER — Encounter (HOSPITAL_COMMUNITY)
Admission: RE | Admit: 2013-07-02 | Discharge: 2013-07-02 | Disposition: A | Discharge status: Home / Self Care | Payer: BC Managed Care – PPO | Source: Ambulatory Visit | Attending: Cardiology | Admitting: Cardiology

## 2013-07-04 ENCOUNTER — Encounter (HOSPITAL_COMMUNITY)
Admission: RE | Admit: 2013-07-04 | Discharge: 2013-07-04 | Disposition: A | Discharge status: Home / Self Care | Payer: BC Managed Care – PPO | Source: Ambulatory Visit | Attending: Cardiology | Admitting: Cardiology

## 2013-07-07 ENCOUNTER — Encounter (HOSPITAL_COMMUNITY)
Admission: RE | Admit: 2013-07-07 | Discharge: 2013-07-07 | Disposition: A | Discharge status: Home / Self Care | Payer: BC Managed Care – PPO | Source: Ambulatory Visit | Attending: Cardiology | Admitting: Cardiology

## 2013-07-07 NOTE — Progress Notes (Signed)
[name] 58 y.o. male Nutrition Note Spoke with pt.  Nutrition Survey reviewed with pt. Pt is following Step 2 of the Therapeutic Lifestyle Changes diet. Pt reports his A1c was re-drawn last week and "it was 5.6 so I'm no longer pre-diabetic." Pt encouraged to continue to monitor his A1c with his PCP. Pt expressed understanding of the information reviewed. Pt aware of nutrition education classes offered and plans on attending nutrition classes with his wife.  Nutrition Diagnosis   Food-and nutrition-related knowledge deficit related to lack of exposure to information as related to diagnosis of: ? CVD ? Pre-DM (A1c 6.1)    Obesity related to excessive energy intake as evidenced by a BMI of 38.4  Nutrition RX/ Estimated Daily Nutrition Needs for: wt loss  2000-2500 Kcal, 55-70 gm fat, 15-17 gm sat fat, 13-17 gm trans-fat, <1500 mg sodium  Nutrition Intervention   Benefits of adopting Therapeutic Lifestyle Changes discussed when Medficts reviewed.   Pt to attend the Portion Distortion class   Pt to attend the  ? Nutrition I class                     ? Nutrition II class   Continue client-centered nutrition education by RD, as part of interdisciplinary care. Goal(s)   Pt to identify and limit food sources of saturated fat, trans fat, and cholesterol   Pt to identify food quantities necessary to achieve: ? wt loss to a goal wt of 258-278 lb (117.4-125.6 kg) at graduation from cardiac rehab.  Monitor and Evaluate progress toward nutrition goal with team. Nutrition Risk change to Moderate Derek Mound, M.Ed, RD, LDN, CDE 07/07/2013 2:27 PM

## 2013-07-09 ENCOUNTER — Encounter (HOSPITAL_COMMUNITY)
Admission: RE | Admit: 2013-07-09 | Discharge: 2013-07-09 | Disposition: A | Discharge status: Home / Self Care | Payer: BC Managed Care – PPO | Source: Ambulatory Visit | Attending: Cardiology | Admitting: Cardiology

## 2013-07-09 DIAGNOSIS — I2510 Atherosclerotic heart disease of native coronary artery without angina pectoris: Secondary | ICD-10-CM | POA: Insufficient documentation

## 2013-07-09 DIAGNOSIS — I10 Essential (primary) hypertension: Secondary | ICD-10-CM | POA: Insufficient documentation

## 2013-07-09 DIAGNOSIS — Z951 Presence of aortocoronary bypass graft: Secondary | ICD-10-CM | POA: Insufficient documentation

## 2013-07-09 DIAGNOSIS — Z5189 Encounter for other specified aftercare: Secondary | ICD-10-CM | POA: Insufficient documentation

## 2013-07-11 ENCOUNTER — Encounter (HOSPITAL_COMMUNITY): Payer: BC Managed Care – PPO

## 2013-07-14 ENCOUNTER — Encounter (HOSPITAL_COMMUNITY)
Admission: RE | Admit: 2013-07-14 | Discharge: 2013-07-14 | Disposition: A | Discharge status: Home / Self Care | Payer: BC Managed Care – PPO | Source: Ambulatory Visit | Attending: Cardiology | Admitting: Cardiology

## 2013-07-16 ENCOUNTER — Encounter (HOSPITAL_COMMUNITY)
Admission: RE | Admit: 2013-07-16 | Discharge: 2013-07-16 | Disposition: A | Discharge status: Home / Self Care | Payer: BC Managed Care – PPO | Source: Ambulatory Visit | Attending: Cardiology | Admitting: Cardiology

## 2013-07-18 ENCOUNTER — Encounter (HOSPITAL_COMMUNITY)
Admission: RE | Admit: 2013-07-18 | Discharge: 2013-07-18 | Disposition: A | Discharge status: Home / Self Care | Payer: BC Managed Care – PPO | Source: Ambulatory Visit | Attending: Cardiology | Admitting: Cardiology

## 2013-07-21 ENCOUNTER — Encounter (HOSPITAL_COMMUNITY)
Admission: RE | Admit: 2013-07-21 | Discharge: 2013-07-21 | Disposition: A | Discharge status: Home / Self Care | Payer: BC Managed Care – PPO | Source: Ambulatory Visit | Attending: Cardiology | Admitting: Cardiology

## 2013-07-23 ENCOUNTER — Encounter (HOSPITAL_COMMUNITY)
Admission: RE | Admit: 2013-07-23 | Discharge: 2013-07-23 | Disposition: A | Discharge status: Home / Self Care | Payer: BC Managed Care – PPO | Source: Ambulatory Visit | Attending: Cardiology | Admitting: Cardiology

## 2013-07-25 ENCOUNTER — Encounter (HOSPITAL_COMMUNITY)
Admission: RE | Admit: 2013-07-25 | Discharge: 2013-07-25 | Disposition: A | Discharge status: Home / Self Care | Payer: BC Managed Care – PPO | Source: Ambulatory Visit | Attending: Cardiology | Admitting: Cardiology

## 2013-07-28 ENCOUNTER — Encounter (HOSPITAL_COMMUNITY)
Admission: RE | Admit: 2013-07-28 | Discharge: 2013-07-28 | Disposition: A | Discharge status: Home / Self Care | Payer: BC Managed Care – PPO | Source: Ambulatory Visit | Attending: Cardiology | Admitting: Cardiology

## 2013-07-30 ENCOUNTER — Encounter (HOSPITAL_COMMUNITY)
Admission: RE | Admit: 2013-07-30 | Discharge: 2013-07-30 | Disposition: A | Discharge status: Home / Self Care | Payer: BC Managed Care – PPO | Source: Ambulatory Visit | Attending: Cardiology | Admitting: Cardiology

## 2013-07-30 NOTE — Progress Notes (Signed)
Pt hypotensive at cardiac rehab post exercise.  C/o slight dizziness. Pt BP-86/60.  Pt given gatorade.  BP- 94/72.  Orthostatic BP:  95/67 sitting, 94/69 standing. Recheck:  120/80.  Pt asymptomatic. Pt encouraged to drink gatorade with exercise consistently, especially in hot humid weather. Pt admits he may be restricting his sodium intake too much.  Will continue to monitor.  Report faxed to Dr. Wynonia Lawman. Spoke to Vincent, Dr. Thurman Coyer nurse to make aware.

## 2013-08-01 ENCOUNTER — Encounter (HOSPITAL_COMMUNITY)
Admission: RE | Admit: 2013-08-01 | Discharge: 2013-08-01 | Disposition: A | Discharge status: Home / Self Care | Payer: BC Managed Care – PPO | Source: Ambulatory Visit | Attending: Cardiology | Admitting: Cardiology

## 2013-08-04 ENCOUNTER — Encounter (HOSPITAL_COMMUNITY)
Admission: RE | Admit: 2013-08-04 | Discharge: 2013-08-04 | Disposition: A | Discharge status: Home / Self Care | Payer: BC Managed Care – PPO | Source: Ambulatory Visit | Attending: Cardiology | Admitting: Cardiology

## 2013-08-06 ENCOUNTER — Encounter (HOSPITAL_COMMUNITY)
Admission: RE | Admit: 2013-08-06 | Discharge: 2013-08-06 | Disposition: A | Discharge status: Home / Self Care | Payer: BC Managed Care – PPO | Source: Ambulatory Visit | Attending: Cardiology | Admitting: Cardiology

## 2013-08-08 ENCOUNTER — Encounter (HOSPITAL_COMMUNITY)
Admission: RE | Admit: 2013-08-08 | Discharge: 2013-08-08 | Disposition: A | Discharge status: Home / Self Care | Payer: BC Managed Care – PPO | Source: Ambulatory Visit | Attending: Cardiology | Admitting: Cardiology

## 2013-08-11 ENCOUNTER — Encounter (HOSPITAL_COMMUNITY)
Admission: RE | Admit: 2013-08-11 | Discharge: 2013-08-11 | Disposition: A | Discharge status: Home / Self Care | Payer: BC Managed Care – PPO | Source: Ambulatory Visit | Attending: Cardiology | Admitting: Cardiology

## 2013-08-11 DIAGNOSIS — Z951 Presence of aortocoronary bypass graft: Secondary | ICD-10-CM | POA: Diagnosis not present

## 2013-08-11 DIAGNOSIS — I2510 Atherosclerotic heart disease of native coronary artery without angina pectoris: Secondary | ICD-10-CM | POA: Insufficient documentation

## 2013-08-11 DIAGNOSIS — Z5189 Encounter for other specified aftercare: Secondary | ICD-10-CM | POA: Insufficient documentation

## 2013-08-11 DIAGNOSIS — I10 Essential (primary) hypertension: Secondary | ICD-10-CM | POA: Diagnosis not present

## 2013-08-13 ENCOUNTER — Encounter (HOSPITAL_COMMUNITY)
Admission: RE | Admit: 2013-08-13 | Discharge: 2013-08-13 | Disposition: A | Discharge status: Home / Self Care | Payer: BC Managed Care – PPO | Source: Ambulatory Visit | Attending: Cardiology | Admitting: Cardiology

## 2013-08-13 DIAGNOSIS — Z5189 Encounter for other specified aftercare: Secondary | ICD-10-CM | POA: Diagnosis not present

## 2013-08-15 ENCOUNTER — Encounter (HOSPITAL_COMMUNITY): Payer: Self-pay

## 2013-08-15 ENCOUNTER — Encounter (HOSPITAL_COMMUNITY)
Admission: RE | Admit: 2013-08-15 | Discharge: 2013-08-15 | Disposition: A | Discharge status: Home / Self Care | Payer: BC Managed Care – PPO | Source: Ambulatory Visit | Attending: Cardiology | Admitting: Cardiology

## 2013-08-15 DIAGNOSIS — Z5189 Encounter for other specified aftercare: Secondary | ICD-10-CM | POA: Diagnosis not present

## 2013-08-15 NOTE — Progress Notes (Signed)
Pt graduated from cardiac rehab program today.  Medication list reconciled.  PHQ9 score-0.  Pt quality of life scores have significantly increased. Pt verbalizes more confidence in his health and his future health after participating in cardiac rehab exercises.  Marland Kitchen Pt plans to continue exercise at gym although pt admits it will challenging to maintain consistency without the structure and routine of cardiac rehab program.  Pt given information about cardiac maintenance program.   Pt feels he has adeqautely met his cardiac rehab goals of increasing strength and endurance.  Pt has made significant lifestyle changes and should be commended for his success

## 2013-08-18 ENCOUNTER — Encounter (HOSPITAL_COMMUNITY): Payer: BC Managed Care – PPO

## 2013-08-20 ENCOUNTER — Encounter (HOSPITAL_COMMUNITY): Payer: BC Managed Care – PPO

## 2013-08-22 ENCOUNTER — Encounter (HOSPITAL_COMMUNITY): Payer: BC Managed Care – PPO

## 2013-08-25 ENCOUNTER — Encounter (HOSPITAL_COMMUNITY): Payer: BC Managed Care – PPO

## 2013-08-27 ENCOUNTER — Encounter (HOSPITAL_COMMUNITY): Payer: BC Managed Care – PPO

## 2013-08-29 ENCOUNTER — Encounter (HOSPITAL_COMMUNITY): Payer: BC Managed Care – PPO

## 2013-09-01 ENCOUNTER — Encounter (HOSPITAL_COMMUNITY): Payer: BC Managed Care – PPO

## 2013-09-03 ENCOUNTER — Encounter (HOSPITAL_COMMUNITY): Payer: BC Managed Care – PPO

## 2013-09-05 ENCOUNTER — Encounter (HOSPITAL_COMMUNITY): Payer: BC Managed Care – PPO

## 2013-09-08 ENCOUNTER — Encounter (HOSPITAL_COMMUNITY): Payer: BC Managed Care – PPO

## 2013-12-18 ENCOUNTER — Encounter (HOSPITAL_COMMUNITY): Payer: Self-pay | Admitting: Cardiology

## 2014-01-22 ENCOUNTER — Encounter (HOSPITAL_COMMUNITY): Payer: Self-pay | Admitting: Cardiology

## 2014-04-16 ENCOUNTER — Other Ambulatory Visit: Payer: Self-pay | Admitting: Urology

## 2014-04-16 DIAGNOSIS — N200 Calculus of kidney: Secondary | ICD-10-CM

## 2014-04-28 ENCOUNTER — Ambulatory Visit (HOSPITAL_COMMUNITY): Payer: Self-pay

## 2014-05-12 NOTE — Progress Notes (Signed)
Chest xray 06-26-13 epic Echo 06-04-13 dr Wynonia Lawman on chart 04-27-14 dr Devonne Doughty note/cardiac clearance note for 05-28-14 surgery on chart ekg 04-17-14 dr Wynonia Lawman on chart

## 2014-05-13 ENCOUNTER — Encounter (HOSPITAL_COMMUNITY): Payer: Self-pay

## 2014-05-13 ENCOUNTER — Encounter (HOSPITAL_COMMUNITY)
Admission: RE | Admit: 2014-05-13 | Discharge: 2014-05-13 | Disposition: A | Discharge status: Home / Self Care | Payer: BLUE CROSS/BLUE SHIELD | Source: Ambulatory Visit | Attending: Urology | Admitting: Urology

## 2014-05-13 DIAGNOSIS — N211 Calculus in urethra: Secondary | ICD-10-CM | POA: Insufficient documentation

## 2014-05-13 DIAGNOSIS — Z01812 Encounter for preprocedural laboratory examination: Secondary | ICD-10-CM | POA: Insufficient documentation

## 2014-05-13 HISTORY — DX: Presence of aortocoronary bypass graft: Z95.1

## 2014-05-13 HISTORY — DX: Sleep apnea, unspecified: G47.30

## 2014-05-13 LAB — CBC
HEMATOCRIT: 47.3 % (ref 39.0–52.0)
Hemoglobin: 15.8 g/dL (ref 13.0–17.0)
MCH: 32.6 pg (ref 26.0–34.0)
MCHC: 33.4 g/dL (ref 30.0–36.0)
MCV: 97.5 fL (ref 78.0–100.0)
Platelets: 207 10*3/uL (ref 150–400)
RBC: 4.85 MIL/uL (ref 4.22–5.81)
RDW: 13 % (ref 11.5–15.5)
WBC: 7 10*3/uL (ref 4.0–10.5)

## 2014-05-13 LAB — BASIC METABOLIC PANEL
Anion gap: 10 (ref 5–15)
BUN: 26 mg/dL — AB (ref 6–20)
CO2: 26 mmol/L (ref 22–32)
Calcium: 9.4 mg/dL (ref 8.9–10.3)
Chloride: 106 mmol/L (ref 101–111)
Creatinine, Ser: 1.07 mg/dL (ref 0.61–1.24)
GFR calc Af Amer: >60 mL/min (ref >60)
GLUCOSE: 106 mg/dL — AB (ref 70–99)
Potassium: 5 mmol/L (ref 3.5–5.1)
Sodium: 142 mmol/L (ref 135–145)

## 2014-05-13 NOTE — Patient Instructions (Signed)
YOUR PROCEDURE IS SCHEDULED ON : 05/28/14  REPORT TO [facility] MAIN ENTRANCE FOLLOW SIGNS TO RADIOLOGY AT : 8:00 AM  CALL THIS NUMBER IF YOU HAVE PROBLEMS THE MORNING OF SURGERY [phone]  REMEMBER:  DO NOT EAT FOOD OR DRINK LIQUIDS AFTER MIDNIGHT  TAKE THESE MEDICINES THE MORNING OF SURGERY: ALLOPURINOL / Stigler ON YOUR BODY INCLUDING HAIR PINS AND PIERCING'S. DO NOT WEAR JEWELRY, MAKEUP, LOTIONS, POWDERS OR PERFUMES. DO NOT WEAR NAIL POLISH. DO NOT SHAVE 48 HRS PRIOR TO SURGERY. MEN MAY SHAVE FACE AND NECK.  DO NOT [place]. [facility] IS NOT RESPONSIBLE FOR VALUABLES.  CONTACTS, DENTURES OR PARTIALS MAY NOT BE WORN TO SURGERY. LEAVE SUITCASE IN CAR. CAN BE BROUGHT TO ROOM AFTER SURGERY.  PATIENTS DISCHARGED THE DAY OF SURGERY WILL NOT BE ALLOWED TO DRIVE HOME.  PLEASE READ OVER THE FOLLOWING INSTRUCTION SHEETS _________________________________________________________________________________                                          [facility] - PREPARING FOR SURGERY  Before surgery, you can play an important role.  Because skin is not sterile, your skin needs to be as free of germs as possible.  You can reduce the number of germs on your skin by washing with CHG (chlorahexidine gluconate) soap before surgery.  CHG is an antiseptic cleaner which kills germs and bonds with the skin to continue killing germs even after washing. Please DO NOT use if you have an allergy to CHG or antibacterial soaps.  If your skin becomes reddened/irritated stop using the CHG and inform your nurse when you arrive at Short Stay. Do not shave (including legs and underarms) for at least 48 hours prior to the first CHG shower.  You may shave your face. Please follow these instructions carefully:   1.  Shower with CHG Soap the night before surgery and the  morning of Surgery.   2.  If you choose to wash your hair, wash your hair first  as usual with your  normal  Shampoo.   3.  After you shampoo, rinse your hair and body thoroughly to remove the  shampoo.                                         4.  Use CHG as you would any other liquid soap.  You can apply chg directly  to the skin and wash . Gently wash with scrungie or clean wascloth    5.  Apply the CHG Soap to your body ONLY FROM THE NECK DOWN.   Do not use on open                           Wound or open sores. Avoid contact with eyes, ears mouth and genitals (private parts).                        Genitals (private parts) with your normal soap.              6.  Wash thoroughly, paying special attention to the area where your surgery  will be performed.   7.  Thoroughly rinse your body with warm water from the neck down.   8.  DO NOT shower/wash with your normal soap after using and rinsing off  the CHG Soap .                9.  Pat yourself dry with a clean towel.             10.  Wear clean night clothes to bed after shower             11.  Place clean sheets on your bed the night of your first shower and do not  sleep with pets.  Day of Surgery : Do not apply any lotions/deodorants the morning of surgery.  Please wear clean clothes to the hospital/surgery center.  FAILURE TO FOLLOW THESE INSTRUCTIONS MAY RESULT IN THE CANCELLATION OF YOUR SURGERY    PATIENT SIGNATURE_________________________________  ______________________________________________________________________

## 2014-05-26 ENCOUNTER — Other Ambulatory Visit: Payer: Self-pay | Admitting: Radiology

## 2014-05-27 ENCOUNTER — Other Ambulatory Visit: Payer: Self-pay | Admitting: Radiology

## 2014-05-27 NOTE — H&P (Signed)
Urology History and Physical Exam  CC: Kidney stone  HPI: 59 year old male presents for percutaneous management of a 15 mm right renal pelvic stone. HE has had intermittent gross hematuria. Recent KUB revealed a 15.5 mm right renal pelvic stone. Prior CT from 2013 was significant for a 6-7 mm right calyceal stone. We went over treatment options in the office following diagnosis-ESL vs. URS vs. PCNL. Risks/complications/stone free rates were discussed. He has chosen right PCNL.  PMH: Past Medical History  Diagnosis Date  . Essential hypertension   . Gout   . Obesity (BMI 30-39.9)   . CAD (coronary artery disease)   . Kidney stones   . S/P CABG x 6   . Sleep apnea     unable to use c-pap    PSH: Past Surgical History  Procedure Laterality Date  . Kidney stone surgery    . Intraoperative transesophageal echocardiogram N/A 04/18/2013    Procedure: INTRAOPERATIVE TRANSESOPHAGEAL ECHOCARDIOGRAM;  Surgeon: [name], MD;  Location: [place];  Service: Open Heart Surgery;  Laterality: N/A;  . Left and right heart catheterization with coronary angiogram N/A 04/16/2013    Procedure: LEFT AND RIGHT HEART CATHETERIZATION WITH CORONARY ANGIOGRAM;  Surgeon: Jacolyn Reedy, MD;  Location: [facility] CATH LAB;  Service: Cardiovascular;  Laterality: N/A;  . Coronary artery bypass graft N/A 04/18/2013    Procedure: CORONARY ARTERY BYPASS GRAFTING (CABG) TIMES SIX USING LEFT INTERNAL MAMMARY ARTERY AND RIGHT SAPHENOUS LEG VEIN HARVESTED ENDOSCOPICALLY;  Surgeon: [name], MD;  Location: [place];  Service: Open Heart Surgery;  Laterality: N/A;    Allergies: Allergies  Allergen Reactions  . Contrast Media [Iodinated Diagnostic Agents] Hives, Shortness Of Breath and Nausea And Vomiting  . Contrast Media [Iodinated Diagnostic Agents]     Medications: No prescriptions prior to admission     Social History: History   Social History  . Marital Status: Married    Spouse Name: N/A  . Number  of Children: N/A  . Years of Education: N/A   Occupational History  . Not on file.   Social History Main Topics  . Smoking status: Former Smoker -- 25 years    Quit date: 05/13/1994  . Smokeless tobacco: Never Used  . Alcohol Use: Yes     Comment: occasional  . Drug Use: No  . Sexual Activity: Yes   Other Topics Concern  . Not on file   Social History Narrative   ** Merged History Encounter **        Family History: No family history on file.  Review of Systems:  Genitourinary: feelings of urinary urgency, dysuria, nocturia, incontinence, difficulty starting the urinary stream, weak urinary stream, urinary stream starts and stops, incomplete emptying of bladder, hematuria and initiating urination requires straining.  Gastrointestinal: nausea and diarrhea.  Constitutional: feeling tired (fatigue).  Musculoskeletal: back pain.                 Physical Exam: @VITALS2 @ General: No acute distress.  Awake. Head:  Normocephalic.  Atraumatic. ENT:  EOMI.  Mucous membranes moist Neck:  Supple.  No lymphadenopathy. CV:  S1 present. S2 present. Regular rate. Pulmonary: Equal effort bilaterally.  Clear to auscultation bilaterally. Abdomen: Soft.  Nontender to palpation. Skin:  Normal turgor.  No visible rash. Extremity: No gross deformity of bilateral upper extremities.  No gross deformity of  lower extremities. Neurologic: Alert. Appropriate mood.   Studies:  No results for input(s): HGB, WBC, PLT in the last 72 hours.  No results for input(s): NA, K, CL, CO2, BUN, CREATININE, CALCIUM, GFRNONAA, GFRAA in the last 72 hours.  Invalid input(s): MAGNESIUM   No results for input(s): INR, APTT in the last 72 hours.  Invalid input(s): PT   Invalid input(s): ABG    Assessment: 16 mm right renal calculus  Plan: Right PCNL

## 2014-05-28 ENCOUNTER — Ambulatory Visit (HOSPITAL_COMMUNITY)
Admission: RE | Admit: 2014-05-28 | Discharge: 2014-05-28 | Disposition: A | Discharge status: Home / Self Care | Payer: BLUE CROSS/BLUE SHIELD | Source: Ambulatory Visit | Attending: Urology | Admitting: Urology

## 2014-05-28 ENCOUNTER — Ambulatory Visit (HOSPITAL_COMMUNITY): Payer: BLUE CROSS/BLUE SHIELD | Admitting: Anesthesiology

## 2014-05-28 ENCOUNTER — Inpatient Hospital Stay (HOSPITAL_COMMUNITY)
Admission: RE | Admit: 2014-05-28 | Discharge: 2014-05-30 | DRG: 660 | Disposition: A | Discharge status: Home / Self Care | Payer: BLUE CROSS/BLUE SHIELD | Source: Ambulatory Visit | Attending: Urology | Admitting: Urology

## 2014-05-28 ENCOUNTER — Encounter (HOSPITAL_COMMUNITY)
Admission: RE | Disposition: A | Discharge status: Home / Self Care | Payer: Self-pay | Source: Ambulatory Visit | Attending: Urology

## 2014-05-28 ENCOUNTER — Ambulatory Visit (HOSPITAL_COMMUNITY): Payer: BLUE CROSS/BLUE SHIELD

## 2014-05-28 ENCOUNTER — Encounter (HOSPITAL_COMMUNITY): Payer: Self-pay

## 2014-05-28 ENCOUNTER — Encounter (HOSPITAL_COMMUNITY): Payer: Self-pay | Admitting: Anesthesiology

## 2014-05-28 DIAGNOSIS — N200 Calculus of kidney: Secondary | ICD-10-CM

## 2014-05-28 DIAGNOSIS — I10 Essential (primary) hypertension: Secondary | ICD-10-CM | POA: Diagnosis present

## 2014-05-28 DIAGNOSIS — G4730 Sleep apnea, unspecified: Secondary | ICD-10-CM | POA: Diagnosis present

## 2014-05-28 DIAGNOSIS — N132 Hydronephrosis with renal and ureteral calculous obstruction: Principal | ICD-10-CM | POA: Diagnosis present

## 2014-05-28 DIAGNOSIS — Z951 Presence of aortocoronary bypass graft: Secondary | ICD-10-CM

## 2014-05-28 DIAGNOSIS — E669 Obesity, unspecified: Secondary | ICD-10-CM | POA: Diagnosis present

## 2014-05-28 DIAGNOSIS — Z91041 Radiographic dye allergy status: Secondary | ICD-10-CM

## 2014-05-28 DIAGNOSIS — Z01812 Encounter for preprocedural laboratory examination: Secondary | ICD-10-CM

## 2014-05-28 DIAGNOSIS — M109 Gout, unspecified: Secondary | ICD-10-CM | POA: Diagnosis present

## 2014-05-28 DIAGNOSIS — Z87891 Personal history of nicotine dependence: Secondary | ICD-10-CM

## 2014-05-28 DIAGNOSIS — Z6835 Body mass index (BMI) 35.0-35.9, adult: Secondary | ICD-10-CM

## 2014-05-28 DIAGNOSIS — I2510 Atherosclerotic heart disease of native coronary artery without angina pectoris: Secondary | ICD-10-CM | POA: Diagnosis present

## 2014-05-28 HISTORY — PX: NEPHROLITHOTOMY: SHX5134

## 2014-05-28 LAB — CBC WITH DIFFERENTIAL/PLATELET
BASOS ABS: 0 10*3/uL (ref 0.0–0.1)
Basophils Relative: 0 % (ref 0–1)
EOS ABS: 0 10*3/uL (ref 0.0–0.7)
EOS PCT: 0 % (ref 0–5)
HCT: 47.8 % (ref 39.0–52.0)
Hemoglobin: 16.5 g/dL (ref 13.0–17.0)
Lymphocytes Relative: 5 % — ABNORMAL LOW (ref 12–46)
Lymphs Abs: 0.6 10*3/uL — ABNORMAL LOW (ref 0.7–4.0)
MCH: 32.8 pg (ref 26.0–34.0)
MCHC: 34.5 g/dL (ref 30.0–36.0)
MCV: 95 fL (ref 78.0–100.0)
Monocytes Absolute: 0.2 10*3/uL (ref 0.1–1.0)
Monocytes Relative: 1 % — ABNORMAL LOW (ref 3–12)
Neutro Abs: 10.9 10*3/uL — ABNORMAL HIGH (ref 1.7–7.7)
Neutrophils Relative %: 94 % — ABNORMAL HIGH (ref 43–77)
PLATELETS: 230 10*3/uL (ref 150–400)
RBC: 5.03 MIL/uL (ref 4.22–5.81)
RDW: 12.8 % (ref 11.5–15.5)
WBC: 11.7 10*3/uL — AB (ref 4.0–10.5)

## 2014-05-28 LAB — BASIC METABOLIC PANEL
Anion gap: 12 (ref 5–15)
BUN: 28 mg/dL — AB (ref 6–20)
CALCIUM: 9.5 mg/dL (ref 8.9–10.3)
CO2: 22 mmol/L (ref 22–32)
Chloride: 103 mmol/L (ref 101–111)
Creatinine, Ser: 1.17 mg/dL (ref 0.61–1.24)
GFR calc Af Amer: >60 mL/min (ref >60)
GLUCOSE: 144 mg/dL — AB (ref 65–99)
Potassium: 4.4 mmol/L (ref 3.5–5.1)
Sodium: 137 mmol/L (ref 135–145)

## 2014-05-28 LAB — PROTIME-INR
INR: 0.97 (ref 0.00–1.49)
Prothrombin Time: 13 seconds (ref 11.6–15.2)

## 2014-05-28 LAB — APTT: aPTT: 32 seconds (ref 24–37)

## 2014-05-28 SURGERY — NEPHROLITHOTOMY PERCUTANEOUS
Anesthesia: General | Laterality: Right

## 2014-05-28 MED ORDER — ALLOPURINOL 100 MG PO TABS
100.0000 mg | ORAL_TABLET | Freq: Every morning | ORAL | Status: DC
  Administered 2014-05-29 – 2014-05-30 (×2): 100 mg via ORAL
  Filled 2014-05-28 (×2): qty 1

## 2014-05-28 MED ORDER — ATORVASTATIN CALCIUM 10 MG PO TABS
10.0000 mg | ORAL_TABLET | Freq: Every day | ORAL | Status: DC
  Administered 2014-05-29: 10 mg via ORAL
  Filled 2014-05-28 (×2): qty 1

## 2014-05-28 MED ORDER — PROPOFOL 10 MG/ML IV BOLUS
INTRAVENOUS | Status: DC | PRN
  Administered 2014-05-28: 200 mg via INTRAVENOUS

## 2014-05-28 MED ORDER — GLYCOPYRROLATE 0.2 MG/ML IJ SOLN
INTRAMUSCULAR | Status: AC
  Filled 2014-05-28: qty 3

## 2014-05-28 MED ORDER — SODIUM CHLORIDE 0.45 % IV SOLN
INTRAVENOUS | Status: DC
  Administered 2014-05-28 – 2014-05-30 (×3): via INTRAVENOUS

## 2014-05-28 MED ORDER — IOHEXOL 300 MG/ML  SOLN
INTRAMUSCULAR | Status: DC | PRN
Start: 2014-05-28 — End: 2014-05-28
  Administered 2014-05-28: 25 mL

## 2014-05-28 MED ORDER — ZOLPIDEM TARTRATE 5 MG PO TABS
5.0000 mg | ORAL_TABLET | Freq: Every evening | ORAL | Status: DC | PRN
  Administered 2014-05-28: 5 mg via ORAL
  Filled 2014-05-28: qty 1

## 2014-05-28 MED ORDER — ONDANSETRON HCL 4 MG/2ML IJ SOLN
INTRAMUSCULAR | Status: AC
  Filled 2014-05-28: qty 2

## 2014-05-28 MED ORDER — MIDAZOLAM HCL 2 MG/2ML IJ SOLN
INTRAMUSCULAR | Status: AC
  Filled 2014-05-28: qty 2

## 2014-05-28 MED ORDER — LIDOCAINE HCL (CARDIAC) 20 MG/ML IV SOLN
INTRAVENOUS | Status: DC | PRN
  Administered 2014-05-28: 60 mg via INTRAVENOUS

## 2014-05-28 MED ORDER — FENTANYL CITRATE (PF) 100 MCG/2ML IJ SOLN
INTRAMUSCULAR | Status: AC
  Filled 2014-05-28: qty 4

## 2014-05-28 MED ORDER — CIPROFLOXACIN IN D5W 400 MG/200ML IV SOLN
INTRAVENOUS | Status: AC
  Filled 2014-05-28: qty 200

## 2014-05-28 MED ORDER — CIPROFLOXACIN IN D5W 400 MG/200ML IV SOLN
400.0000 mg | INTRAVENOUS | Status: AC
  Administered 2014-05-28: 400 mg via INTRAVENOUS

## 2014-05-28 MED ORDER — LIDOCAINE HCL (CARDIAC) 20 MG/ML IV SOLN
INTRAVENOUS | Status: AC
  Filled 2014-05-28: qty 5

## 2014-05-28 MED ORDER — LACTATED RINGERS IV SOLN: INTRAVENOUS | Status: DC

## 2014-05-28 MED ORDER — ROCURONIUM BROMIDE 100 MG/10ML IV SOLN
INTRAVENOUS | Status: AC
  Filled 2014-05-28: qty 1

## 2014-05-28 MED ORDER — HYDRALAZINE HCL 20 MG/ML IJ SOLN
INTRAMUSCULAR | Status: AC
  Filled 2014-05-28: qty 1

## 2014-05-28 MED ORDER — TAMSULOSIN HCL 0.4 MG PO CAPS
0.4000 mg | ORAL_CAPSULE | Freq: Every morning | ORAL | Status: DC
  Administered 2014-05-29 – 2014-05-30 (×2): 0.4 mg via ORAL
  Filled 2014-05-28 (×2): qty 1

## 2014-05-28 MED ORDER — PROPOFOL 10 MG/ML IV BOLUS
INTRAVENOUS | Status: AC
  Filled 2014-05-28: qty 20

## 2014-05-28 MED ORDER — CEPHALEXIN 500 MG PO CAPS
500.0000 mg | ORAL_CAPSULE | Freq: Three times a day (TID) | ORAL | Status: DC
  Administered 2014-05-28 – 2014-05-30 (×6): 500 mg via ORAL
  Filled 2014-05-28 (×7): qty 1

## 2014-05-28 MED ORDER — ROCURONIUM BROMIDE 100 MG/10ML IV SOLN
INTRAVENOUS | Status: DC | PRN
  Administered 2014-05-28: 5 mg via INTRAVENOUS
  Administered 2014-05-28: 30 mg via INTRAVENOUS
  Administered 2014-05-28: 5 mg via INTRAVENOUS

## 2014-05-28 MED ORDER — OXYCODONE HCL 5 MG PO TABS
5.0000 mg | ORAL_TABLET | ORAL | Status: DC | PRN
  Administered 2014-05-28 – 2014-05-29 (×5): 5 mg via ORAL
  Filled 2014-05-28 (×5): qty 1

## 2014-05-28 MED ORDER — MIDAZOLAM HCL 2 MG/2ML IJ SOLN
INTRAMUSCULAR | Status: AC | PRN
  Administered 2014-05-28: 1 mg via INTRAVENOUS
  Administered 2014-05-28: 0.5 mg via INTRAVENOUS
  Administered 2014-05-28: 1 mg via INTRAVENOUS
  Administered 2014-05-28: 0.5 mg via INTRAVENOUS
  Administered 2014-05-28 (×2): 1 mg via INTRAVENOUS
  Administered 2014-05-28: 0.5 mg via INTRAVENOUS
  Administered 2014-05-28: 1 mg via INTRAVENOUS
  Administered 2014-05-28: 0.5 mg via INTRAVENOUS

## 2014-05-28 MED ORDER — SUCCINYLCHOLINE CHLORIDE 20 MG/ML IJ SOLN
INTRAMUSCULAR | Status: DC | PRN
Start: 2014-05-28 — End: 2014-05-28
  Administered 2014-05-28: 140 mg via INTRAVENOUS

## 2014-05-28 MED ORDER — FENTANYL CITRATE (PF) 250 MCG/5ML IJ SOLN
INTRAMUSCULAR | Status: AC
  Filled 2014-05-28: qty 5

## 2014-05-28 MED ORDER — FENTANYL CITRATE (PF) 100 MCG/2ML IJ SOLN
INTRAMUSCULAR | Status: DC | PRN
  Administered 2014-05-28: 100 ug via INTRAVENOUS
  Administered 2014-05-28 (×3): 50 ug via INTRAVENOUS

## 2014-05-28 MED ORDER — HYDROMORPHONE HCL 1 MG/ML IJ SOLN
INTRAMUSCULAR | Status: AC
  Filled 2014-05-28: qty 1

## 2014-05-28 MED ORDER — ONDANSETRON HCL 4 MG/2ML IJ SOLN
4.0000 mg | INTRAMUSCULAR | Status: DC | PRN
  Administered 2014-05-28 – 2014-05-29 (×3): 4 mg via INTRAVENOUS
  Filled 2014-05-28 (×4): qty 2

## 2014-05-28 MED ORDER — CIPROFLOXACIN IN D5W 400 MG/200ML IV SOLN: 400.0000 mg | INTRAVENOUS | Status: DC

## 2014-05-28 MED ORDER — MIDAZOLAM HCL 2 MG/2ML IJ SOLN
INTRAMUSCULAR | Status: AC
  Filled 2014-05-28: qty 6

## 2014-05-28 MED ORDER — CARBOXYMETHYLCELLULOSE SODIUM 1 % OP SOLN: 1.0000 [drp] | Freq: Two times a day (BID) | OPHTHALMIC | Status: DC

## 2014-05-28 MED ORDER — HYDROMORPHONE HCL 1 MG/ML IJ SOLN
0.2500 mg | INTRAMUSCULAR | Status: DC | PRN
  Administered 2014-05-28 (×2): 0.5 mg via INTRAVENOUS

## 2014-05-28 MED ORDER — NEOSTIGMINE METHYLSULFATE 10 MG/10ML IV SOLN
INTRAVENOUS | Status: DC | PRN
  Administered 2014-05-28: 3 mg via INTRAVENOUS

## 2014-05-28 MED ORDER — ACETAMINOPHEN 325 MG PO TABS: 650.0000 mg | ORAL_TABLET | ORAL | Status: DC | PRN

## 2014-05-28 MED ORDER — GLYCOPYRROLATE 0.2 MG/ML IJ SOLN
INTRAMUSCULAR | Status: DC | PRN
  Administered 2014-05-28: 0.4 mg via INTRAVENOUS

## 2014-05-28 MED ORDER — MIDAZOLAM HCL 5 MG/5ML IJ SOLN
INTRAMUSCULAR | Status: DC | PRN
  Administered 2014-05-28: 1 mg via INTRAVENOUS

## 2014-05-28 MED ORDER — SODIUM CHLORIDE 0.9 % IR SOLN
Status: DC | PRN
  Administered 2014-05-28: 6000 mL

## 2014-05-28 MED ORDER — OXYBUTYNIN CHLORIDE 5 MG PO TABS: 5.0000 mg | ORAL_TABLET | Freq: Three times a day (TID) | ORAL | Status: DC | PRN

## 2014-05-28 MED ORDER — ALLOPURINOL 300 MG PO TABS
300.0000 mg | ORAL_TABLET | Freq: Every day | ORAL | Status: DC
  Administered 2014-05-29 – 2014-05-30 (×2): 300 mg via ORAL
  Filled 2014-05-28 (×2): qty 1

## 2014-05-28 MED ORDER — LIDOCAINE HCL 1 % IJ SOLN
INTRAMUSCULAR | Status: AC
  Filled 2014-05-28: qty 20

## 2014-05-28 MED ORDER — IOHEXOL 300 MG/ML  SOLN
50.0000 mL | Freq: Once | INTRAMUSCULAR | Status: AC | PRN
  Administered 2014-05-28: 20 mL

## 2014-05-28 MED ORDER — FENTANYL CITRATE (PF) 100 MCG/2ML IJ SOLN
INTRAMUSCULAR | Status: AC | PRN
  Administered 2014-05-28 (×2): 50 ug via INTRAVENOUS

## 2014-05-28 MED ORDER — NEOSTIGMINE METHYLSULFATE 10 MG/10ML IV SOLN
INTRAVENOUS | Status: AC
  Filled 2014-05-28: qty 1

## 2014-05-28 MED ORDER — MIDAZOLAM HCL 2 MG/2ML IJ SOLN
INTRAMUSCULAR | Status: AC
  Filled 2014-05-28: qty 4

## 2014-05-28 MED ORDER — CEPHALEXIN 250 MG PO CAPS: 250.0000 mg | ORAL_CAPSULE | Freq: Two times a day (BID) | ORAL | Status: DC

## 2014-05-28 MED ORDER — HYDROMORPHONE HCL 1 MG/ML IJ SOLN
0.5000 mg | INTRAMUSCULAR | Status: DC | PRN
  Administered 2014-05-28 – 2014-05-29 (×3): 1 mg via INTRAVENOUS
  Filled 2014-05-28 (×3): qty 1

## 2014-05-28 MED ORDER — DOCUSATE SODIUM 100 MG PO CAPS
100.0000 mg | ORAL_CAPSULE | Freq: Two times a day (BID) | ORAL | Status: DC
  Administered 2014-05-28 – 2014-05-30 (×4): 100 mg via ORAL
  Filled 2014-05-28 (×3): qty 1

## 2014-05-28 MED ORDER — DIPHENHYDRAMINE HCL (SLEEP) 25 MG PO TABS: 50.0000 mg | ORAL_TABLET | Freq: Once | ORAL | Status: DC

## 2014-05-28 MED ORDER — HYDRALAZINE HCL 20 MG/ML IJ SOLN
INTRAMUSCULAR | Status: DC | PRN
  Administered 2014-05-28 (×2): 5 mg via INTRAVENOUS

## 2014-05-28 MED ORDER — SODIUM CHLORIDE 0.9 % IV SOLN: INTRAVENOUS | Status: DC

## 2014-05-28 MED ORDER — LACTATED RINGERS IV SOLN
INTRAVENOUS | Status: DC
  Administered 2014-05-28 (×2): via INTRAVENOUS

## 2014-05-28 MED ORDER — POLYVINYL ALCOHOL 1.4 % OP SOLN
1.0000 [drp] | Freq: Two times a day (BID) | OPHTHALMIC | Status: DC
  Filled 2014-05-28: qty 15

## 2014-05-28 MED ORDER — CETYLPYRIDINIUM CHLORIDE 0.05 % MT LIQD
7.0000 mL | Freq: Two times a day (BID) | OROMUCOSAL | Status: DC
  Administered 2014-05-29 (×2): 7 mL via OROMUCOSAL

## 2014-05-28 SURGICAL SUPPLY — 48 items
APL SKNCLS STERI-STRIP NONHPOA (GAUZE/BANDAGES/DRESSINGS) ×2
BAG URINE DRAINAGE (UROLOGICAL SUPPLIES) ×2 IMPLANT
BASKET ZERO TIP NITINOL 2.4FR (BASKET) ×2 IMPLANT
BENZOIN TINCTURE PRP APPL 2/3 (GAUZE/BANDAGES/DRESSINGS) ×4 IMPLANT
BLADE SURG 15 STRL LF DISP TIS (BLADE) ×1 IMPLANT
BLADE SURG 15 STRL SS (BLADE) ×2
BSKT STON RTRVL ZERO TP 2.4FR (BASKET) ×1
CARTRIDGE STONEBREAK CO2 KIDNE (ELECTROSURGICAL) ×1 IMPLANT
CATCHER STONE W/TUBE ADAPTER (UROLOGICAL SUPPLIES) ×1 IMPLANT
CATH FOLEY 2W COUNCIL 20FR 5CC (CATHETERS) IMPLANT
CATH MULTI PURPOSE 16FR DRAIN (STENTS) ×1 IMPLANT
CATH ROBINSON RED A/P 20FR (CATHETERS) IMPLANT
CATH X-FORCE N30 NEPHROSTOMY (TUBING) ×2 IMPLANT
COVER SURGICAL LIGHT HANDLE (MISCELLANEOUS) ×2 IMPLANT
DRAPE C-ARM 42X120 X-RAY (DRAPES) ×2 IMPLANT
DRAPE CAMERA CLOSED 9X96 (DRAPES) ×1 IMPLANT
DRAPE LINGEMAN PERC (DRAPES) ×2 IMPLANT
DRAPE SURG IRRIG POUCH 19X23 (DRAPES) ×2 IMPLANT
DRSG PAD ABDOMINAL 8X10 ST (GAUZE/BANDAGES/DRESSINGS) ×4 IMPLANT
DRSG TEGADERM 8X12 (GAUZE/BANDAGES/DRESSINGS) ×4 IMPLANT
FIBER LASER FLEXIVA 1000 (UROLOGICAL SUPPLIES) IMPLANT
FIBER LASER FLEXIVA 200 (UROLOGICAL SUPPLIES) IMPLANT
FIBER LASER FLEXIVA 365 (UROLOGICAL SUPPLIES) IMPLANT
FIBER LASER FLEXIVA 550 (UROLOGICAL SUPPLIES) IMPLANT
FIBER LASER TRAC TIP (UROLOGICAL SUPPLIES) IMPLANT
GAUZE SPONGE 4X4 12PLY STRL (GAUZE/BANDAGES/DRESSINGS) ×2 IMPLANT
GLOVE BIOGEL M 8.0 STRL (GLOVE) ×2 IMPLANT
GOWN STRL REUS W/TWL XL LVL3 (GOWN DISPOSABLE) ×4 IMPLANT
KIT BASIN OR (CUSTOM PROCEDURE TRAY) ×2 IMPLANT
MANIFOLD NEPTUNE II (INSTRUMENTS) ×2 IMPLANT
NS IRRIG 1000ML POUR BTL (IV SOLUTION) ×2 IMPLANT
PACK BASIC VI WITH GOWN DISP (CUSTOM PROCEDURE TRAY) ×2 IMPLANT
PROBE KIDNEY STONEBRKR 2.0X425 (ELECTROSURGICAL) ×2 IMPLANT
PROBE LITHOCLAST ULTRA 3.8X403 (UROLOGICAL SUPPLIES) IMPLANT
PROBE PNEUMATIC 1.0MMX570MM (UROLOGICAL SUPPLIES) ×1 IMPLANT
SET IRRIG Y TYPE TUR BLADDER L (SET/KITS/TRAYS/PACK) ×2 IMPLANT
SET WARMING FLUID IRRIGATION (MISCELLANEOUS) ×1 IMPLANT
SPONGE LAP 4X18 X RAY DECT (DISPOSABLE) ×2 IMPLANT
STONE CATCHER W/TUBE ADAPTER (UROLOGICAL SUPPLIES) IMPLANT
SUT SILK 2 0 30  PSL (SUTURE) ×1
SUT SILK 2 0 30 PSL (SUTURE) ×1 IMPLANT
SYR 20CC LL (SYRINGE) ×4 IMPLANT
SYRINGE 10CC LL (SYRINGE) ×2 IMPLANT
TRAY FOLEY BAG SILVER LF 14FR (CATHETERS) ×1 IMPLANT
TRAY FOLEY W/METER SILVER 14FR (SET/KITS/TRAYS/PACK) ×2 IMPLANT
TUBE CONNECTING VINYL 14FR 30C (MISCELLANEOUS) ×1 IMPLANT
TUBING CONNECTING 10 (TUBING) ×4 IMPLANT
WATER STERILE IRR 1500ML POUR (IV SOLUTION) ×2 IMPLANT

## 2014-05-28 NOTE — Anesthesia Postprocedure Evaluation (Signed)
  Anesthesia Post-op Note  Patient: Gary [name]  Procedure(s) Performed: Procedure(s) (LRB): NEPHROLITHOTOMY PERCUTANEOUS (Right)  Patient Location: PACU  Anesthesia Type: General  Level of Consciousness: awake and alert   Airway and Oxygen Therapy: Patient Spontanous Breathing  Post-op Pain: mild  Post-op Assessment: Post-op Vital signs reviewed, Patient's Cardiovascular Status Stable, Respiratory Function Stable, Patent Airway and No signs of Nausea or vomiting  Last Vitals:  Filed Vitals:   05/28/14 1515  BP: 141/86  Pulse: 69  Temp: 36.6 C  Resp: 14    Post-op Vital Signs: stable   Complications: No apparent anesthesia complications

## 2014-05-28 NOTE — Anesthesia Preprocedure Evaluation (Addendum)
Anesthesia Evaluation  Patient identified by MRN, date of birth, ID band Patient awake    Reviewed: Allergy & Precautions, H&P , NPO status , Patient's Chart, lab work & pertinent test results  History of Anesthesia Complications Negative for: history of anesthetic complications  Airway Mallampati: III  TM Distance: >3 FB Neck ROM: Full    Dental no notable dental hx. (+) Teeth Intact, Dental Advisory Given   Pulmonary sleep apnea , former smoker,  Quit 20 years ago breath sounds clear to auscultation  Pulmonary exam normal       Cardiovascular hypertension, + angina with exertion + CAD and + CABG Normal cardiovascular exam+ dysrhythmias Atrial Fibrillation Rhythm:Regular Rate:Normal     Neuro/Psych negative neurological ROS  negative psych ROS   GI/Hepatic negative GI ROS, Neg liver ROS,   Endo/Other  negative endocrine ROS  Renal/GU negative Renal ROS  negative genitourinary   Musculoskeletal   Abdominal Normal abdominal exam  (+) + obese,   Peds  Hematology negative hematology ROS (+)   Anesthesia Other Findings   Reproductive/Obstetrics negative OB ROS                            Anesthesia Physical Anesthesia Plan  ASA: III  Anesthesia Plan: General   Post-op Pain Management:    Induction: Intravenous  Airway Management Planned: Oral ETT  Additional Equipment:   Intra-op Plan:   Post-operative Plan: Extubation in OR  Informed Consent: I have reviewed the patients History and Physical, chart, labs and discussed the procedure including the risks, benefits and alternatives for the proposed anesthesia with the patient or authorized representative who has indicated his/her understanding and acceptance.   Dental Advisory Given  Plan Discussed with: CRNA and Surgeon  Anesthesia Plan Comments:         Anesthesia Quick Evaluation

## 2014-05-28 NOTE — H&P (Signed)
Referring Physician(s): Dahlstedt,Stephen  History of Present Illness: Gary [name] is a 59 y.o. male with right renal calculus who has been seen by Dr. Diona Fanti and scheduled today for image guided PCN with PCNL to follow in OR. The patient has had a H&P performed within the last 30 days, all history, medications, and exam have been reviewed. The patient denies any interval changes since the H&P. The patient denies any active CP/SOB/palpitations. He denies any gross hematuria or right flank pain. He denies any N/V, denies any fever or chills. He denies any difficulty with sedation. He has an allergy to iodinated contrast and has tolerated pre-medication previously without any complications after a heart catheterization. He has taken his pre-medication last night and this morning as instructed.    Past Medical History  Diagnosis Date  . Essential hypertension   . Gout   . Obesity (BMI 30-39.9)   . CAD (coronary artery disease)   . Kidney stones   . S/P CABG x 6   . Sleep apnea     unable to use c-pap    Past Surgical History  Procedure Laterality Date  . Kidney stone surgery    . Intraoperative transesophageal echocardiogram N/A 04/18/2013    Procedure: INTRAOPERATIVE TRANSESOPHAGEAL ECHOCARDIOGRAM;  Surgeon: [name], MD;  Location: [place];  Service: Open Heart Surgery;  Laterality: N/A;  . Left and right heart catheterization with coronary angiogram N/A 04/16/2013    Procedure: LEFT AND RIGHT HEART CATHETERIZATION WITH CORONARY ANGIOGRAM;  Surgeon: Jacolyn Reedy, MD;  Location: [facility] CATH LAB;  Service: Cardiovascular;  Laterality: N/A;  . Coronary artery bypass graft N/A 04/18/2013    Procedure: CORONARY ARTERY BYPASS GRAFTING (CABG) TIMES SIX USING LEFT INTERNAL MAMMARY ARTERY AND RIGHT SAPHENOUS LEG VEIN HARVESTED ENDOSCOPICALLY;  Surgeon: [name], MD;  Location: [place];  Service: Open Heart Surgery;  Laterality: N/A;    Allergies: Contrast media and Contrast  media- premedicated with prednisone and benadryl   Medications: Prior to Admission medications   Medication Sig Start Date End Date Taking? Authorizing Provider  allopurinol (ZYLOPRIM) 100 MG tablet Take 100 mg by mouth every morning. Take 100 mg along with 300 mg to equal 400 mg total daily dose.   Yes Historical Provider, MD  atorvastatin (LIPITOR) 10 MG tablet Take 1 tablet (10 mg total) by mouth daily at 6 PM. 04/24/13  Yes Wayne E Gold, PA-C  diphenhydrAMINE (SOMINEX) 25 MG tablet Take 50 mg by mouth at bedtime as needed for sleep.   Yes Historical Provider, MD  predniSONE (DELTASONE) 50 MG tablet Take 50 mg by mouth daily with breakfast.   Yes Historical Provider, MD  tamsulosin (FLOMAX) 0.4 MG CAPS capsule Take 0.4 mg by mouth every morning.   Yes Historical Provider, MD  allopurinol (ZYLOPRIM) 300 MG tablet Take 300 mg by mouth daily. Take 300 mg along with 100 mg to equal 400 mg    Historical Provider, MD  Ascorbic Acid (VITAMIN C) 1000 MG tablet Take 2,000 mg by mouth every morning.     Historical Provider, MD  aspirin EC 325 MG EC tablet Take 1 tablet (325 mg total) by mouth daily. Patient not taking: Reported on 05/12/2014 04/24/13   John Giovanni, PA-C  aspirin EC 81 MG tablet Take 81 mg by mouth every morning.    Historical Provider, MD  Multiple Vitamins-Minerals (CENTRUM SILVER PO) Take 1 tablet by mouth every morning.     Historical Provider, MD  naproxen  sodium (ANAPROX) 220 MG tablet Take 440 mg by mouth 2 (two) times daily as needed (pain.).    Historical Provider, MD  Polyvinyl Alcohol-Povidone (REFRESH OP) Place 1 drop into both eyes 2 (two) times daily as needed (dry eyes).    Historical Provider, MD     History reviewed. No pertinent family history.  History   Social History  . Marital Status: Married    Spouse Name: N/A  . Number of Children: N/A  . Years of Education: N/A   Social History Main Topics  . Smoking status: Former Smoker -- 25 years    Quit date:  05/13/1994  . Smokeless tobacco: Never Used  . Alcohol Use: Yes     Comment: occasional  . Drug Use: No  . Sexual Activity: Yes   Other Topics Concern  . None   Social History Narrative   ** Merged History Encounter **       Review of Systems: A 12 point ROS discussed and pertinent positives are indicated in the HPI above.  All other systems are negative.  Review of Systems  Vital Signs: BP 105/78 mmHg  Pulse 89  Temp(Src) 97.4 F (36.3 C) (Oral)  Resp 16  SpO2 97%  Physical Exam  Constitutional: He is oriented to person, place, and time. No distress.  HENT:  Head: Normocephalic and atraumatic.  Neck: No tracheal deviation present.  Cardiovascular: Normal rate and regular rhythm.  Exam reveals no gallop and no friction rub.   No murmur heard. Pulmonary/Chest: Effort normal and breath sounds normal. No respiratory distress. He has no wheezes. He has no rales.  Abdominal: Soft. Bowel sounds are normal. He exhibits no distension. There is no tenderness.  Neurological: He is alert and oriented to person, place, and time.  Skin: Skin is warm and dry. He is not diaphoretic.  Psychiatric: He has a normal mood and affect. His behavior is normal. Thought content normal.   Mallampati Score:  MD Evaluation Airway: WNL Heart: WNL Abdomen: WNL Chest/ Lungs: WNL ASA  Classification: 3 Mallampati/Airway Score: Two  Imaging: No results found.  Labs:  CBC:  Recent Labs  05/13/14 0915 05/28/14 0845  WBC 7.0 11.7*  HGB 15.8 16.5  HCT 47.3 47.8  PLT 207 230    COAGS: No results for input(s): INR, APTT in the last 8760 hours.  BMP:  Recent Labs  05/13/14 0930 05/28/14 0845  NA 142 137  K 5.0 4.4  CL 106 103  CO2 26 22  GLUCOSE 106* 144*  BUN 26* 28*  CALCIUM 9.4 9.5  CREATININE 1.07 1.17  GFRNONAA >60 >60  GFRAA >60 >60   Assessment and Plan: 16 mm Right renal calculi Seen by Dr. Diona Fanti Scheduled today for image guided right PCN with PCNL to  follow in OR The patient has been NPO, no blood thinners taken, labs and vitals have been reviewed. Iodindated contrast allergy-has taken 13 hr prep Risks and Benefits discussed with the patient including, but not limited to infection, bleeding, significant bleeding causing loss or decrease in renal function or damage to adjacent structures.  All of the patient's questions were answered, patient is agreeable to proceed. Consent signed and in chart. CAD s/p CABG    Signed: Hedy Jacob 05/28/2014, 9:55 AM

## 2014-05-28 NOTE — Anesthesia Procedure Notes (Signed)
Procedure Name: Intubation Date/Time: 05/28/2014 12:38 PM Performed by: Sherian Maroon A Pre-anesthesia Checklist: Patient identified, Emergency Drugs available, Suction available, Patient being monitored and Timeout performed Patient Re-evaluated:Patient Re-evaluated prior to inductionOxygen Delivery Method: Circle system utilized Preoxygenation: Pre-oxygenation with 100% oxygen Intubation Type: IV induction Ventilation: Two handed mask ventilation required Laryngoscope Size: Mac and 4 Grade View: Grade II Number of attempts: 1 Airway Equipment and Method: Stylet Secured at: 24 cm Tube secured with: Tape Dental Injury: Teeth and Oropharynx as per pre-operative assessment

## 2014-05-28 NOTE — Interval H&P Note (Signed)
Pt examined, procedure discussed with pt this afternoon.

## 2014-05-28 NOTE — Op Note (Signed)
Preoperative diagnosis: 15 mm right renal pelvic stone  Postoperative diagnosis: Same   Procedure: Percutaneous nephrolithotomy of right renal pelvic stone, 15 mm. Right antegrade nephrostogram with interpretive fluoroscopy    Surgeon: Lillette Boxer. Blythe Veach, M.D.   Anesthesia: Gen.   Complications: None  Specimen(s): Stone fragments, given to the patient's family  Drain(s): 52 French Cook pigtail catheter in right renal pelvis  Indications: 59 year old male with intermittent gross hematuria as well as pain from a right renal pelvic stone, 15 and half millimeters in size. This was recently diagnosed with a KUB, as he did have a history of prior urolithiasis. Because of the location of the stone as well as the size and is associated symptoms, it was recommended that he undergo surgical management. We discussed lithotripsy using shockwave, ureteroscopy with holmium laser and extraction of fragments as well as percutaneous nephrolithotomy. Risks and complications of all of these procedures as well as expected stone free rate were discussed. The patient has chosen percutaneous nephrolithotomy. He presents at this time for that procedure. Percutaneous access has been appropriately obtained by Dr. Ronnell Guadalajara    Technique and findings: The patient was properly identified and marked in the holding area. He had received preoperative Cipro in the interventional radiology suite. He was then taken to the operating room where general endotracheal anesthetic was administered. His bladder was drained with a 16 French Foley catheter. He was then moved to the operating room table in the prone position. All extremities and pressure points were padded appropriately. His right flank was prepped and draped, including the access catheter. Appropriate timeout was then performed.  I then negotiated a floppy tip guidewire down through the Kumpe catheter, and under fluoroscopic guidance guided into the bladder. The  Kumpe catheter was then removed. The 10 French peel-away introducer sheath was then introduced over top of the guidewire after an incision was made adjacent to the guidewire. The core of the peel-away sheath was then removed, and a second floppy tip guidewire was advanced, again under fluoroscopic guidance, into the bladder. The peel-away sheath was then removed. Over top of the working guidewire, I negotiated a NephroMax balloon into the renal pelvis, with the tip adjacent to the identifiable renal pelvic stone. The balloon was then dilated and filled to 20 atm of pressure. Over top of this, after approximately 1 minute of compression, I passed the Amplatz 30 [place] introducer/sheath over top of the balloon, up to the renal pelvic stone. The balloon was then decompressed and removed over top of the guidewire. I then passed the rigid nephroscope through the sheath, and encountered the stone. There was a fair amount of clot in the renal pelvis which was removed. I then fragmented the stone into multiple smaller fragments with the Stonebreaker. These were then extracted with the graspers. Small fragments were removed. I then passed the flexible nephroscope, having removed the rigid nephroscope. The entire pyelocalyceal system was then inspected. One small stone was removed. No other stones were seen. The nephroscope was then passed into the proximal ureter. No stones were seen there. A Nitinol basket was passed into the more distal ureter. No stones were encountered/grasped. Having thoroughly inspected the proximal ureter, the renal pelvis and calyces, no further stones were seen. At this point the scope was removed. Over top of the working guidewire, I then passed a [place] pigtail catheter. Using fluoroscopy, this was negotiated into what I thought was a renal pelvis just distal to the Amplatz sheath. The sheath  was then backed out approximately 3 cm. A n antegrade nephrostogram was then performed with 50%  Omnipaque solution. This revealed adequate positioning of the tip of the pigtail catheter in the renal pelvis. I then cut out the access sheath. The string on the [facility] catheter was then put on tension, and the curl performed in the renal pelvis. I then again passed proximally 20 mL of Omnipaque. This filled the pyelocalyceal system. No perforation/extravasation was seen. There was adequate flow of the contrast into the proximal ureter, alongside the safety wire. There was no evident filling defects seen within the proximal ureter. At this point, the safety wire was removed. I then irrigated the pigtail catheter, and confirmed proper positioning within the renal pelvis. Skin edges were reapproximated around the pigtail catheter using 2-0 silk placed in an vertical mattress fashion. One of the sutures was secured to the pigtail catheter. A dry sterile dressing was placed. The catheter was placed to dependent drainage.  The patient tolerated procedure well. He was awakened and then taken to the PACU in stable condition.

## 2014-05-28 NOTE — Transfer of Care (Signed)
Immediate Anesthesia Transfer of Care Note  Patient: Gary [name]  Procedure(s) Performed: Procedure(s): NEPHROLITHOTOMY PERCUTANEOUS (Right)  Patient Location: PACU  Anesthesia Type:General  Level of Consciousness: awake, alert , oriented and patient cooperative  Airway & Oxygen Therapy: Patient Spontanous Breathing and Patient connected to face mask oxygen  Post-op Assessment: Report given to RN and Post -op Vital signs reviewed and stable  Post vital signs: Reviewed and stable  Last Vitals: There were no vitals filed for this visit.  Complications: No apparent anesthesia complications

## 2014-05-28 NOTE — Progress Notes (Signed)
Post-op note  Subjective: The patient is doing well. Slightly lethargic with some mild nausea. No emesis. Minimal pain  Objective: Vital signs in last 24 hours: Temp:  [97.4 F (36.3 C)-98 F (36.7 C)] 97.7 F (36.5 C) (05/19 1540) Pulse Rate:  [63-93] 69 (05/19 1540) Resp:  [7-16] 16 (05/19 1540) BP: (105-160)/(78-117) 151/91 mmHg (05/19 1540) SpO2:  [95 %-100 %] 100 % (05/19 1540) Weight:  [279 lb (126.554 kg)] 279 lb (126.554 kg) (05/19 0914)  Intake/Output from previous day:   Intake/Output this shift: Total I/O In: 200 [I.V.:200] Out: 720 [Urine:700; Blood:20]  Physical Exam:  General: Alert and oriented.   Urine bloody, without clots-both from nephrostomy tube and Foley catheter.  Lab Results:  Recent Labs  05/28/14 0845  HGB 16.5  HCT 47.8    Assessment/Plan: POD#0   1) Continue to monitor 2) we will check a hemoglobin in the morning. 3) irrigate nephrostomy tube as needed  Gary [name]. Diona Fanti, MD     Gary [name] 05/28/2014, 5:38 PM

## 2014-05-28 NOTE — Discharge Instructions (Signed)
DISCHARGE INSTRUCTIONS FOR PERCUTANEOUS STONE SURGERY MEDICATIONS:  1. DO NOT RESUME YOUR IBUPROFEN, or any other medicines like aspirin, motrin, excedrin, advil, aleve, vitamin E, fish oil as these can all cause bleeding x 10 days.  2. Resume all your other meds from home - except do not take any other pain meds that you may have at home.  ACTIVITY 1. No strenuous activity, sexual activity, or lifting greater than 10 pounds for 2 weeks. 2. No driving while on narcotic pain medications 3. Drink plenty of water 4. Continue to walk at home - you can still get blood clots when you are at home, so keep active, but don't over do it. 5. May return to work in 1 week (but not heavy or strenuous activity).   BATHING You can shower.  Cover your wound with a dressing and remove the dressing immediately after the shower.  Do not submerge wound under water.   WOUND CARE Your wound will drain bloody fluid and may do so for 7-14 days. You have 2 options for dressings:  1. You may use gauze and tape to dress your wound.  If you choose this method, then change the dressing as it becomes soaked.  Change it at least once daily until it stops draining. You may switch to a Band Aid once drainage stops. 2. If drainage is copious, you may use an ostomy device.  This is a bag with an andhesive circle.  The circle has a hole in the middle of it and you cut the hole to the size needed to fit the wound.  This will collect the drainage in the bag and allow you to drain the bag as needed.   SIGNS/SYMPTOMS TO CALL: 1. Please call us if you have a fever greater than 101.5, uncontrolled nausea/vomiting, uncontrolled pain, dizziness, unable to urinate, bloody urine, chest pain, shortness of breath, leg swelling, leg pain, redness around wound, drainage from wound, or any other concerns or questions. 2. You can reach [place] at [phone]. FOLLOW-UP 1.  You will have a follow up appointment 6/1 @ 10:30 ams.

## 2014-05-29 ENCOUNTER — Encounter (HOSPITAL_COMMUNITY): Payer: Self-pay | Admitting: Urology

## 2014-05-29 DIAGNOSIS — M109 Gout, unspecified: Secondary | ICD-10-CM | POA: Diagnosis present

## 2014-05-29 DIAGNOSIS — Z01812 Encounter for preprocedural laboratory examination: Secondary | ICD-10-CM | POA: Diagnosis not present

## 2014-05-29 DIAGNOSIS — Z87891 Personal history of nicotine dependence: Secondary | ICD-10-CM | POA: Diagnosis not present

## 2014-05-29 DIAGNOSIS — Z951 Presence of aortocoronary bypass graft: Secondary | ICD-10-CM | POA: Diagnosis not present

## 2014-05-29 DIAGNOSIS — Z91041 Radiographic dye allergy status: Secondary | ICD-10-CM | POA: Diagnosis not present

## 2014-05-29 DIAGNOSIS — N132 Hydronephrosis with renal and ureteral calculous obstruction: Secondary | ICD-10-CM | POA: Diagnosis present

## 2014-05-29 DIAGNOSIS — R310 Gross hematuria: Secondary | ICD-10-CM | POA: Diagnosis present

## 2014-05-29 DIAGNOSIS — Z6835 Body mass index (BMI) 35.0-35.9, adult: Secondary | ICD-10-CM | POA: Diagnosis not present

## 2014-05-29 DIAGNOSIS — I10 Essential (primary) hypertension: Secondary | ICD-10-CM | POA: Diagnosis present

## 2014-05-29 DIAGNOSIS — G4730 Sleep apnea, unspecified: Secondary | ICD-10-CM | POA: Diagnosis present

## 2014-05-29 DIAGNOSIS — E669 Obesity, unspecified: Secondary | ICD-10-CM | POA: Diagnosis present

## 2014-05-29 DIAGNOSIS — I2510 Atherosclerotic heart disease of native coronary artery without angina pectoris: Secondary | ICD-10-CM | POA: Diagnosis present

## 2014-05-29 LAB — HEMOGLOBIN AND HEMATOCRIT, BLOOD
HCT: 44.3 % (ref 39.0–52.0)
Hemoglobin: 14.9 g/dL (ref 13.0–17.0)

## 2014-05-29 MED ORDER — OXYCODONE HCL 5 MG PO TABS: 5.0000 mg | ORAL_TABLET | ORAL | Status: DC | PRN

## 2014-05-29 MED ORDER — CEPHALEXIN 500 MG PO CAPS: 500.0000 mg | ORAL_CAPSULE | Freq: Three times a day (TID) | ORAL | Status: DC

## 2014-05-29 MED ORDER — ACETAMINOPHEN 10 MG/ML IV SOLN
1000.0000 mg | Freq: Four times a day (QID) | INTRAVENOUS | Status: DC
  Administered 2014-05-29 – 2014-05-30 (×3): 1000 mg via INTRAVENOUS
  Filled 2014-05-29 (×4): qty 100

## 2014-05-29 NOTE — Progress Notes (Signed)
Patient's right nephrostomy tube was clamped and IV NSL. Prior to clamping, pt. Complaining of some nausea and IV zofran was given.  Patient wanted to get OOB to the bathroom after it was clamped.  Tolerated fair going to the bathroom but c/o severe pain, 8/10, on the way back from the bathroom.  IV pain medication was given and instructed patient if pain did not improve within 30 minutes that we would connect his nephrostomy tube to the drainage bag.    Reassessed ~30 minutes later and patient was asleep.  Will assess pain once awake.  Will continue to monitor.

## 2014-05-29 NOTE — Progress Notes (Signed)
1 Day Post-Op Subjective: Patient reports that he slept well. No significant pain.  Objective: Vital signs in last 24 hours: Temp:  [97.4 F (36.3 C)-98.1 F (36.7 C)] 98.1 F (36.7 C) (05/20 0546) Pulse Rate:  [63-93] 75 (05/20 0546) Resp:  [7-16] 16 (05/20 0546) BP: (105-160)/(69-117) 112/69 mmHg (05/20 0546) SpO2:  [94 %-100 %] 94 % (05/20 0546) Weight:  [279 lb (126.554 kg)] 279 lb (126.554 kg) (05/19 0914)  Intake/Output from previous day: 05/19 0701 - 05/20 0700 In: 1288.8 [I.V.:1288.8] Out: 1370 [Urine:1350; Blood:20] Intake/Output this shift:    Physical Exam:  Constitutional: Vital signs reviewed. WD WN in NAD   Eyes: PERRL, No scleral icterus.   Pulmonary/Chest: Normal effort  Urine from nephrostomy tube is somewhat bloody.  Lab Results:  Recent Labs  05/28/14 0845 05/29/14 0340  HGB 16.5 14.9  HCT 47.8 44.3   BMET  Recent Labs  05/28/14 0845  NA 137  K 4.4  CL 103  CO2 22  GLUCOSE 144*  BUN 28*  CREATININE 1.17  CALCIUM 9.5    Recent Labs  05/28/14 0845  INR 0.97   No results for input(s): LABURIN in the last 72 hours. Results for orders placed or performed during the hospital encounter of 04/16/13  MRSA PCR Screening     Status: None   Collection Time: 04/18/13  4:49 PM  Result Value Ref Range Status   MRSA by PCR NEGATIVE NEGATIVE Final    Comment:        The GeneXpert MRSA Assay (FDA approved for NASAL specimens only), is one component of a comprehensive MRSA colonization surveillance program. It is not intended to diagnose MRSA infection nor to guide or monitor treatment for MRSA infections.    Studies/Results: Dg C-arm 1-60 Min-no Report  05/28/2014   CLINICAL DATA: surgery   C-ARM 1-60 MINUTES  Fluoroscopy was utilized by the requesting physician.  No radiographic  interpretation.    Ir Ureteral Stent Right New Access W/o Sep Nephrostomy Cath  05/28/2014   CLINICAL DATA:  59 year old male with 15 mm stone in the right  renal collecting system complicated by recurrent intermittent hematuria. Patient presents for percutaneous access and preparation for percutaneous nephrolithotomy to be performed by Dr. Diona Fanti.  EXAM: IR URETERAL STENT RIGHT NEW ACCESS W/O SEP NEPHROSTOMY CATH  Date: 05/28/2014  PROCEDURE: 1. Fluoroscopically guided puncture of right renal collecting system 2. Second fluoroscopically guided puncture of a posterior midpole calyx 3. Placement of a 5 [place] Kumpe the catheter into the distal ureter. Interventional Radiologist:  Criselda Peaches, MD  ANESTHESIA/SEDATION: Moderate (conscious) sedation was used. 7 mg Versed, 100 mcg Fentanyl were administered intravenously. The patient's vital signs were monitored continuously by radiology nursing throughout the procedure.  Sedation Time: 35 minutes  MEDICATIONS: 400 mg ciprofloxacin administered intravenously within 1 hour of skin incision  FLUOROSCOPY TIME:  9 minutes 42 seconds  541 mGy  CONTRAST:  20 mL Omnipaque 300 administered into the collecting system  TECHNIQUE: Informed consent was obtained from the patient following explanation of the procedure, risks, benefits and alternatives. The patient understands, agrees and consents for the procedure. All questions were addressed. A time out was performed.  Maximal barrier sterile technique utilized including caps, mask, sterile gowns, sterile gloves, large sterile drape, hand hygiene, and Betadine skin prep.  The stone in the lower pole of the right renal collecting system was localized fluoroscopically. Using a posterolateral and caudal approach, an 18 gauge needle was advanced onto the stone  in the lower pole. The stone was freely mobile and was noted to migrate upward toward the renal pelvis.  The stylet of the 18 gauge needle was removed and there was return of clear urine. A hand injection of contrast material opacified the renal collecting system. The 15 mm stone is now positioned within the renal pelvis. A  small amount of air was then injected into the collecting system which opacified the posterior calices. A suitable posterior interpolar calyx was then selected. A 21 gauge Chiba needle was then used to puncture the posterior mid pole calyx. A wire was advanced into the ureter. The transitional Accustick sheath was then advanced over the wire into the ureter. The initial night tricks wire was then exchanged for a 0.035 inch Bentson wire. A 5 [place] Kumpe the catheter was then advanced over the wire and into the distal ureter at the ureterovesicular junction. The patient was then transferred back to short-stay 4 there are impending percutaneous nephrolithotomy.  COMPLICATIONS: None  IMPRESSION: Successful percutaneous access of a posterior midpole calyx with advancement of a 5 French catheter into the distal ureter.  The 15 mm stone is now located within the renal pelvis.  Signed,  Criselda Peaches, MD  [facility]  [facility]   Electronically Signed   By: Jacqulynn Cadet M.D.   On: 05/28/2014 15:15    Assessment/Plan:   Postoperative day 1 right percutaneous nephrolithotomy. He seems to be doing well.    I will clamp his nephrostomy tube. Saline loss IV. If he tolerates tube clamping, will discharge him after discontinuing his tube later on today.     Franchot Gallo M 05/29/2014, 7:27 AM

## 2014-05-29 NOTE — Progress Notes (Signed)
Patient's BP was rechecked and was 183/102, pulse in the low 70s.  Pt. Complaining of increased severe pain and nausea.  Patient stated he felt like he may have a fever but he was afebrile at 98.4 oral.  MD paged with findings.  Orders to give IV Dilaudid and IV Zofran now.  MD stated he would call to follow up within the hour.  Will continue to monitor.

## 2014-05-29 NOTE — Care Management Note (Signed)
Case Management Note  Patient Details  Name: Gary [name] MRN: [id number] Date of Birth: [birth year]  Subjective/Objective:                    Action/Plan:Discharged to home with No HH needs   Expected Discharge Date:                  Expected Discharge Plan:  Home/Self Care  In-House Referral:     Discharge planning Services  CM Consult  Post Acute Care Choice:    Choice offered to:     DME Arranged:    DME Agency:     HH Arranged:    [place] Agency:     Status of Service:  Completed, signed off  Medicare Important Message Given:    Date Medicare IM Given:    Medicare IM give by:    Date Additional Medicare IM Given:    Additional Medicare Important Message give by:     If discussed at [place] of Stay Meetings, dates discussed:    Additional Comments:  Delrae Sawyers, RN 05/29/2014, 2:47 PM

## 2014-05-29 NOTE — Progress Notes (Signed)
During routine VS check, patient's BP was 171/97 and patient was c/o moderate to severe pain.  PO pain medication given to patient.  Will reassess pain and BP in one hour to see if it has improved and will notify MD at that time if no improvement.

## 2014-05-29 NOTE — Progress Notes (Signed)
The pt experienced HBP and pain after PCN tube removed. BP normalized w/ dilaudid. I feel we should admit him for inpt stay overnight. His flank dressinfg is dry. I will order renal u/s for am. If he is comfortable and u/s looks ok, I feel d/c to home then would be appropriate.

## 2014-05-30 NOTE — Discharge Summary (Signed)
Physician Discharge Summary  Patient ID: Gary [name] MRN: [id number] DOB/AGE: Mar 12, 1955 59 y.o.  Admit date: 05/28/2014 Discharge date: 05/30/2014  Admission Diagnoses: Right renal pelvis stone  Discharge Diagnoses:  Active Problems:   Renal stones   Hydronephrosis with renal and ureteral calculus obstruction   Discharged Condition: good  Hospital Course: He underwent a percutaneous nephrolithotomy for a 1.5 cm right renal pelvic stone on 5/19. He did well postoperatively and his diet was advanced. The procedure was performed without the use of a double-J stent but he had a percutaneous nephrostomy tube left in place. His tube was clamped following day and he was not having any pain. The tube was therefore removed however he then began developing flank pain. He continued to have a good urine output and did have some blood-tinged urine indicating that there was flow through his ureter. He did not develop significant drainage from his nephrostomy site and because of this a renal ultrasound was ordered. His hemoglobin had remained stable. Throughout the rest of the evening and next morning he had no further flank pain or excessive drainage from his nephrostomy tube site. His urine remained slightly blood-tinged. It was felt his renal ultrasound was not necessary and because he was doing much better he was felt to be ready for discharge. He was told to contact us if he developed any increased flank pain, drainage or other difficulties.  Discharge Exam: Blood pressure 131/85, pulse 71, temperature 97.6 F (36.4 C), temperature source Oral, resp. rate 16, height 6\' 2"  (1.88 m), weight 126.554 kg (279 lb), SpO2 98 %. General appearance: alert and no distress  Flank without d/c or ecchymosis. Abd. - soft and nontender  Disposition: 01-Home or Self Care  Discharge Instructions    Discharge patient    Complete by:  As directed             Medication List    STOP taking these medications         aspirin 325 MG EC tablet     aspirin EC 81 MG tablet     predniSONE 50 MG tablet  Commonly known as:  DELTASONE      TAKE these medications        allopurinol 300 MG tablet  Commonly known as:  ZYLOPRIM  Take 300 mg by mouth daily. Take 300 mg along with 100 mg to equal 400 mg     allopurinol 100 MG tablet  Commonly known as:  ZYLOPRIM  Take 100 mg by mouth every morning. Take 100 mg along with 300 mg to equal 400 mg total daily dose.     atorvastatin 10 MG tablet  Commonly known as:  LIPITOR  Take 1 tablet (10 mg total) by mouth daily at 6 PM.     CENTRUM SILVER PO  Take 1 tablet by mouth every morning.     cephALEXin 500 MG capsule  Commonly known as:  KEFLEX  Take 1 capsule (500 mg total) by mouth 3 (three) times daily.     diphenhydrAMINE 25 MG tablet  Commonly known as:  SOMINEX  Take 50 mg by mouth at bedtime as needed for sleep.     naproxen sodium 220 MG tablet  Commonly known as:  ANAPROX  Take 440 mg by mouth 2 (two) times daily as needed (pain.).     oxyCODONE 5 MG immediate release tablet  Commonly known as:  Oxy IR/ROXICODONE  Take 1 tablet (5 mg total) by mouth every 4 (four) hours  as needed for moderate pain.     REFRESH OP  Place 1 drop into both eyes 2 (two) times daily as needed (dry eyes).     tamsulosin 0.4 MG Caps capsule  Commonly known as:  FLOMAX  Take 0.4 mg by mouth every morning.     vitamin C 1000 MG tablet  Take 2,000 mg by mouth every morning.           Follow-up Information    Follow up with Jorja Loa, MD On 06/10/2014.   Specialty:  Urology   Why:  10:30 am   Contact information:   [place] [address] [phone]       Signed: Claybon Jabs 05/30/2014, 9:23 AM

## 2014-08-13 IMAGING — CR DG CHEST 2V
2 series · 2 of 2 positions shown · non-contrast
Comparison: Two-view chest 06/01/2013.

CLINICAL DATA: Shortness breath and fever.

EXAM:
CHEST  2 VIEW

[w chest pa]
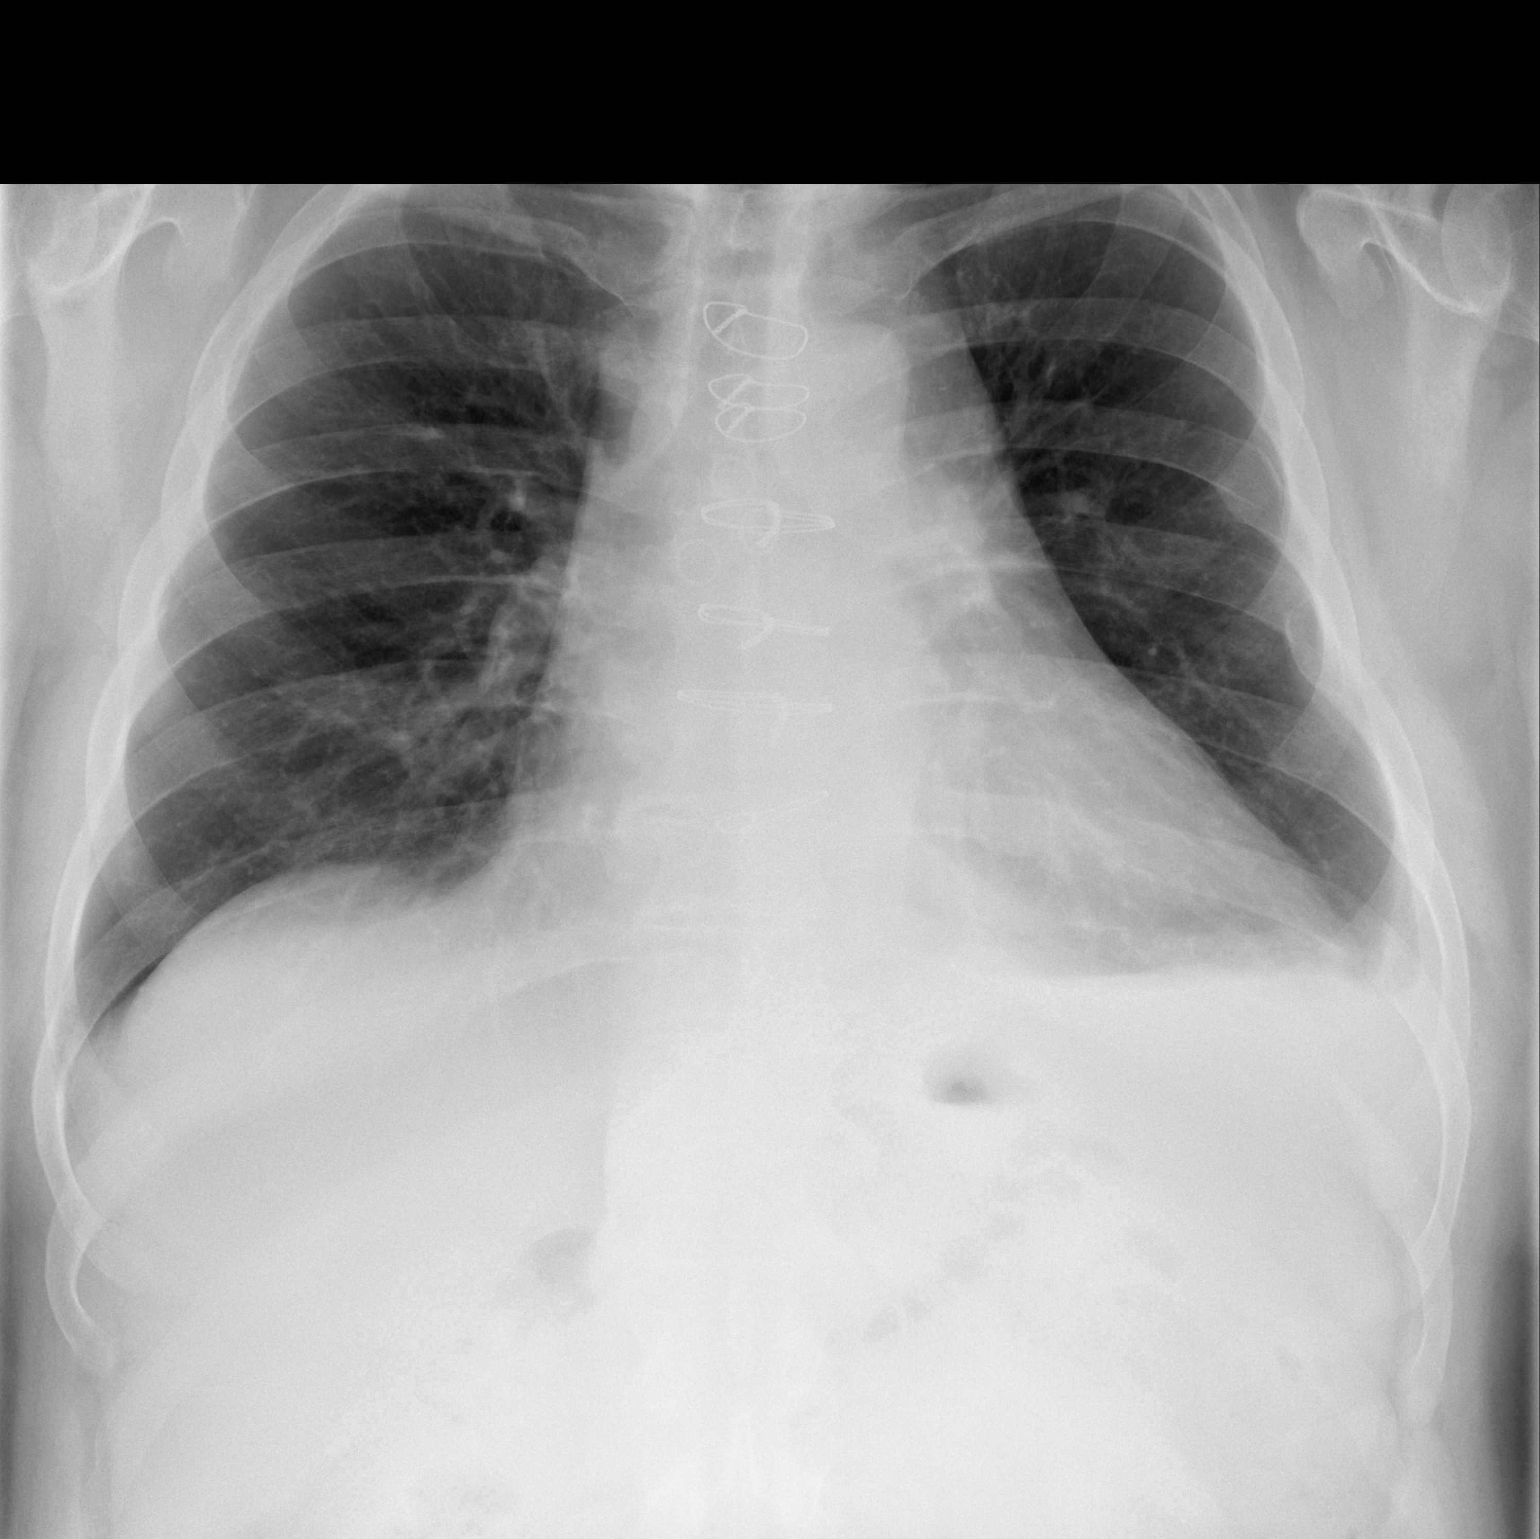

[w chest lat]
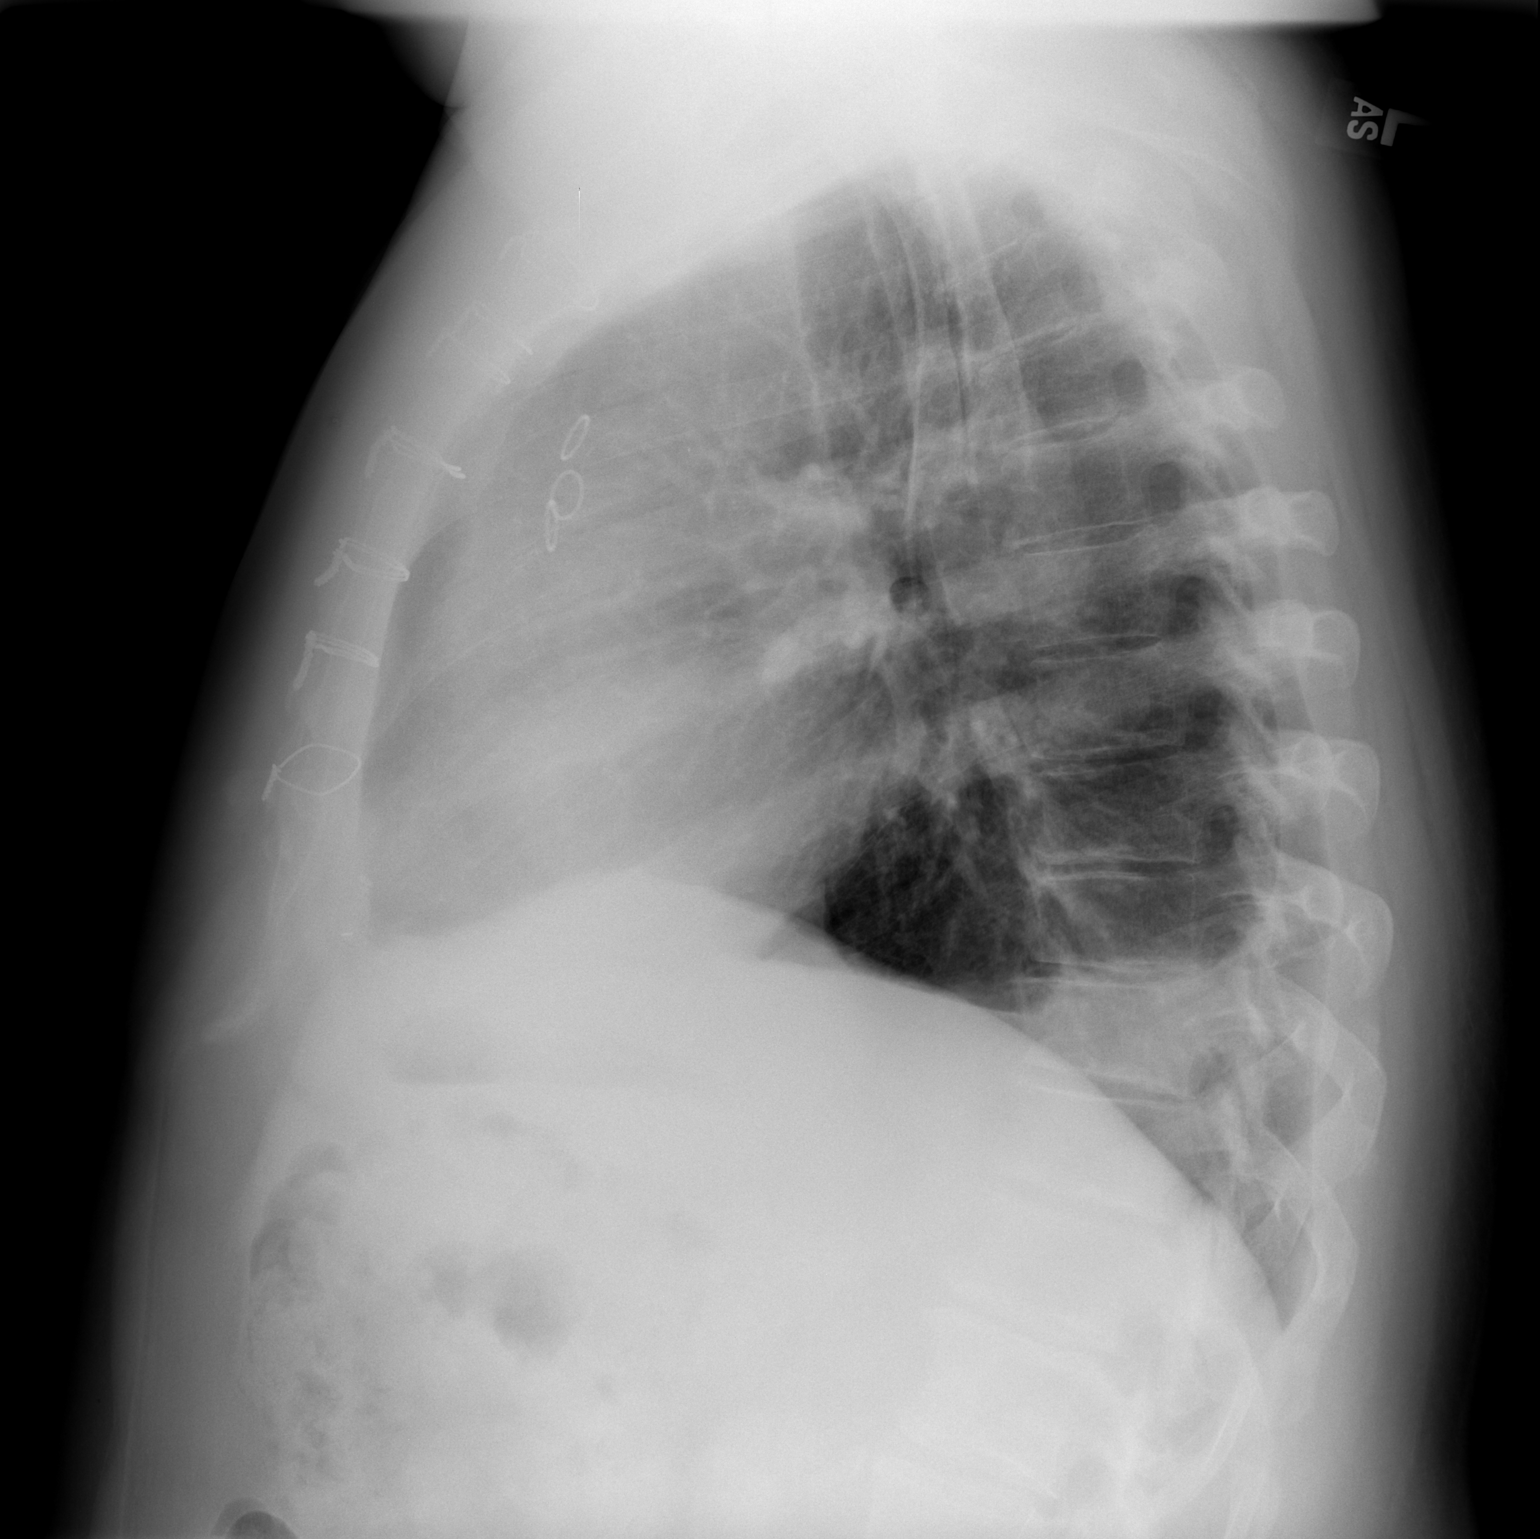

[2 of 2 positions shown; findings below may reference images not displayed]

FINDINGS: The patient is status post median sternotomy and for CABG. Mild
cardiomegaly is stable. The left pleural effusion has decreased. The
right pleural effusion is essentially resolved. Lung volumes remain
low. Mild left basilar atelectasis is present. No other focal
airspace disease is evident. Remote left-sided rib fractures are
again seen.
IMPRESSION: 1. Stable cardiomegaly.
2. Improving left pleural effusion with minimal atelectasis.
3. Resolved right pleural effusion.
4. The lungs are otherwise clear.

## 2015-07-22 DIAGNOSIS — H5201 Hypermetropia, right eye: Secondary | ICD-10-CM | POA: Diagnosis not present

## 2015-08-13 DIAGNOSIS — M1000 Idiopathic gout, unspecified site: Secondary | ICD-10-CM | POA: Diagnosis not present

## 2015-08-13 DIAGNOSIS — I10 Essential (primary) hypertension: Secondary | ICD-10-CM | POA: Diagnosis not present

## 2015-08-13 DIAGNOSIS — E782 Mixed hyperlipidemia: Secondary | ICD-10-CM | POA: Diagnosis not present

## 2015-08-20 DIAGNOSIS — I2510 Atherosclerotic heart disease of native coronary artery without angina pectoris: Secondary | ICD-10-CM | POA: Diagnosis not present

## 2015-08-20 DIAGNOSIS — M1A0790 Idiopathic chronic gout, unspecified ankle and foot, without tophus (tophi): Secondary | ICD-10-CM | POA: Diagnosis not present

## 2015-08-20 DIAGNOSIS — I10 Essential (primary) hypertension: Secondary | ICD-10-CM | POA: Diagnosis not present

## 2015-08-20 DIAGNOSIS — E782 Mixed hyperlipidemia: Secondary | ICD-10-CM | POA: Diagnosis not present

## 2015-08-24 DIAGNOSIS — R000 Tachycardia, unspecified: Secondary | ICD-10-CM | POA: Diagnosis not present

## 2015-08-24 DIAGNOSIS — I2510 Atherosclerotic heart disease of native coronary artery without angina pectoris: Secondary | ICD-10-CM | POA: Diagnosis not present

## 2015-08-24 DIAGNOSIS — R002 Palpitations: Secondary | ICD-10-CM | POA: Diagnosis not present

## 2015-08-25 DIAGNOSIS — R002 Palpitations: Secondary | ICD-10-CM | POA: Diagnosis not present

## 2015-10-15 DIAGNOSIS — N401 Enlarged prostate with lower urinary tract symptoms: Secondary | ICD-10-CM | POA: Diagnosis not present

## 2015-10-15 DIAGNOSIS — R3912 Poor urinary stream: Secondary | ICD-10-CM | POA: Diagnosis not present

## 2015-10-15 DIAGNOSIS — N200 Calculus of kidney: Secondary | ICD-10-CM | POA: Diagnosis not present

## 2015-11-29 DIAGNOSIS — Z23 Encounter for immunization: Secondary | ICD-10-CM | POA: Diagnosis not present

## 2016-02-18 DIAGNOSIS — E782 Mixed hyperlipidemia: Secondary | ICD-10-CM | POA: Diagnosis not present

## 2016-02-18 DIAGNOSIS — Z125 Encounter for screening for malignant neoplasm of prostate: Secondary | ICD-10-CM | POA: Diagnosis not present

## 2016-02-18 DIAGNOSIS — M1A0790 Idiopathic chronic gout, unspecified ankle and foot, without tophus (tophi): Secondary | ICD-10-CM | POA: Diagnosis not present

## 2016-02-18 DIAGNOSIS — I10 Essential (primary) hypertension: Secondary | ICD-10-CM | POA: Diagnosis not present

## 2016-02-18 DIAGNOSIS — Z0000 Encounter for general adult medical examination without abnormal findings: Secondary | ICD-10-CM | POA: Diagnosis not present

## 2016-02-25 DIAGNOSIS — M1A0790 Idiopathic chronic gout, unspecified ankle and foot, without tophus (tophi): Secondary | ICD-10-CM | POA: Diagnosis not present

## 2016-02-25 DIAGNOSIS — E782 Mixed hyperlipidemia: Secondary | ICD-10-CM | POA: Diagnosis not present

## 2016-02-25 DIAGNOSIS — Z0000 Encounter for general adult medical examination without abnormal findings: Secondary | ICD-10-CM | POA: Diagnosis not present

## 2016-02-25 DIAGNOSIS — I2510 Atherosclerotic heart disease of native coronary artery without angina pectoris: Secondary | ICD-10-CM | POA: Diagnosis not present

## 2016-03-27 DIAGNOSIS — E785 Hyperlipidemia, unspecified: Secondary | ICD-10-CM | POA: Diagnosis not present

## 2016-03-27 DIAGNOSIS — E668 Other obesity: Secondary | ICD-10-CM | POA: Diagnosis not present

## 2016-03-27 DIAGNOSIS — I10 Essential (primary) hypertension: Secondary | ICD-10-CM | POA: Diagnosis not present

## 2016-03-27 DIAGNOSIS — I2510 Atherosclerotic heart disease of native coronary artery without angina pectoris: Secondary | ICD-10-CM | POA: Diagnosis not present

## 2016-07-31 DIAGNOSIS — Z951 Presence of aortocoronary bypass graft: Secondary | ICD-10-CM | POA: Diagnosis not present

## 2016-07-31 DIAGNOSIS — Z86010 Personal history of colonic polyps: Secondary | ICD-10-CM | POA: Diagnosis not present

## 2016-09-04 DIAGNOSIS — K5730 Diverticulosis of large intestine without perforation or abscess without bleeding: Secondary | ICD-10-CM | POA: Diagnosis not present

## 2016-09-04 DIAGNOSIS — D126 Benign neoplasm of colon, unspecified: Secondary | ICD-10-CM | POA: Diagnosis not present

## 2016-09-04 DIAGNOSIS — Z86010 Personal history of colonic polyps: Secondary | ICD-10-CM | POA: Diagnosis not present

## 2016-09-06 DIAGNOSIS — D126 Benign neoplasm of colon, unspecified: Secondary | ICD-10-CM | POA: Diagnosis not present

## 2016-09-18 DIAGNOSIS — H33312 Horseshoe tear of retina without detachment, left eye: Secondary | ICD-10-CM | POA: Diagnosis not present

## 2016-09-20 DIAGNOSIS — G4733 Obstructive sleep apnea (adult) (pediatric): Secondary | ICD-10-CM | POA: Diagnosis not present

## 2016-09-20 DIAGNOSIS — K5730 Diverticulosis of large intestine without perforation or abscess without bleeding: Secondary | ICD-10-CM | POA: Diagnosis not present

## 2016-09-20 DIAGNOSIS — K635 Polyp of colon: Secondary | ICD-10-CM | POA: Diagnosis not present

## 2016-09-20 DIAGNOSIS — Z23 Encounter for immunization: Secondary | ICD-10-CM | POA: Diagnosis not present

## 2016-10-26 ENCOUNTER — Encounter: Payer: Self-pay | Admitting: Neurology

## 2016-10-30 ENCOUNTER — Ambulatory Visit (INDEPENDENT_AMBULATORY_CARE_PROVIDER_SITE_OTHER): Payer: BLUE CROSS/BLUE SHIELD | Admitting: Neurology

## 2016-10-30 ENCOUNTER — Encounter: Payer: Self-pay | Admitting: Neurology

## 2016-10-30 VITALS — BP 126/88 | HR 87 | Ht 74.0 in | Wt 288.0 lb

## 2016-10-30 DIAGNOSIS — R0683 Snoring: Secondary | ICD-10-CM

## 2016-10-30 DIAGNOSIS — I2510 Atherosclerotic heart disease of native coronary artery without angina pectoris: Secondary | ICD-10-CM | POA: Diagnosis not present

## 2016-10-30 DIAGNOSIS — J302 Other seasonal allergic rhinitis: Secondary | ICD-10-CM

## 2016-10-30 DIAGNOSIS — I4891 Unspecified atrial fibrillation: Secondary | ICD-10-CM

## 2016-10-30 DIAGNOSIS — G4733 Obstructive sleep apnea (adult) (pediatric): Secondary | ICD-10-CM

## 2016-10-30 MED ORDER — MOMETASONE FUROATE 50 MCG/ACT NA SUSP: 2.0000 | Freq: Every day | NASAL | 12 refills | Status: DC

## 2016-10-30 NOTE — Patient Instructions (Signed)

## 2016-10-30 NOTE — Progress Notes (Signed)
SLEEP MEDICINE CLINIC   Provider:  Larey Seat, [place] D  Primary Care Physician:  Merrilee Seashore, MD   Referring Provider: Merrilee Seashore, MD    Chief Complaint  Patient presents with  . New Patient (Initial Visit)    pt alone, rm 11, pt was told during a procedure that he stopped breathing a couple times. he was instructed to follow up for sleep.  sleep study was done 15 years ago and was diagnosed with sleep apnea was never started on the machine.     HPI:  Gary [name] is a 61 y.o. male , seen here as in a referral  from Dr. Ashby Dawes for Evaluation of possible sleep apnea.  [name] explained that he had undergone a colonoscopy in September of this year with Dr. Narda Amber, witnessed apneas but he was sedated and recovering from the procedure. He had been reluctant to undergo a new evaluation for possible sleep apnea as he had a polysomnography over 10 years ago, was placed on CPAP, which he could not tolerate. Dr. Ashby Dawes initiated there for this referral. He is also concerned about the patient's BMI which is 39, and has CAD with high cholesterol levels.He also suffers from allergic rhinitis, has become a habitual mouth breather and has seasonally suffered from bronchitis. In the meantime,since the last PSG was performed, the patient had developed coronary artery disease and underwent 6 bypass procedures.   Sleep habits are as follows:The patient usually watches TV before going to bed, often falling asleep in the den while watching. He sleeps in a recliner until 4 or 5 AM some days then transfers to his bedroom and proper bed. He wakes up often around 4 AM either to transfer from the den to the bedroom door wakes up in his bedroom and has sometimes trouble to fall asleep again. He rises usually at 6 AM, and his average sleep varies between 4 and 8 hours. He usually has one nocturia. Some nights he has one scotch before going to bed.He did not indicate a favored sleep  position, sleeps on one pillow, the bedroom is cool, quiet and dark. He shares a bedroom with his wife.   Sleep medical history and family sleep history: The patient does not recall having ever slept walked or having at night terrors in childhood,He has developed insomnia, suffering more from an inability to sustain sleep than to initiate sleep. He also doesn't feel always refreshed and restored in the morning he has been witnessed to snore, has often a dry mouth and excessive daytime sleepiness. He endorsed STOP BANG questionnaire at 9 points,  Social history: The patient is married with one son. He works in Press photographer, Transport planner, Therapist, art, travels often by car, this office has windows he is a former smoker he drinks 5-7 drinks a week not more rub or less, he does not consume caffeine neither in form of energy drinks no coffee, nor tea, nor soda.   Review of Systems: Out of a complete 14 system review, the patient complains of only the following symptoms, and all other reviewed systems are negative. Endorsed the Epworth sleepiness score at 7 out of 24 points, the fatigue severity score at 9 out of 63 points, no indication of depression. Sleepiness and snoring   Social History   Social History  . Marital status: Married    Spouse name: N/A  . Number of children: N/A  . Years of education: N/A   Occupational History  . Not on file.  Social History Main Topics  . Smoking status: Former Smoker    Years: 25.00    Quit date: 05/13/1994  . Smokeless tobacco: Never Used  . Alcohol use Yes     Comment: occasional  . Drug use: No  . Sexual activity: Yes   Other Topics Concern  . Not on file   Social History Narrative   ** Merged History Encounter **        Family History  Problem Relation Age of Onset  . Heart attack Father   . Pancreatic cancer Father   . Melanoma Brother     Past Medical History:  Diagnosis Date  . CAD (coronary artery disease)   . Essential hypertension     . Gout   . Kidney stones   . Obesity (BMI 30-39.9)   . S/P CABG x 6   . Sleep apnea    unable to use c-pap    Past Surgical History:  Procedure Laterality Date  . CORONARY ARTERY BYPASS GRAFT N/A 04/18/2013   Procedure: CORONARY ARTERY BYPASS GRAFTING (CABG) TIMES SIX USING LEFT INTERNAL MAMMARY ARTERY AND RIGHT SAPHENOUS LEG VEIN HARVESTED ENDOSCOPICALLY;  Surgeon: [name], MD;  Location: [facility];  Service: Open Heart Surgery;  Laterality: N/A;  . INTRAOPERATIVE TRANSESOPHAGEAL ECHOCARDIOGRAM N/A 04/18/2013   Procedure: INTRAOPERATIVE TRANSESOPHAGEAL ECHOCARDIOGRAM;  Surgeon: [name], MD;  Location: [facility];  Service: Open Heart Surgery;  Laterality: N/A;  . KIDNEY STONE SURGERY    . LEFT AND RIGHT HEART CATHETERIZATION WITH CORONARY ANGIOGRAM N/A 04/16/2013   Procedure: LEFT AND RIGHT HEART CATHETERIZATION WITH CORONARY ANGIOGRAM;  Surgeon: Jacolyn Reedy, MD;  Location: [facility] CATH [facility];  Service: Cardiovascular;  Laterality: N/A;  . NEPHROLITHOTOMY Right 05/28/2014   Procedure: NEPHROLITHOTOMY PERCUTANEOUS;  Surgeon: Franchot Gallo, MD;  Location: [facility];  Service: Urology;  Laterality: Right;    Current Outpatient Prescriptions  Medication Sig Dispense Refill  . allopurinol (ZYLOPRIM) 100 MG tablet Take 100 mg by mouth daily.    Marland Kitchen allopurinol (ZYLOPRIM) 300 MG tablet Take 300 mg by mouth daily. Take 300 mg along with 100 mg to equal 400 mg    . Ascorbic Acid (VITAMIN C) 1000 MG tablet Take 2,000 mg by mouth every morning.     Marland Kitchen atorvastatin (LIPITOR) 10 MG tablet Take 1 tablet (10 mg total) by mouth daily at 6 PM. 30 tablet 1  . cephALEXin (KEFLEX) 500 MG capsule Take 1 capsule (500 mg total) by mouth 3 (three) times daily. 10 capsule 0  . diphenhydrAMINE (SOMINEX) 25 MG tablet Take 50 mg by mouth at bedtime as needed for sleep.    . Multiple Vitamins-Minerals (CENTRUM SILVER PO) Take 1 tablet by mouth every morning.     . naproxen sodium (ANAPROX) 220 MG tablet Take  440 mg by mouth 2 (two) times daily as needed (pain.).    Marland Kitchen oxyCODONE (OXY IR/ROXICODONE) 5 MG immediate release tablet Take 1 tablet (5 mg total) by mouth every 4 (four) hours as needed for moderate pain. 30 tablet 0  . Polyvinyl Alcohol-Povidone (REFRESH OP) Place 1 drop into both eyes 2 (two) times daily as needed (dry eyes).    . tamsulosin (FLOMAX) 0.4 MG CAPS capsule Take 0.4 mg by mouth every morning.     No current facility-administered medications for this visit.     Allergies as of 10/30/2016 - Review Complete 10/30/2016  Allergen Reaction Noted  . Contrast media [iodinated diagnostic agents] Hives, Shortness Of Breath, and  Nausea And Vomiting 04/16/2013  . Contrast media [iodinated diagnostic agents]  04/15/2013    Vitals: BP 126/88   Pulse 87   Ht 6\' 2"  (1.88 m)   Wt 288 lb (130.6 kg)   BMI 36.98 kg/m  Last Weight:  Wt Readings from Last 1 Encounters:  10/30/16 288 lb (130.6 kg)   MLY:YTKP mass index is 36.98 kg/m.     Last Height:   Ht Readings from Last 1 Encounters:  10/30/16 6\' 2"  (1.88 m)    Physical exam:  General: The patient is awake, alert and appears not in acute distress. The patient is truncally obese.  Head: Normocephalic, atraumatic. Neck is supple. Mallampati 4,  neck circumference: 21. Nasal airflow congested , slight nasal septal deviation,   Cardiovascular:  Regular rate and rhythm, without  murmurs or carotid bruit, and without distended neck veins. Respiratory: Lungs are clear to auscultation. Skin:  Without evidence of edema, or rash Trunk: BMI is 38.92. The patient's posture is erect.   Neurologic exam : Cranial nerves: Pupils are equal and briskly reactive to light. Funduscopic exam unremarkable, status post lasix.  Extraocular movements  in vertical and horizontal planes intact and without nystagmus. Visual fields by finger perimetry are intact. Hearing to finger rub intact. Facial sensation intact to fine touch.Facial motor strength is  symmetric and tongue and uvula move midline. Shoulder shrug was symmetrical.   Motor exam:  Normal tone, muscle bulk and symmetric strength in all extremities. Sensory:  Fine touch, pinprick and vibration were normal. Coordination: Rapid alternating movements in the fingers/hands was normal. Finger-to-nose maneuverwithout evidence of ataxia, dysmetria or tremor. Gait and station: Patient walks without assistive device .Tandem gait deferred - Deep tendon reflexes: in the  upper and lower extremities are symmetric and intact. Babinski maneuver response is  downgoing.  Assessment:  After physical and neurologic examination, review of laboratory studies,  Personal review of imaging studies, reports of other /same  Imaging studies, results of polysomnography and / or neurophysiology testing and pre-existing records.  I reviewed dr Mathis Fare notes and appreciate his referral.   1) Mr. Drue Flirt clearly has high risk factors for the presence of obstructive sleep apnea. A morbidly obese Caucasian gentleman at age 89 with a history of significant coronary artery disease and bypass surgery, hypercholesterolemia and witnessed snoring and apnea after sedation for colonoscopy. In contrast to these objective findings his subjective report is rather low rating of his subjective complaints, and showing neither excessive daytime sleepiness nor high degree of fatigue and  no other symptoms are mentioned, not even gouty arthritis or kidney stones.  The patient was advised of the nature of the diagnosed disorder , the treatment options and the  risks for general health and wellness arising from not treating the condition.   I spent more than 45 minutes of face to face time with the patient.  Greater than 50% of time was spent in counseling and coordination of care. We have discussed the diagnosis and differential and I answered the patient's questions.    Plan:  Treatment plan and additional workup :  SPLIT night  , AHI 20, 3 % ,  Nasonex     Larey Seat, MD 54/[id number]/6812, 7:51 AM  Certified in Neurology by ABPN Certified in [place] by [facility] [address] [place], [place] [address]

## 2016-11-03 DIAGNOSIS — H5212 Myopia, left eye: Secondary | ICD-10-CM | POA: Diagnosis not present

## 2016-11-03 DIAGNOSIS — H5201 Hypermetropia, right eye: Secondary | ICD-10-CM | POA: Diagnosis not present

## 2016-11-11 ENCOUNTER — Ambulatory Visit (INDEPENDENT_AMBULATORY_CARE_PROVIDER_SITE_OTHER): Payer: BLUE CROSS/BLUE SHIELD | Admitting: Neurology

## 2016-11-11 DIAGNOSIS — J302 Other seasonal allergic rhinitis: Secondary | ICD-10-CM

## 2016-11-11 DIAGNOSIS — E66813 Obesity, class 3: Secondary | ICD-10-CM

## 2016-11-11 DIAGNOSIS — I4891 Unspecified atrial fibrillation: Secondary | ICD-10-CM

## 2016-11-11 DIAGNOSIS — G4733 Obstructive sleep apnea (adult) (pediatric): Secondary | ICD-10-CM | POA: Diagnosis not present

## 2016-11-11 DIAGNOSIS — I2510 Atherosclerotic heart disease of native coronary artery without angina pectoris: Secondary | ICD-10-CM

## 2016-11-11 DIAGNOSIS — R0683 Snoring: Secondary | ICD-10-CM

## 2016-11-15 ENCOUNTER — Telehealth: Payer: Self-pay | Admitting: Neurology

## 2016-11-15 ENCOUNTER — Other Ambulatory Visit: Payer: Self-pay | Admitting: Neurology

## 2016-11-15 DIAGNOSIS — I10 Essential (primary) hypertension: Secondary | ICD-10-CM

## 2016-11-15 DIAGNOSIS — R0902 Hypoxemia: Secondary | ICD-10-CM

## 2016-11-15 DIAGNOSIS — G4731 Primary central sleep apnea: Secondary | ICD-10-CM

## 2016-11-15 DIAGNOSIS — I480 Paroxysmal atrial fibrillation: Secondary | ICD-10-CM

## 2016-11-15 DIAGNOSIS — I2510 Atherosclerotic heart disease of native coronary artery without angina pectoris: Secondary | ICD-10-CM

## 2016-11-15 NOTE — Telephone Encounter (Signed)
I called pt. I advised pt that Dr. Brett Fairy reviewed their sleep study results and found that pt has sleep apnea. Dr. Brett Fairy recommends that pt needs to start an auto CPAP. I reviewed PAP compliance expectations with the pt. Pt is agreeable to starting a CPAP. I advised pt that an order will be sent to a DME, [facility], and [facility] will call the pt within about one week after they file with the pt's insurance. [facility] will show the pt how to use the machine, fit for masks, and troubleshoot the CPAP if needed. A follow up appt was made for insurance purposes with Justin Mend on Jan 18, 2017 at 9:30 am. Pt verbalized understanding to arrive 15 minutes early and bring their CPAP. A letter with all of this information in it will be mailed to the pt as a reminder. I verified with the pt that the address we have on file is correct. Pt verbalized understanding of results. Pt had no questions at this time but was encouraged to call back if questions arise.

## 2016-11-15 NOTE — Telephone Encounter (Signed)
-----   Message from Larey Seat, MD sent at 11/15/2016  8:38 AM EST ----- SEvere, COMPLEX sleep apnea with desaturations, and PAP is only treatment option.   Will order auto titrator CPAP but set to 13 cm water with 3 cm EPR- will follow 50-90 days after that. CD

## 2016-11-15 NOTE — Procedures (Signed)
PATIENT'S NAME:  Gary [name], Gary [name] DOB:      08-Mar-1955      MR#:    [id number]     DATE OF RECORDING: 11/11/2016 REFERRING M.D.:  Merrilee Seashore, M.D. Study Performed:  Split-Night Titration Study HISTORY:  [name] explained that he had undergone a colonoscopy in September of this year with Dr. Narda Amber, and apneas were witnessed while he was sedated and recovering from the procedure.  He had been reluctant to undergo a new evaluation for possible sleep apnea as he had a polysomnography over 10 years ago, was placed on CPAP, which he could not tolerate.  The patient endorsed the Epworth Sleepiness Scale at 7 points   The patient's weight 288 pounds with a height of 74 (inches), resulting in a BMI of 37.1 kg/m2. The patient's neck circumference measured 21 inches.  CURRENT MEDICATIONS: Zyloprim, Lipitor, Vitamin C, Keflex, Sominex, Centrum Silver, Anaprox, Oxycodone, Flomax, Refresh eye drops    PROCEDURE:  This is a multichannel digital polysomnogram utilizing the Somnostar 11.2 system.  Electrodes and sensors were applied and monitored per AASM Specifications.   EEG, EOG, Chin and Limb EMG, were sampled at 200 Hz.  ECG, Snore and Nasal Pressure, Thermal Airflow, Respiratory Effort, CPAP Flow and Pressure, Oximetry was sampled at 50 Hz. Digital video and audio were recorded.      BASELINE STUDY WITHOUT CPAP RESULTS:  Lights Out was at 22:17 and Lights On at 05:00.  Total recording time (TRT) was 130.5, with a total sleep time (TST) of 75.5 minutes.   The patient's sleep latency was 53.5 minutes.  REM latency was 61 minutes.  The sleep efficiency was 57.9 %.    SLEEP ARCHITECTURE: WASO (Wake after sleep onset) was 19.5 minutes, Stage N1 was 10 minutes, Stage N2 was 65 minutes, Stage N3 was 0 minutes and Stage R (REM sleep) was 0.5 minutes.  The percentages were Stage N1 13.2%, Stage N2 86.1%, Stage N3 0% and Stage R (REM sleep) .7%.   RESPIRATORY ANALYSIS:  There were a total of 110  respiratory events:  104 obstructive apneas, 0 central apneas and 4 mixed apneas with 2 hypopneas. The patient also had 0 respiratory event related arousals (RERAs). Snoring was noted. The total APNEA/HYPOPNEA INDEX (AHI) was 87.4 /hour and the total RESPIRATORY DISTURBANCE INDEX was 87.4 /hour.  1 events occurred in REM sleep and 8 events in NREM. The REM AHI was 120, /hour versus a non-REM AHI of 87.2 /hour. The patient spent 55 minutes sleep time in the supine position 197 minutes in non-supine. The supine AHI was 60.0 /hour versus a non-supine AHI of 87.8 /hour.  OXYGEN SATURATION & C02:  The wake baseline 02 saturation was 96%, with the lowest being 77%. Time spent below 89% saturation equaled 63 minutes.   PERIODIC LIMB MOVEMENTS:    The patient had a total of few Periodic Limb Movements.  The arousals were noted as: 8 were spontaneous, 1 were associated with PLMs, and 104 were associated with respiratory events. Audio and video analysis did not show any abnormal or unusual movements, behaviors, phonations or vocalizations. No nocturia noted. EKG with frequent PVCs (NSR).  TITRATION STUDY WITH CPAP RESULTS:   CPAP was initiated at 5.0 cmH20 with heated humidity per AASM split night standards and pressure was advanced to 12 cmH20 because of hypopneas, apneas and desaturations.  At a PAP pressure of 12 cmH20, there was a reduction of the AHI to 4.0 /hour.   Total recording time (TRT)  was 271.5 minutes, with a total sleep time (TST) of 176.5 minutes. The patient's sleep latency was 100 minutes. REM latency was 127.5 minutes.  The sleep efficiency was 65.1 %.    SLEEP ARCHITECTURE: Wake after sleep was 86 minutes, Stage N1 15.5 minutes, Stage N2 92 minutes, Stage N3 22.5 minutes and Stage R (REM sleep) 46.5 minutes. The percentages were: Stage N1 8.8%, Stage N2 52.1%, Stage N3 12.7% and Stage R (REM sleep) 26.3%. The sleep architecture was notable for REM rebound.  RESPIRATORY ANALYSIS:  There  were a total of 29 respiratory events: 11 obstructive apneas, 1 central apnea and 1 mixed apnea with a total of 13 apneas and 16 hypopneas. The patient also had 1 respiratory event related arousal (RERAs).     The total APNEA/HYPOPNEA INDEX (AHI) was 9.9 /hour and the total RESPIRATORY DISTURBANCE INDEX was 10.2 /hour.  9 events occurred in REM sleep and 20 events in NREM. The REM AHI was 11.6 /hour versus a non-REM AHI of 9.2 /hour. The patient spent 31% of total sleep time in the supine position. The supine AHI was 10.0 /hour, versus a non-supine AHI of 9.8/hour.  OXYGEN SATURATION & C02:  The wake baseline 02 saturation was 97%, with the lowest being 87%. Time spent below 89% saturation equaled 0 minutes.  PERIODIC LIMB MOVEMENTS:   The patient had a total of 192 Periodic Limb Movements. The arousals were noted as: 11 were spontaneous, 5 were associated with PLMs, and 14 were associated with respiratory events. The Periodic Limb Movement arousal index (PLM) was 1.7 /hour. The Periodic Limb Movement (PLM) index was 65.3 /hour and the PLM Arousal index was 1.7 /hour. Sleep was improved under CPAP.  POLYSOMNOGRAPHY IMPRESSION :   1. Complex, Mostly Obstructive Sleep Apnea (OSA) at severe degree. 2. Severe hypoxemia noted with a total desaturation time of 63 minutes.  3. Periodic Limb Movement Disorder (PLMD) evolved after CPAP was initiated, NREM sleep only.  4. PVCs improved under CPAP.     RECOMMENDATIONS:  1. Severe sleep apnea responded ioncomplete to CPAP, as did Hypoxemia. Advise to start auto CPAP at 13 cm water with EPR of 3 cmH2O and follow clinical symptomatology.   2. A nasal pillow, AirFit P10 in large size, was used with heated humidity during this study.  Advise to add heated humidity.  Adjust interface and heated humidity as needed.     3. Compliance to PAP therapy should be emphasized.  Compliance, AHI and air leak information to be downloaded for objective assessment at 30  days, 180 days and annually thereafter.   4. There were frequent periodic limb movements of sleep (PLMS) with associated sleep disruption.  Consider treating the PLMS primarily.  Consider treating the PLMS if they fail to resolve with PAP therapy or if there is a compelling history of restless legs syndrome or periodic limb movement disorder.     5. Further information regarding OSA may be obtained from [employer] (www.sleepfoundation.org) or American Sleep Apnea Association (www.sleepapnea.org). 6. Correlate clinically for a history consistent with regarding restless legs syndrome (RLS).    Consider secondary restless legs syndrome.  Pharmacotherapy may be warranted.  Obtain a serum ferritin level if the clinical history is consistent with RLS.  Consider iron therapy and evaluation for iron deficiency anemia if the serum ferritin level < 50 ng/ml.  Certain medications or substances may aggravate RLS and common offenders may include the following:  nicotine, caffeine, SSRIs, TCAs, phenothiazine, dopamine antagonists, diphenhydramine,  and alcohol.   7. A follow up appointment will be scheduled in the Sleep Clinic at [facility].      I certify that I have reviewed the entire raw data recording prior to the issuance of this report in accordance with the Standards of Accreditation of the American Academy of Sleep Medicine (AASM)      Larey Seat, M.D.      11-15-2016 Diplomat, American Board of Psychiatry and Neurology  Diplomat, [place] of Sleep Medicine Medical Director, [place] [facility] [employer]

## 2016-11-24 DIAGNOSIS — G4733 Obstructive sleep apnea (adult) (pediatric): Secondary | ICD-10-CM | POA: Diagnosis not present

## 2016-11-27 DIAGNOSIS — H3562 Retinal hemorrhage, left eye: Secondary | ICD-10-CM | POA: Diagnosis not present

## 2016-12-21 ENCOUNTER — Telehealth: Payer: Self-pay | Admitting: Neurology

## 2016-12-21 NOTE — Telephone Encounter (Signed)
Called the pt to make him aware that the provider will not be available on jan 10,2019 for his initial cpap f/u. I have requested the patient call back and we get him rescheduled with either MD or NP anywhere between 12/25/2016- 03/24/2017.   No answer left voicemail for the patient. Please reschedule him during that time frame when he calls back and remove him from the jan 10th apt.

## 2016-12-24 DIAGNOSIS — G4733 Obstructive sleep apnea (adult) (pediatric): Secondary | ICD-10-CM | POA: Diagnosis not present

## 2017-01-18 ENCOUNTER — Ambulatory Visit: Payer: Self-pay

## 2017-01-24 DIAGNOSIS — G4733 Obstructive sleep apnea (adult) (pediatric): Secondary | ICD-10-CM | POA: Diagnosis not present

## 2017-02-24 DIAGNOSIS — G4733 Obstructive sleep apnea (adult) (pediatric): Secondary | ICD-10-CM | POA: Diagnosis not present

## 2017-02-26 DIAGNOSIS — H348322 Tributary (branch) retinal vein occlusion, left eye, stable: Secondary | ICD-10-CM | POA: Diagnosis not present

## 2017-03-06 DIAGNOSIS — I10 Essential (primary) hypertension: Secondary | ICD-10-CM | POA: Diagnosis not present

## 2017-03-06 DIAGNOSIS — Z131 Encounter for screening for diabetes mellitus: Secondary | ICD-10-CM | POA: Diagnosis not present

## 2017-03-06 DIAGNOSIS — Z125 Encounter for screening for malignant neoplasm of prostate: Secondary | ICD-10-CM | POA: Diagnosis not present

## 2017-03-06 DIAGNOSIS — Z0000 Encounter for general adult medical examination without abnormal findings: Secondary | ICD-10-CM | POA: Diagnosis not present

## 2017-03-06 DIAGNOSIS — M1A0790 Idiopathic chronic gout, unspecified ankle and foot, without tophus (tophi): Secondary | ICD-10-CM | POA: Diagnosis not present

## 2017-03-06 DIAGNOSIS — E782 Mixed hyperlipidemia: Secondary | ICD-10-CM | POA: Diagnosis not present

## 2017-03-15 ENCOUNTER — Encounter: Payer: Self-pay | Admitting: Neurology

## 2017-03-16 DIAGNOSIS — M1A0790 Idiopathic chronic gout, unspecified ankle and foot, without tophus (tophi): Secondary | ICD-10-CM | POA: Diagnosis not present

## 2017-03-16 DIAGNOSIS — R319 Hematuria, unspecified: Secondary | ICD-10-CM | POA: Diagnosis not present

## 2017-03-16 DIAGNOSIS — Z0000 Encounter for general adult medical examination without abnormal findings: Secondary | ICD-10-CM | POA: Diagnosis not present

## 2017-03-16 DIAGNOSIS — N400 Enlarged prostate without lower urinary tract symptoms: Secondary | ICD-10-CM | POA: Diagnosis not present

## 2017-03-19 ENCOUNTER — Ambulatory Visit (INDEPENDENT_AMBULATORY_CARE_PROVIDER_SITE_OTHER): Payer: BLUE CROSS/BLUE SHIELD | Admitting: Neurology

## 2017-03-19 ENCOUNTER — Encounter: Payer: Self-pay | Admitting: Neurology

## 2017-03-19 VITALS — BP 138/88 | HR 76 | Ht 74.0 in | Wt 288.0 lb

## 2017-03-19 DIAGNOSIS — G4733 Obstructive sleep apnea (adult) (pediatric): Secondary | ICD-10-CM | POA: Diagnosis not present

## 2017-03-19 DIAGNOSIS — Z9989 Dependence on other enabling machines and devices: Secondary | ICD-10-CM

## 2017-03-19 DIAGNOSIS — Z6841 Body Mass Index (BMI) 40.0 and over, adult: Secondary | ICD-10-CM

## 2017-03-19 NOTE — Patient Instructions (Signed)
Travel CPAP - purchase the same brand to allow all data to flow together.

## 2017-03-19 NOTE — Progress Notes (Addendum)
SLEEP MEDICINE CLINIC   Provider:  Larey Seat, [place] D  Primary Care Physician:  Merrilee Seashore, MD   Referring Provider: Merrilee Seashore, MD    Chief Complaint  Patient presents with  . New Patient (Initial Visit)    pt alone, rm 10, pt states that the CPAP is working well. DME [facility]. pt is interested in purchasing a travel cpap. would need a script to get that.     HPI:  Gary [name] is a 62 y.o. male , seen here as in a referral  from Dr. Ashby Dawes for Evaluation of possible sleep apnea.  Gary [name] explained that he had undergone a colonoscopy in September of this year with Dr. Narda Amber, witnessed apneas but he was sedated and recovering from the procedure. He had been reluctant to undergo a new evaluation for possible sleep apnea as he had a polysomnography over 10 years ago, was placed on CPAP, which he could not tolerate. Dr. Ashby Dawes initiated there for this referral. He is also concerned about the patient's BMI which is 39, and has CAD with high cholesterol levels.He also suffers from allergic rhinitis, has become a habitual mouth breather and has seasonally suffered from bronchitis. In the meantime,since the last PSG was performed, the patient had developed coronary artery disease and underwent 6 bypass procedures.  Sleep habits are as follows:The patient usually watches TV before going to bed, often falling asleep in the den while watching. He sleeps in a recliner until 4 or 5 AM some days then transfers to his bedroom and proper bed. He wakes up often around 4 AM either to transfer from the den to the bedroom door wakes up in his bedroom and has sometimes trouble to fall asleep again. He rises usually at 6 AM, and his average sleep varies between 4 and 8 hours. He usually has one nocturia. Some nights he has one scotch before going to bed.He did not indicate a favored sleep position, sleeps on one pillow, the bedroom is cool, quiet and dark. He shares a bedroom  with his wife.  Sleep medical history and family sleep history: The patient does not recall having ever slept walked or having at night terrors in childhood,He has developed insomnia, suffering more from an inability to sustain sleep than to initiate sleep. He also doesn't feel always refreshed and restored in the morning he has been witnessed to snore, has often a dry mouth and excessive daytime sleepiness. He endorsed STOP BANG questionnaire at 9 points, Social history: The patient is married with one son. He works in Press photographer, Transport planner, Therapist, art, travels often by car, this office has windows he is a former smoker he drinks 5-7 drinks a week not more rub or less, he does not consume caffeine neither in form of energy drinks no coffee, nor tea, nor soda.   Interval history  03-19-2017 , I have the pleasure of meeting with Gary [name] today following his sleep study from November 11, 2016.  Dr. Ashby Dawes had referred him, the patient had presented with an Epworth sleepiness score of 7 points, BMI of 37 and a history of sleep apnea but could not tolerate CPAP about a decade ago.  The sleep study confirmed the presence of rather severe apnea with an AHI of 87.4/h during REM sleep his apnea exacerbated 120/h and in return the patient had very little REM sleep noted.  He spent a total of 63 minutes in desaturation with a nadir of SPO2 being 77% he had  also frequent premature ventricular contractions on his EKG.  The patient was then titrated to CPAP starting at 5 and ending at 12 cmH2O but there was a reduction of the apnea to 4/h.  Interesting is that REM sleep rebound it immediately when CPAP was applied and for the second half of the night 26.3% were spent in rem sleep.  He also did have some periodic limb movements which I assumed for a sign of not being used to CPAP yet.  I would not have expected the limb movements to continue once the patient gets used to the machine.  He is using an air fit P 10  and large size.  Download for the last 30 days including 15 March 2017 documented 100% compliance for days 28 days or 93% of the use of time with 4 consecutive hours or more, average use of time 7 hours 27 minutes, CPAP is currently used at 13 cmH2O was 1 cm EPR and the patient presents with an AHI of 1.5.  This is an excellent resolution.  He does have some central apneas emerging but these are not concerning at a frequency of 0.4/h of sleep.  There has been a higher air leak in the second half of February and in the early user documentation, the pressure seems to be set every night to the same and his AHI has been stable.  I would not need to make any adjustments but will explained to the patient that it is important to get regular new interfaces, and we will talk a little bit about how to keep the CPAP clean and which supplies provided how often.   Review of Systems: Out of a complete 14 system review, the patient complains of only the following symptoms, and all other reviewed systems are negative. Endorsed the Epworth sleepiness score at 7 out of 24 points, the fatigue severity score at 9 out of 63 points, no indication of depression. Sleepiness and snoring.     Social History   Socioeconomic History  . Marital status: Married    Spouse name: Not on file  . Number of children: Not on file  . Years of education: Not on file  . Highest education level: Not on file  Social Needs  . Financial resource strain: Not on file  . Food insecurity - worry: Not on file  . Food insecurity - inability: Not on file  . Transportation needs - medical: Not on file  . Transportation needs - non-medical: Not on file  Occupational History  . Not on file  Tobacco Use  . Smoking status: Former Smoker    Years: 25.00    Last attempt to quit: 05/13/1994    Years since quitting: 22.8  . Smokeless tobacco: Never Used  Substance and Sexual Activity  . Alcohol use: Yes    Comment: occasional  . Drug use: No   . Sexual activity: Yes  Other Topics Concern  . Not on file  Social History Narrative   ** Merged History Encounter **        Family History  Problem Relation Age of Onset  . Heart attack Father   . Pancreatic cancer Father   . Melanoma Brother     Past Medical History:  Diagnosis Date  . CAD (coronary artery disease)   . Essential hypertension   . Gout   . Kidney stones   . Obesity (BMI 30-39.9)   . S/P CABG x 6   . Sleep apnea  unable to use c-pap    Past Surgical History:  Procedure Laterality Date  . CORONARY ARTERY BYPASS GRAFT N/A 04/18/2013   Procedure: CORONARY ARTERY BYPASS GRAFTING (CABG) TIMES SIX USING LEFT INTERNAL MAMMARY ARTERY AND RIGHT SAPHENOUS LEG VEIN HARVESTED ENDOSCOPICALLY;  Surgeon: [name], MD;  Location: [place];  Service: Open Heart Surgery;  Laterality: N/A;  . INTRAOPERATIVE TRANSESOPHAGEAL ECHOCARDIOGRAM N/A 04/18/2013   Procedure: INTRAOPERATIVE TRANSESOPHAGEAL ECHOCARDIOGRAM;  Surgeon: [name], MD;  Location: [place];  Service: Open Heart Surgery;  Laterality: N/A;  . KIDNEY STONE SURGERY    . LEFT AND RIGHT HEART CATHETERIZATION WITH CORONARY ANGIOGRAM N/A 04/16/2013   Procedure: LEFT AND RIGHT HEART CATHETERIZATION WITH CORONARY ANGIOGRAM;  Surgeon: Jacolyn Reedy, MD;  Location: [facility] CATH LAB;  Service: Cardiovascular;  Laterality: N/A;  . NEPHROLITHOTOMY Right 05/28/2014   Procedure: NEPHROLITHOTOMY PERCUTANEOUS;  Surgeon: Franchot Gallo, MD;  Location: [facility];  Service: Urology;  Laterality: Right;    Current Outpatient Medications  Medication Sig Dispense Refill  . allopurinol (ZYLOPRIM) 100 MG tablet Take 100 mg by mouth daily.    Marland Kitchen allopurinol (ZYLOPRIM) 300 MG tablet Take 300 mg by mouth daily. Take 300 mg along with 100 mg to equal 400 mg    . Ascorbic Acid (VITAMIN C) 1000 MG tablet Take 2,000 mg by mouth every morning.     Marland Kitchen atorvastatin (LIPITOR) 10 MG tablet Take 1 tablet (10 mg total) by mouth daily at 6 PM.  30 tablet 1  . diphenhydrAMINE (SOMINEX) 25 MG tablet Take 50 mg by mouth at bedtime as needed for sleep.    . mometasone (NASONEX) 50 MCG/ACT nasal spray Place 2 sprays into the nose daily. 17 g 12  . Multiple Vitamins-Minerals (CENTRUM SILVER PO) Take 1 tablet by mouth every morning.     . naproxen sodium (ANAPROX) 220 MG tablet Take 440 mg by mouth 2 (two) times daily as needed (pain.).    Marland Kitchen oxyCODONE (OXY IR/ROXICODONE) 5 MG immediate release tablet Take 1 tablet (5 mg total) by mouth every 4 (four) hours as needed for moderate pain. 30 tablet 0  . Polyvinyl Alcohol-Povidone (REFRESH OP) Place 1 drop into both eyes 2 (two) times daily as needed (dry eyes).    . tamsulosin (FLOMAX) 0.4 MG CAPS capsule Take 0.4 mg by mouth every morning.     No current facility-administered medications for this visit.     Allergies as of 03/19/2017 - Review Complete 03/19/2017  Allergen Reaction Noted  . Contrast media [iodinated diagnostic agents] Hives, Shortness Of Breath, and Nausea And Vomiting 04/16/2013  . Contrast media [iodinated diagnostic agents]  04/15/2013    Vitals: BP 138/88   Pulse 76   Ht 6\' 2"  (1.88 m)   Wt 288 lb (130.6 kg)   BMI 36.98 kg/m  Last Weight:  Wt Readings from Last 1 Encounters:  03/19/17 288 lb (130.6 kg)   DGL:OVFI mass index is 36.98 kg/m.     Last Height:   Ht Readings from Last 1 Encounters:  03/19/17 6\' 2"  (1.88 m)    Physical exam:  General: The patient is awake, alert and appears not in acute distress. The patient is truncally obese.  Head: Normocephalic, atraumatic. Neck is supple. Mallampati 4,  Facial hair- full beard . neck circumference: 21. Nasal airflow congested , slight nasal septal deviation,   Cardiovascular:  Regular rate and rhythm, without  murmurs or carotid bruit, and without distended neck veins. Respiratory: Lungs are clear  to auscultation. Skin:  Without evidence of edema, or rash Trunk: BMI is 36.92. - down by 2 points. The  patient's posture is erect.   Neurologic exam : Cranial nerves: Pupils are equal and briskly reactive to light.Visual fields by finger perimetry are intact. Hearing to finger rub intact. Facial sensation intact to fine touch. Facial motor strength is symmetric and tongue and uvula move midline. Shoulder shrug was symmetrical.  Motor exam:  Normal tone, muscle bulk and symmetric strength in all extremities. Sensory:  Fine touch, pinprick and vibration were normal. Coordination: Rapid alternating movements in the fingers/hands was normal. Finger-to-nose maneuverwithout evidence of ataxia, dysmetria or tremor. Gait and station: Patient walks without assistive device.Tandem gait deferred - Deep tendon reflexes: in the  upper and lower extremities are symmetric and intact. Babinski maneuver response is  downgoing.  Assessment:  After physical and neurologic examination, review of laboratory studies,  Personal review of imaging studies, reports of other /same  Imaging studies, results of polysomnography and / or neurophysiology testing and pre-existing records.  I reviewed dr Mathis Fare notes and appreciate his referral.   1) [facility]. DME- he is happy with CPAP and got used to it immediately.  He is less sleepy and sees the benefit, he dreams again - for the first time. He no longer tosses and turns, rests in one place, one position.   Gary [name] - a morbidly obese Caucasian gentleman at age 5 with a history of significant coronary artery disease and bypass surgery, hypercholesterolemia and witnessed snoring and apnea after sedation for colonoscopy. In contrast to these objective findings his subjective report is rather low rating of his subjective complaints, and showing neither excessive daytime sleepiness nor high degree of fatigue and  no other symptoms are mentioned, not even gouty arthritis or kidney stones.  He had severe OSA with AHI 80 and is done to 1.5 on CPAP. He still has rhinitis.  The nasal pillow has sometimes irritated the nostril.  He has had some epistaxis.  He will purchase a travel CPAP online and follow with [facility].   The patient was advised of the nature of the diagnosed disorder , the treatment options and the  risks for general health and wellness arising from not treating the condition.   I spent more than 20 minutes of face to face time with the patient. Greater than 50% of time was spent in counseling and coordination of care. We have discussed the diagnosis and differential and I answered the patient's questions.    [facility] to send supplies.   RV in 65 month with MD or NP, yearly   Larey Seat, MD 04/11/4740, 59:56 AM  Certified in Neurology by ABPN Certified in [place] by [facility] [address], [place],  [address]

## 2017-03-24 DIAGNOSIS — G4733 Obstructive sleep apnea (adult) (pediatric): Secondary | ICD-10-CM | POA: Diagnosis not present

## 2017-04-02 DIAGNOSIS — E785 Hyperlipidemia, unspecified: Secondary | ICD-10-CM | POA: Diagnosis not present

## 2017-04-02 DIAGNOSIS — I119 Hypertensive heart disease without heart failure: Secondary | ICD-10-CM | POA: Diagnosis not present

## 2017-04-02 DIAGNOSIS — I2510 Atherosclerotic heart disease of native coronary artery without angina pectoris: Secondary | ICD-10-CM | POA: Diagnosis not present

## 2017-04-02 DIAGNOSIS — E668 Other obesity: Secondary | ICD-10-CM | POA: Diagnosis not present

## 2017-04-24 DIAGNOSIS — G4733 Obstructive sleep apnea (adult) (pediatric): Secondary | ICD-10-CM | POA: Diagnosis not present

## 2017-05-15 DIAGNOSIS — G4733 Obstructive sleep apnea (adult) (pediatric): Secondary | ICD-10-CM | POA: Diagnosis not present

## 2017-05-24 DIAGNOSIS — G4733 Obstructive sleep apnea (adult) (pediatric): Secondary | ICD-10-CM | POA: Diagnosis not present

## 2017-05-28 DIAGNOSIS — H349 Unspecified retinal vascular occlusion: Secondary | ICD-10-CM | POA: Diagnosis not present

## 2017-05-31 DIAGNOSIS — H35033 Hypertensive retinopathy, bilateral: Secondary | ICD-10-CM | POA: Diagnosis not present

## 2017-05-31 DIAGNOSIS — H43813 Vitreous degeneration, bilateral: Secondary | ICD-10-CM | POA: Diagnosis not present

## 2017-05-31 DIAGNOSIS — H33322 Round hole, left eye: Secondary | ICD-10-CM | POA: Diagnosis not present

## 2017-05-31 DIAGNOSIS — H34832 Tributary (branch) retinal vein occlusion, left eye, with macular edema: Secondary | ICD-10-CM | POA: Diagnosis not present

## 2017-06-28 DIAGNOSIS — H34832 Tributary (branch) retinal vein occlusion, left eye, with macular edema: Secondary | ICD-10-CM | POA: Diagnosis not present

## 2017-06-28 DIAGNOSIS — H35033 Hypertensive retinopathy, bilateral: Secondary | ICD-10-CM | POA: Diagnosis not present

## 2017-06-28 DIAGNOSIS — H43813 Vitreous degeneration, bilateral: Secondary | ICD-10-CM | POA: Diagnosis not present

## 2017-06-28 DIAGNOSIS — H33322 Round hole, left eye: Secondary | ICD-10-CM | POA: Diagnosis not present

## 2017-07-30 DIAGNOSIS — G4733 Obstructive sleep apnea (adult) (pediatric): Secondary | ICD-10-CM | POA: Diagnosis not present

## 2017-08-02 DIAGNOSIS — H43393 Other vitreous opacities, bilateral: Secondary | ICD-10-CM | POA: Diagnosis not present

## 2017-08-02 DIAGNOSIS — H43813 Vitreous degeneration, bilateral: Secondary | ICD-10-CM | POA: Diagnosis not present

## 2017-08-02 DIAGNOSIS — H34832 Tributary (branch) retinal vein occlusion, left eye, with macular edema: Secondary | ICD-10-CM | POA: Diagnosis not present

## 2017-08-02 DIAGNOSIS — H35033 Hypertensive retinopathy, bilateral: Secondary | ICD-10-CM | POA: Diagnosis not present

## 2017-08-30 DIAGNOSIS — G4733 Obstructive sleep apnea (adult) (pediatric): Secondary | ICD-10-CM | POA: Diagnosis not present

## 2017-09-06 DIAGNOSIS — H43393 Other vitreous opacities, bilateral: Secondary | ICD-10-CM | POA: Diagnosis not present

## 2017-09-06 DIAGNOSIS — H43813 Vitreous degeneration, bilateral: Secondary | ICD-10-CM | POA: Diagnosis not present

## 2017-09-06 DIAGNOSIS — H35033 Hypertensive retinopathy, bilateral: Secondary | ICD-10-CM | POA: Diagnosis not present

## 2017-09-06 DIAGNOSIS — H34832 Tributary (branch) retinal vein occlusion, left eye, with macular edema: Secondary | ICD-10-CM | POA: Diagnosis not present

## 2017-09-20 ENCOUNTER — Telehealth: Payer: Self-pay | Admitting: Neurology

## 2017-09-20 NOTE — Telephone Encounter (Signed)
Pt rec'd a call to r/s appt with Hoyle Sauer on 03/25/18. Pt did not want to r/s the appt. Pt said he does not understand why he needs to be seen if he using the machine every night and there is not a problem. Please call to advise

## 2017-09-20 NOTE — Telephone Encounter (Signed)
Called the patient to inform him that Dr Brett Fairy states in last office note for the patient to follow up yearly. Advised the patient that its a insurance requirement to follow up yearly if being compliant to allow for the MD to just make sure the apnea being treated as well as show his compliance and for her to order supplies. Patient states he is pretty much paying out of pocket for everything anyways and that he doesn't understand why if everything is going well why its necessary. I advised that in order for the Dr Dohmeier to be able to order supplies for him we would have to see him yearly. Pt was upset by this. I did advise that he may be able to purchase supplies out of pocket but informed him that I just didn't want to tell him wrong. Informed him that he could follow up with [facility] and question that with them and see if they feel it would be necessary. I made sure to reiterate that if he is not seen here yearly then we can not order anything for him. Pt would like to address this with [facility] first before rescheduling.

## 2017-09-28 DIAGNOSIS — H5201 Hypermetropia, right eye: Secondary | ICD-10-CM | POA: Diagnosis not present

## 2017-09-28 DIAGNOSIS — E782 Mixed hyperlipidemia: Secondary | ICD-10-CM | POA: Diagnosis not present

## 2017-09-28 DIAGNOSIS — H2513 Age-related nuclear cataract, bilateral: Secondary | ICD-10-CM | POA: Diagnosis not present

## 2017-09-28 DIAGNOSIS — H5212 Myopia, left eye: Secondary | ICD-10-CM | POA: Diagnosis not present

## 2017-09-28 DIAGNOSIS — I2510 Atherosclerotic heart disease of native coronary artery without angina pectoris: Secondary | ICD-10-CM | POA: Diagnosis not present

## 2017-09-30 DIAGNOSIS — G4733 Obstructive sleep apnea (adult) (pediatric): Secondary | ICD-10-CM | POA: Diagnosis not present

## 2017-10-05 DIAGNOSIS — J301 Allergic rhinitis due to pollen: Secondary | ICD-10-CM | POA: Diagnosis not present

## 2017-10-05 DIAGNOSIS — M5412 Radiculopathy, cervical region: Secondary | ICD-10-CM | POA: Diagnosis not present

## 2017-10-05 DIAGNOSIS — Z23 Encounter for immunization: Secondary | ICD-10-CM | POA: Diagnosis not present

## 2017-10-05 DIAGNOSIS — M542 Cervicalgia: Secondary | ICD-10-CM | POA: Diagnosis not present

## 2017-10-18 DIAGNOSIS — H34832 Tributary (branch) retinal vein occlusion, left eye, with macular edema: Secondary | ICD-10-CM | POA: Diagnosis not present

## 2017-11-05 DIAGNOSIS — N200 Calculus of kidney: Secondary | ICD-10-CM | POA: Diagnosis not present

## 2017-11-05 DIAGNOSIS — N401 Enlarged prostate with lower urinary tract symptoms: Secondary | ICD-10-CM | POA: Diagnosis not present

## 2017-11-05 DIAGNOSIS — R351 Nocturia: Secondary | ICD-10-CM | POA: Diagnosis not present

## 2017-12-10 DIAGNOSIS — J0100 Acute maxillary sinusitis, unspecified: Secondary | ICD-10-CM | POA: Diagnosis not present

## 2017-12-10 DIAGNOSIS — I10 Essential (primary) hypertension: Secondary | ICD-10-CM | POA: Diagnosis not present

## 2017-12-10 DIAGNOSIS — E782 Mixed hyperlipidemia: Secondary | ICD-10-CM | POA: Diagnosis not present

## 2017-12-13 DIAGNOSIS — H34832 Tributary (branch) retinal vein occlusion, left eye, with macular edema: Secondary | ICD-10-CM | POA: Diagnosis not present

## 2017-12-13 DIAGNOSIS — H35033 Hypertensive retinopathy, bilateral: Secondary | ICD-10-CM | POA: Diagnosis not present

## 2017-12-13 DIAGNOSIS — H43813 Vitreous degeneration, bilateral: Secondary | ICD-10-CM | POA: Diagnosis not present

## 2017-12-21 DIAGNOSIS — G4733 Obstructive sleep apnea (adult) (pediatric): Secondary | ICD-10-CM | POA: Diagnosis not present

## 2018-01-29 DIAGNOSIS — I2510 Atherosclerotic heart disease of native coronary artery without angina pectoris: Secondary | ICD-10-CM | POA: Diagnosis not present

## 2018-01-29 DIAGNOSIS — J0100 Acute maxillary sinusitis, unspecified: Secondary | ICD-10-CM | POA: Diagnosis not present

## 2018-01-31 DIAGNOSIS — H33322 Round hole, left eye: Secondary | ICD-10-CM | POA: Diagnosis not present

## 2018-01-31 DIAGNOSIS — H43813 Vitreous degeneration, bilateral: Secondary | ICD-10-CM | POA: Diagnosis not present

## 2018-01-31 DIAGNOSIS — H35033 Hypertensive retinopathy, bilateral: Secondary | ICD-10-CM | POA: Diagnosis not present

## 2018-01-31 DIAGNOSIS — H34832 Tributary (branch) retinal vein occlusion, left eye, with macular edema: Secondary | ICD-10-CM | POA: Diagnosis not present

## 2018-02-26 ENCOUNTER — Encounter: Payer: Self-pay | Admitting: Cardiology

## 2018-03-11 NOTE — Progress Notes (Signed)
HPI: Follow-up coronary artery disease.  Previously followed by Dr. Wynonia Lawman.  Patient had coronary artery bypass and graft in April 2015 with LIMA to the LAD, saphenous vein graft to the diagonal, saphenous vein graft to the first and second marginal and saphenous vein graft to the acute marginal and distal circumflex. PreCABG dopplers showed 1-39 bilateral stenosis.  Echocardiogram May 2015 showed normal LV function, moderate left atrial enlargement, small pericardial effusion and mild right atrial enlargement.  Mild mitral regurgitation.  Monitor August 2017 showed sinus with PVCs.  Patient denies dyspnea on exertion, orthopnea, PND, pedal edema, chest pain or syncope.  Current Outpatient Medications  Medication Sig Dispense Refill  . allopurinol (ZYLOPRIM) 100 MG tablet Take 100 mg by mouth daily.    Marland Kitchen allopurinol (ZYLOPRIM) 300 MG tablet Take 300 mg by mouth daily. Take 300 mg along with 100 mg to equal 400 mg    . Ascorbic Acid (VITAMIN C) 1000 MG tablet Take 2,000 mg by mouth every morning.     Marland Kitchen atorvastatin (LIPITOR) 10 MG tablet Take 1 tablet (10 mg total) by mouth daily at 6 PM. 30 tablet 1  . diphenhydrAMINE (SOMINEX) 25 MG tablet Take 50 mg by mouth at bedtime as needed for sleep.    . meloxicam (MOBIC) 15 MG tablet Take 15 mg by mouth every other day.    . mometasone (NASONEX) 50 MCG/ACT nasal spray Place 2 sprays into the nose daily. 17 g 12  . Multiple Vitamins-Minerals (CENTRUM SILVER PO) Take 1 tablet by mouth every morning.     . Polyvinyl Alcohol-Povidone (REFRESH OP) Place 1 drop into both eyes 2 (two) times daily as needed (dry eyes).    . tamsulosin (FLOMAX) 0.4 MG CAPS capsule Take 0.4 mg by mouth every morning.     No current facility-administered medications for this visit.      Past Medical History:  Diagnosis Date  . CAD (coronary artery disease)   . Essential hypertension   . Gout   . Kidney stones   . Obesity (BMI 30-39.9)   . S/P CABG x 6   . Sleep  apnea    unable to use c-pap    Past Surgical History:  Procedure Laterality Date  . CORONARY ARTERY BYPASS GRAFT N/A 04/18/2013   Procedure: CORONARY ARTERY BYPASS GRAFTING (CABG) TIMES SIX USING LEFT INTERNAL MAMMARY ARTERY AND RIGHT SAPHENOUS LEG VEIN HARVESTED ENDOSCOPICALLY;  Surgeon: [name], MD;  Location: [place];  Service: Open Heart Surgery;  Laterality: N/A;  . INTRAOPERATIVE TRANSESOPHAGEAL ECHOCARDIOGRAM N/A 04/18/2013   Procedure: INTRAOPERATIVE TRANSESOPHAGEAL ECHOCARDIOGRAM;  Surgeon: [name], MD;  Location: [place];  Service: Open Heart Surgery;  Laterality: N/A;  . KIDNEY STONE SURGERY    . LEFT AND RIGHT HEART CATHETERIZATION WITH CORONARY ANGIOGRAM N/A 04/16/2013   Procedure: LEFT AND RIGHT HEART CATHETERIZATION WITH CORONARY ANGIOGRAM;  Surgeon: Jacolyn Reedy, MD;  [facility] CATH LAB;  Service: Cardiovascular;  Laterality: N/A;  . NEPHROLITHOTOMY Right 05/28/2014   Procedure: NEPHROLITHOTOMY PERCUTANEOUS;  Surgeon: Franchot Gallo, MD;  Location: [facility];  Service: Urology;  Laterality: Right;    Social History   Socioeconomic History  . Marital status: Married    Spouse name: Not on file  . Number of children: Not on file  . Years of education: Not on file  . Highest education level: Not on file  Occupational History  . Not on file  Social Needs  . Financial resource strain:  Not on file  . Food insecurity:    Worry: Not on file    Inability: Not on file  . Transportation needs:    Medical: Not on file    Non-medical: Not on file  Tobacco Use  . Smoking status: Former Smoker    Years: 25.00    Last attempt to quit: 05/13/1994    Years since quitting: 23.8  . Smokeless tobacco: Never Used  Substance and Sexual Activity  . Alcohol use: Yes    Comment: occasional  . Drug use: No  . Sexual activity: Yes  Lifestyle  . Physical activity:    Days per week: Not on file    Minutes per session: Not on file  . Stress: Not on file    Relationships  . Social connections:    Talks on phone: Not on file    Gets together: Not on file    Attends religious service: Not on file    Active member of club or organization: Not on file    Attends meetings of clubs or organizations: Not on file    Relationship status: Not on file  . Intimate partner violence:    Fear of current or ex partner: Not on file    Emotionally abused: Not on file    Physically abused: Not on file    Forced sexual activity: Not on file  Other Topics Concern  . Not on file  Social History Narrative   ** Merged History Encounter **        Family History  Problem Relation Age of Onset  . Heart attack Father   . Pancreatic cancer Father   . Melanoma Brother     ROS: no fevers or chills, productive cough, hemoptysis, dysphasia, odynophagia, melena, hematochezia, dysuria, hematuria, rash, seizure activity, orthopnea, PND, pedal edema, claudication. Remaining systems are negative.  Physical Exam: Well-developed well-nourished in no acute distress.  Skin is warm and dry.  HEENT is normal.  Neck is supple.  Chest is clear to auscultation with normal expansion.  Cardiovascular exam is regular rate and rhythm.  Abdominal exam nontender or distended. No masses palpated. Extremities show no edema. neuro grossly intact  ECG-sinus bradycardia at a rate of 59, no ST changes.  Personally reviewed  A/P  1 coronary artery disease-patient is status post coronary artery bypass and graft.  There is no dyspnea or chest pain.  Plan to continue medical therapy with aspirin and statin.  Note approximately 20 minutes spent reviewing records prior to patient arrival.  2 hyperlipidemia-patient's lipids and liver are monitored by primary care.  He is scheduled to have blood work done in the next 1 to 2 weeks.  We will have results forwarded to [place].  If liver functions normal I will increase Lipitor to 80 mg daily and then check lipids and liver 6 weeks later.  3  history of hypertension-blood pressure is controlled at present on no medications.  4 sleep apnea-continue CPAP.  Kirk Ruths, MD

## 2018-03-15 ENCOUNTER — Ambulatory Visit (INDEPENDENT_AMBULATORY_CARE_PROVIDER_SITE_OTHER): Payer: BLUE CROSS/BLUE SHIELD | Admitting: Cardiology

## 2018-03-15 ENCOUNTER — Encounter: Payer: Self-pay | Admitting: Cardiology

## 2018-03-15 VITALS — BP 128/86 | HR 59 | Ht 74.0 in | Wt 285.0 lb

## 2018-03-15 DIAGNOSIS — I2510 Atherosclerotic heart disease of native coronary artery without angina pectoris: Secondary | ICD-10-CM

## 2018-03-15 DIAGNOSIS — I10 Essential (primary) hypertension: Secondary | ICD-10-CM

## 2018-03-15 DIAGNOSIS — E7800 Pure hypercholesterolemia, unspecified: Secondary | ICD-10-CM | POA: Diagnosis not present

## 2018-03-15 NOTE — Patient Instructions (Signed)

## 2018-03-21 DIAGNOSIS — H35033 Hypertensive retinopathy, bilateral: Secondary | ICD-10-CM | POA: Diagnosis not present

## 2018-03-21 DIAGNOSIS — H43393 Other vitreous opacities, bilateral: Secondary | ICD-10-CM | POA: Diagnosis not present

## 2018-03-21 DIAGNOSIS — H34832 Tributary (branch) retinal vein occlusion, left eye, with macular edema: Secondary | ICD-10-CM | POA: Diagnosis not present

## 2018-03-21 DIAGNOSIS — H43813 Vitreous degeneration, bilateral: Secondary | ICD-10-CM | POA: Diagnosis not present

## 2018-03-25 ENCOUNTER — Ambulatory Visit: Payer: BLUE CROSS/BLUE SHIELD | Admitting: Nurse Practitioner

## 2018-03-25 DIAGNOSIS — M1A0790 Idiopathic chronic gout, unspecified ankle and foot, without tophus (tophi): Secondary | ICD-10-CM | POA: Diagnosis not present

## 2018-03-25 DIAGNOSIS — Z125 Encounter for screening for malignant neoplasm of prostate: Secondary | ICD-10-CM | POA: Diagnosis not present

## 2018-03-25 DIAGNOSIS — I10 Essential (primary) hypertension: Secondary | ICD-10-CM | POA: Diagnosis not present

## 2018-03-25 DIAGNOSIS — E782 Mixed hyperlipidemia: Secondary | ICD-10-CM | POA: Diagnosis not present

## 2018-04-01 DIAGNOSIS — I10 Essential (primary) hypertension: Secondary | ICD-10-CM | POA: Diagnosis not present

## 2018-04-01 DIAGNOSIS — I2510 Atherosclerotic heart disease of native coronary artery without angina pectoris: Secondary | ICD-10-CM | POA: Diagnosis not present

## 2018-04-01 DIAGNOSIS — Z0000 Encounter for general adult medical examination without abnormal findings: Secondary | ICD-10-CM | POA: Diagnosis not present

## 2018-04-01 DIAGNOSIS — E782 Mixed hyperlipidemia: Secondary | ICD-10-CM | POA: Diagnosis not present

## 2018-04-22 ENCOUNTER — Telehealth: Payer: Self-pay

## 2018-04-22 ENCOUNTER — Other Ambulatory Visit: Payer: Self-pay

## 2018-04-22 ENCOUNTER — Telehealth: Payer: Self-pay | Admitting: *Deleted

## 2018-04-22 DIAGNOSIS — E7800 Pure hypercholesterolemia, unspecified: Secondary | ICD-10-CM

## 2018-04-22 MED ORDER — ATORVASTATIN CALCIUM 10 MG PO TABS: 10.0000 mg | ORAL_TABLET | Freq: Every day | ORAL | 3 refills | Status: DC

## 2018-04-22 MED ORDER — ATORVASTATIN CALCIUM 80 MG PO TABS: 80.0000 mg | ORAL_TABLET | Freq: Every day | ORAL | 3 refills | Status: DC

## 2018-04-22 NOTE — Telephone Encounter (Signed)
-----   Message from Lelon Perla, MD sent at 04/22/2018  8:01 AM EDT ----- Cholesterol is good but given documented CAD should be on max lipitor; change to 80 mg daily; lipids and liver 6 weeks Kirk Ruths ----- Message ----- From: Cristopher Estimable, RN Sent: 04/22/2018   7:51 AM EDT To: Lelon Perla, MD

## 2018-04-22 NOTE — Telephone Encounter (Signed)
Spoke with pt, Aware of dr crenshaw's recommendations. New script sent to the pharmacy and Lab orders mailed to the pt  

## 2018-04-22 NOTE — Telephone Encounter (Signed)
Spoke to patient lipitor refill [facility].

## 2018-05-16 DIAGNOSIS — H34832 Tributary (branch) retinal vein occlusion, left eye, with macular edema: Secondary | ICD-10-CM | POA: Diagnosis not present

## 2018-06-11 DIAGNOSIS — E7800 Pure hypercholesterolemia, unspecified: Secondary | ICD-10-CM | POA: Diagnosis not present

## 2018-06-11 LAB — LIPID PANEL
Chol/HDL Ratio: 2.5 ratio (ref 0.0–5.0)
Cholesterol, Total: 94 mg/dL — ABNORMAL LOW (ref 100–199)
HDL: 37 mg/dL — ABNORMAL LOW (ref >39)
LDL Calculated: 40 mg/dL (ref 0–99)
Triglycerides: 85 mg/dL (ref 0–149)
VLDL Cholesterol Cal: 17 mg/dL (ref 5–40)

## 2018-06-11 LAB — HEPATIC FUNCTION PANEL
ALT: 28 IU/L (ref 0–44)
AST: 32 IU/L (ref 0–40)
Albumin: 4.5 g/dL (ref 3.8–4.8)
Alkaline Phosphatase: 73 IU/L (ref 39–117)
Bilirubin Total: 1.2 mg/dL (ref 0.0–1.2)
Bilirubin, Direct: 0.35 mg/dL (ref 0.00–0.40)
Total Protein: 6.6 g/dL (ref 6.0–8.5)

## 2018-06-25 DIAGNOSIS — L570 Actinic keratosis: Secondary | ICD-10-CM | POA: Diagnosis not present

## 2018-06-25 DIAGNOSIS — D485 Neoplasm of uncertain behavior of skin: Secondary | ICD-10-CM | POA: Diagnosis not present

## 2018-07-18 DIAGNOSIS — H34832 Tributary (branch) retinal vein occlusion, left eye, with macular edema: Secondary | ICD-10-CM | POA: Diagnosis not present

## 2018-07-18 DIAGNOSIS — H43393 Other vitreous opacities, bilateral: Secondary | ICD-10-CM | POA: Diagnosis not present

## 2018-07-18 DIAGNOSIS — H43813 Vitreous degeneration, bilateral: Secondary | ICD-10-CM | POA: Diagnosis not present

## 2018-07-18 DIAGNOSIS — H35033 Hypertensive retinopathy, bilateral: Secondary | ICD-10-CM | POA: Diagnosis not present

## 2018-09-19 DIAGNOSIS — Z23 Encounter for immunization: Secondary | ICD-10-CM | POA: Diagnosis not present

## 2018-09-26 DIAGNOSIS — H43393 Other vitreous opacities, bilateral: Secondary | ICD-10-CM | POA: Diagnosis not present

## 2018-09-26 DIAGNOSIS — H34832 Tributary (branch) retinal vein occlusion, left eye, with macular edema: Secondary | ICD-10-CM | POA: Diagnosis not present

## 2018-09-26 DIAGNOSIS — H35033 Hypertensive retinopathy, bilateral: Secondary | ICD-10-CM | POA: Diagnosis not present

## 2018-09-26 DIAGNOSIS — H43813 Vitreous degeneration, bilateral: Secondary | ICD-10-CM | POA: Diagnosis not present

## 2018-09-30 DIAGNOSIS — H33302 Unspecified retinal break, left eye: Secondary | ICD-10-CM | POA: Diagnosis not present

## 2018-09-30 DIAGNOSIS — H524 Presbyopia: Secondary | ICD-10-CM | POA: Diagnosis not present

## 2018-11-18 DIAGNOSIS — I2510 Atherosclerotic heart disease of native coronary artery without angina pectoris: Secondary | ICD-10-CM | POA: Diagnosis not present

## 2018-11-25 DIAGNOSIS — E782 Mixed hyperlipidemia: Secondary | ICD-10-CM | POA: Diagnosis not present

## 2018-11-25 DIAGNOSIS — I10 Essential (primary) hypertension: Secondary | ICD-10-CM | POA: Diagnosis not present

## 2018-11-25 DIAGNOSIS — I2510 Atherosclerotic heart disease of native coronary artery without angina pectoris: Secondary | ICD-10-CM | POA: Diagnosis not present

## 2018-11-25 DIAGNOSIS — K760 Fatty (change of) liver, not elsewhere classified: Secondary | ICD-10-CM | POA: Diagnosis not present

## 2018-11-25 DIAGNOSIS — Z23 Encounter for immunization: Secondary | ICD-10-CM | POA: Diagnosis not present

## 2018-12-02 ENCOUNTER — Other Ambulatory Visit: Payer: Self-pay

## 2018-12-02 DIAGNOSIS — Z20822 Contact with and (suspected) exposure to covid-19: Secondary | ICD-10-CM

## 2018-12-04 LAB — NOVEL CORONAVIRUS, NAA: SARS-CoV-2, NAA: NOT DETECTED

## 2018-12-19 DIAGNOSIS — H43813 Vitreous degeneration, bilateral: Secondary | ICD-10-CM | POA: Diagnosis not present

## 2018-12-19 DIAGNOSIS — H34832 Tributary (branch) retinal vein occlusion, left eye, with macular edema: Secondary | ICD-10-CM | POA: Diagnosis not present

## 2018-12-19 DIAGNOSIS — H35033 Hypertensive retinopathy, bilateral: Secondary | ICD-10-CM | POA: Diagnosis not present

## 2019-01-07 DIAGNOSIS — G4733 Obstructive sleep apnea (adult) (pediatric): Secondary | ICD-10-CM | POA: Diagnosis not present

## 2019-02-06 ENCOUNTER — Other Ambulatory Visit: Payer: Self-pay | Admitting: Cardiology

## 2019-02-06 DIAGNOSIS — E7800 Pure hypercholesterolemia, unspecified: Secondary | ICD-10-CM

## 2019-02-12 ENCOUNTER — Other Ambulatory Visit: Payer: Self-pay | Admitting: Cardiology

## 2019-02-12 DIAGNOSIS — E7800 Pure hypercholesterolemia, unspecified: Secondary | ICD-10-CM

## 2019-02-17 ENCOUNTER — Ambulatory Visit: Payer: BC Managed Care – PPO | Attending: Internal Medicine

## 2019-02-17 DIAGNOSIS — Z23 Encounter for immunization: Secondary | ICD-10-CM

## 2019-02-17 NOTE — Progress Notes (Signed)
   Covid-19 Vaccination Clinic  Name:  Aum Teer    MRN: [id number] DOB: 05/07/1955  02/17/2019  Mr. Outen was observed post Covid-19 immunization for 15 minutes without incidence. He was provided with Vaccine Information Sheet and instruction to access the V-Safe system.   Mr. Fossett was instructed to call 911 with any severe reactions post vaccine: Marland Kitchen Difficulty breathing  . Swelling of your face and throat  . A fast heartbeat  . A bad rash all over your body  . Dizziness and weakness    Immunizations Administered    Name Date Dose VIS Date Route   Pfizer COVID-19 Vaccine 02/17/2019  3:34 PM 0.3 mL 12/20/2018 Intramuscular   Manufacturer: [place]   Lot: [id number]   [place]: [id number]

## 2019-02-26 DIAGNOSIS — H34832 Tributary (branch) retinal vein occlusion, left eye, with macular edema: Secondary | ICD-10-CM | POA: Diagnosis not present

## 2019-02-26 DIAGNOSIS — H43811 Vitreous degeneration, right eye: Secondary | ICD-10-CM | POA: Diagnosis not present

## 2019-03-11 NOTE — Progress Notes (Signed)
HPI: Follow-up coronary artery disease.  Previously followed by Dr. Wynonia Lawman.  Patient had coronary artery bypass and graft in April 2015 with LIMA to the LAD, saphenous vein graft to the diagonal, saphenous vein graft to the first and second marginal and saphenous vein graft to the acute marginal and distal circumflex. PreCABG dopplers showed 1-39 bilateral stenosis.  Echocardiogram May 2015 showed normal LV function, moderate left atrial enlargement, small pericardial effusion and mild right atrial enlargement.  Mild mitral regurgitation.  Monitor August 2017 showed sinus with PVCs.  Since last seen, there is no dyspnea on exertion, orthopnea, PND, pedal edema, chest pain or syncope.  He does have occasional episodes of palpitations described as his heart racing for approximately 5 minutes.  He did have some atrial fibrillation following his bypass surgery.  Current Outpatient Medications  Medication Sig Dispense Refill  . allopurinol (ZYLOPRIM) 100 MG tablet Take 100 mg by mouth daily.    Marland Kitchen allopurinol (ZYLOPRIM) 300 MG tablet Take 300 mg by mouth daily. Take 300 mg along with 100 mg to equal 400 mg    . Ascorbic Acid (VITAMIN C) 1000 MG tablet Take 2,000 mg by mouth every morning.     Marland Kitchen atorvastatin (LIPITOR) 80 MG tablet Take 1 tablet (80 mg total) by mouth daily at 6 PM. 90 tablet 3  . diphenhydrAMINE (SOMINEX) 25 MG tablet Take 50 mg by mouth at bedtime as needed for sleep.    . meloxicam (MOBIC) 15 MG tablet Take 15 mg by mouth every other day.    . mometasone (NASONEX) 50 MCG/ACT nasal spray Place 2 sprays into the nose daily. 17 g 12  . Multiple Vitamins-Minerals (CENTRUM SILVER PO) Take 1 tablet by mouth every morning.     . Polyvinyl Alcohol-Povidone (REFRESH OP) Place 1 drop into both eyes 2 (two) times daily as needed (dry eyes).    . tamsulosin (FLOMAX) 0.4 MG CAPS capsule Take 0.4 mg by mouth every morning.     No current facility-administered medications for this visit.      Past Medical History:  Diagnosis Date  . CAD (coronary artery disease)   . Essential hypertension   . Gout   . Kidney stones   . Obesity (BMI 30-39.9)   . S/P CABG x 6   . Sleep apnea    unable to use c-pap    Past Surgical History:  Procedure Laterality Date  . CORONARY ARTERY BYPASS GRAFT N/A 04/18/2013   Procedure: CORONARY ARTERY BYPASS GRAFTING (CABG) TIMES SIX USING LEFT INTERNAL MAMMARY ARTERY AND RIGHT SAPHENOUS LEG VEIN HARVESTED ENDOSCOPICALLY;  Surgeon: [name], MD;  Location: [facility];  Service: Open Heart Surgery;  Laterality: N/A;  . INTRAOPERATIVE TRANSESOPHAGEAL ECHOCARDIOGRAM N/A 04/18/2013   Procedure: INTRAOPERATIVE TRANSESOPHAGEAL ECHOCARDIOGRAM;  Surgeon: [name], MD;  Location: [place];  Service: Open Heart Surgery;  Laterality: N/A;  . KIDNEY STONE SURGERY    . LEFT AND RIGHT HEART CATHETERIZATION WITH CORONARY ANGIOGRAM N/A 04/16/2013   Procedure: LEFT AND RIGHT HEART CATHETERIZATION WITH CORONARY ANGIOGRAM;  Surgeon: Jacolyn Reedy, MD;  Location[facility];  Service: Cardiovascular;  Laterality: N/A;  . NEPHROLITHOTOMY Right 05/28/2014   Procedure: NEPHROLITHOTOMY PERCUTANEOUS;  Surgeon: Franchot Gallo, MD;  Location: [facility];  Service: Urology;  Laterality: Right;    Social History   Socioeconomic History  . Marital status: Married    Spouse name: Not on file  . Number of children: Not on file  .  Years of education: Not on file  . Highest education level: Not on file  Occupational History  . Not on file  Tobacco Use  . Smoking status: Former Smoker    Years: 25.00    Quit date: 05/13/1994    Years since quitting: 24.8  . Smokeless tobacco: Never Used  Substance and Sexual Activity  . Alcohol use: Yes    Comment: occasional  . Drug use: No  . Sexual activity: Yes  Other Topics Concern  . Not on file  Social History Narrative   ** Merged History Encounter **       Social Determinants of Health   Financial Resource  Strain:   . Difficulty of Paying Living Expenses:   Food Insecurity:   . Worried About Charity fundraiser in the Last Year:   . Arboriculturist in the Last Year:   Transportation Needs:   . Film/video editor (Medical):   Marland Kitchen Lack of Transportation (Non-Medical):   Physical Activity:   . Days of Exercise per Week:   . Minutes of Exercise per Session:   Stress:   . Feeling of Stress :   Social Connections:   . Frequency of Communication with Friends and Family:   . Frequency of Social Gatherings with Friends and Family:   . Attends Religious Services:   . Active Member of Clubs or Organizations:   . Attends Archivist Meetings:   Marland Kitchen Marital Status:   Intimate Partner Violence:   . Fear of Current or Ex-Partner:   . Emotionally Abused:   Marland Kitchen Physically Abused:   . Sexually Abused:     Family History  Problem Relation Age of Onset  . Heart attack Father   . Pancreatic cancer Father   . Melanoma Brother     ROS: no fevers or chills, productive cough, hemoptysis, dysphasia, odynophagia, melena, hematochezia, dysuria, hematuria, rash, seizure activity, orthopnea, PND, pedal edema, claudication. Remaining systems are negative.  Physical Exam: Well-developed well-nourished in no acute distress.  Skin is warm and dry.  HEENT is normal.  Neck is supple.  Chest is clear to auscultation with normal expansion.  Cardiovascular exam is regular rate and rhythm.  Abdominal exam nontender or distended. No masses palpated. Extremities show no edema. neuro grossly intact  ECG-sinus rhythm with occasional PAC and PVC, no ST changes.  Personally reviewed  A/P  1 coronary artery disease-status post coronary artery bypass and graft.  Plan to continue medical therapy with aspirin and statin.  He is not having chest pain.  2 hypertension-patient's blood pressure is controlled on no medications.  Will follow.  3 hyperlipidemia-continue statin.  Check lipids and liver.  4 sleep  apnea-patient will continue on CPAP.  5 palpitations-etiology unclear.  They are intermittent and unpredictable.  I discussed options today including monitor, apple watch or alive core.  His symptoms are relatively infrequent and I therefore feel that monitor would not be a great option.  He will look into the apple watch or alive core and if we document atrial fibrillation he would need anticoagulation in the future.  Kirk Ruths, MD

## 2019-03-17 ENCOUNTER — Ambulatory Visit: Payer: BC Managed Care – PPO | Attending: Internal Medicine

## 2019-03-17 DIAGNOSIS — Z23 Encounter for immunization: Secondary | ICD-10-CM | POA: Insufficient documentation

## 2019-03-17 NOTE — Progress Notes (Signed)
   Covid-19 Vaccination Clinic  Name:  Gary [name]    MRN: [id number] DOB: [birth year]  03/17/2019  [name] was observed post Covid-19 immunization for 15 minutes without incident. He was provided with Vaccine Information Sheet and instruction to access the V-Safe system.   [name] was instructed to call 911 with any severe reactions post vaccine: Marland Kitchen Difficulty breathing  . Swelling of face and throat  . A fast heartbeat  . A bad rash all over body  . Dizziness and weakness   Immunizations Administered    Name Date Dose VIS Date Route   Pfizer COVID-19 Vaccine 03/17/2019  8:23 AM 0.3 mL 12/20/2018 Intramuscular   Manufacturer: [employer]   Lot: [id number]   [place]: [id number]

## 2019-03-20 ENCOUNTER — Ambulatory Visit (INDEPENDENT_AMBULATORY_CARE_PROVIDER_SITE_OTHER): Payer: BC Managed Care – PPO | Admitting: Cardiology

## 2019-03-20 ENCOUNTER — Encounter: Payer: Self-pay | Admitting: Cardiology

## 2019-03-20 ENCOUNTER — Other Ambulatory Visit: Payer: Self-pay

## 2019-03-20 VITALS — BP 122/84 | HR 63 | Temp 96.8°F | Ht 74.0 in | Wt 272.0 lb

## 2019-03-20 DIAGNOSIS — I2510 Atherosclerotic heart disease of native coronary artery without angina pectoris: Secondary | ICD-10-CM | POA: Diagnosis not present

## 2019-03-20 DIAGNOSIS — I10 Essential (primary) hypertension: Secondary | ICD-10-CM

## 2019-03-20 DIAGNOSIS — E7800 Pure hypercholesterolemia, unspecified: Secondary | ICD-10-CM

## 2019-03-20 LAB — HEPATIC FUNCTION PANEL
ALT: 28 IU/L (ref 0–44)
AST: 46 IU/L — ABNORMAL HIGH (ref 0–40)
Albumin: 4.9 g/dL — ABNORMAL HIGH (ref 3.8–4.8)
Alkaline Phosphatase: 80 IU/L (ref 39–117)
Bilirubin Total: 1.2 mg/dL (ref 0.0–1.2)
Bilirubin, Direct: 0.38 mg/dL (ref 0.00–0.40)
Total Protein: 6.9 g/dL (ref 6.0–8.5)

## 2019-03-20 LAB — LIPID PANEL
Chol/HDL Ratio: 2 ratio (ref 0.0–5.0)
Cholesterol, Total: 103 mg/dL (ref 100–199)
HDL: 51 mg/dL (ref >39)
LDL Chol Calc (NIH): 38 mg/dL (ref 0–99)
Triglycerides: 62 mg/dL (ref 0–149)
VLDL Cholesterol Cal: 14 mg/dL (ref 5–40)

## 2019-03-20 NOTE — Patient Instructions (Signed)
Medication Instructions:  NO CHANGE *If you need a refill on your cardiac medications before your next appointment, please call your pharmacy*   Lab Work: Your physician recommends that you HAVE LAB WORK TODAY  If you have labs (blood work) drawn today and your tests are completely normal, you will receive your results only by: . MyChart Message (if you have MyChart) OR . A paper copy in the mail If you have any lab test that is abnormal or we need to change your treatment, we will call you to review the results.   Follow-Up: At [facility], you and your health needs are our priority.  As part of our continuing mission to provide you with exceptional heart care, we have created designated Provider Care Teams.  These Care Teams include your primary Cardiologist (physician) and Advanced Practice Providers (APPs -  Physician Assistants and Nurse Practitioners) who all work together to provide you with the care you need, when you need it.  We recommend signing up for the patient portal called "MyChart".  Sign up information is provided on this After Visit Summary.  MyChart is used to connect with patients for Virtual Visits (Telemedicine).  Patients are able to view lab/test results, encounter notes, upcoming appointments, etc.  Non-urgent messages can be sent to your provider as well.   To learn more about what you can do with MyChart, go to [email or web address].    Your next appointment:   6 month(s)  The format for your next appointment:   Either In Person or Virtual  Provider:   You may see BRIAN CRENSHAW MD or one of the following Advanced Practice Providers on your designated Care Team:    Luke Kilroy, PA-C  Callie Goodrich, PA-C  Jesse Cleaver, FNP      

## 2019-03-24 ENCOUNTER — Telehealth: Payer: Self-pay | Admitting: Cardiology

## 2019-03-24 NOTE — Telephone Encounter (Signed)
Pt called because he sent some recent EKGs to Dr. Stanford Breed via MyChart this morning but he has not gotten a response yet. He wanted to know if someone from the office would be able to look at them today and provide some feedback.

## 2019-03-24 NOTE — Telephone Encounter (Signed)
No significant arrhythmia (sinus with occasional PVC) Kirk Ruths

## 2019-03-24 NOTE — Telephone Encounter (Signed)
Returned call to patient left message on personal voice mail Dr.Crenshaw is not in office today.I will send message to him.

## 2019-03-24 NOTE — Telephone Encounter (Signed)
Spoke to patient Dr.Crenshaw's advice given. 

## 2019-03-25 ENCOUNTER — Other Ambulatory Visit (INDEPENDENT_AMBULATORY_CARE_PROVIDER_SITE_OTHER): Payer: BC Managed Care – PPO

## 2019-03-25 DIAGNOSIS — I10 Essential (primary) hypertension: Secondary | ICD-10-CM | POA: Diagnosis not present

## 2019-04-14 ENCOUNTER — Encounter: Payer: Self-pay | Admitting: Internal Medicine

## 2019-04-14 DIAGNOSIS — Z0000 Encounter for general adult medical examination without abnormal findings: Secondary | ICD-10-CM | POA: Diagnosis not present

## 2019-04-14 DIAGNOSIS — Z125 Encounter for screening for malignant neoplasm of prostate: Secondary | ICD-10-CM | POA: Diagnosis not present

## 2019-04-14 DIAGNOSIS — I10 Essential (primary) hypertension: Secondary | ICD-10-CM | POA: Diagnosis not present

## 2019-04-14 DIAGNOSIS — I2510 Atherosclerotic heart disease of native coronary artery without angina pectoris: Secondary | ICD-10-CM | POA: Diagnosis not present

## 2019-04-21 DIAGNOSIS — E782 Mixed hyperlipidemia: Secondary | ICD-10-CM | POA: Diagnosis not present

## 2019-04-21 DIAGNOSIS — I10 Essential (primary) hypertension: Secondary | ICD-10-CM | POA: Diagnosis not present

## 2019-04-21 DIAGNOSIS — I2510 Atherosclerotic heart disease of native coronary artery without angina pectoris: Secondary | ICD-10-CM | POA: Diagnosis not present

## 2019-04-21 DIAGNOSIS — Z0000 Encounter for general adult medical examination without abnormal findings: Secondary | ICD-10-CM | POA: Diagnosis not present

## 2019-04-21 DIAGNOSIS — Z23 Encounter for immunization: Secondary | ICD-10-CM | POA: Diagnosis not present

## 2019-05-07 DIAGNOSIS — H34832 Tributary (branch) retinal vein occlusion, left eye, with macular edema: Secondary | ICD-10-CM | POA: Diagnosis not present

## 2019-05-07 DIAGNOSIS — H43813 Vitreous degeneration, bilateral: Secondary | ICD-10-CM | POA: Diagnosis not present

## 2019-05-07 DIAGNOSIS — H35033 Hypertensive retinopathy, bilateral: Secondary | ICD-10-CM | POA: Diagnosis not present

## 2019-06-04 ENCOUNTER — Encounter: Payer: Self-pay | Admitting: *Deleted

## 2019-06-04 MED ORDER — APIXABAN 5 MG PO TABS
5.0000 mg | ORAL_TABLET | Freq: Two times a day (BID) | ORAL | 6 refills | Status: DC
Start: 2019-06-04 — End: 2019-12-19

## 2019-06-04 NOTE — Telephone Encounter (Signed)
Spoke with pt, Aware of dr Jacalyn Lefevre recommendations.  New script sent to the pharmacy  Follow up scheduled

## 2019-06-04 NOTE — Telephone Encounter (Signed)
Discontinue meloxicam and aspirin. Add apixaban 5 mg twice daily. Patient is in atrial fibrillation. Schedule APP office visit.  Kirk Ruths   Left message for pt to call

## 2019-06-04 NOTE — Telephone Encounter (Signed)
New message ° ° °Patient is returning your call. Please call. °

## 2019-06-04 NOTE — Telephone Encounter (Signed)
This encounter was created in error - please disregard.

## 2019-06-10 ENCOUNTER — Other Ambulatory Visit: Payer: Self-pay

## 2019-06-10 ENCOUNTER — Encounter: Payer: Self-pay | Admitting: Cardiology

## 2019-06-10 ENCOUNTER — Ambulatory Visit (INDEPENDENT_AMBULATORY_CARE_PROVIDER_SITE_OTHER): Payer: BC Managed Care – PPO | Admitting: Cardiology

## 2019-06-10 VITALS — BP 130/86 | HR 66 | Ht 74.0 in | Wt 268.4 lb

## 2019-06-10 DIAGNOSIS — E7800 Pure hypercholesterolemia, unspecified: Secondary | ICD-10-CM

## 2019-06-10 DIAGNOSIS — G4730 Sleep apnea, unspecified: Secondary | ICD-10-CM | POA: Insufficient documentation

## 2019-06-10 DIAGNOSIS — E785 Hyperlipidemia, unspecified: Secondary | ICD-10-CM

## 2019-06-10 DIAGNOSIS — I480 Paroxysmal atrial fibrillation: Secondary | ICD-10-CM

## 2019-06-10 DIAGNOSIS — I4891 Unspecified atrial fibrillation: Secondary | ICD-10-CM | POA: Diagnosis not present

## 2019-06-10 DIAGNOSIS — Z7901 Long term (current) use of anticoagulants: Secondary | ICD-10-CM | POA: Insufficient documentation

## 2019-06-10 DIAGNOSIS — I2510 Atherosclerotic heart disease of native coronary artery without angina pectoris: Secondary | ICD-10-CM | POA: Diagnosis not present

## 2019-06-10 DIAGNOSIS — I10 Essential (primary) hypertension: Secondary | ICD-10-CM

## 2019-06-10 DIAGNOSIS — Z6841 Body Mass Index (BMI) 40.0 and over, adult: Secondary | ICD-10-CM

## 2019-06-10 NOTE — Assessment & Plan Note (Signed)
Compliant with treatment 

## 2019-06-10 NOTE — Assessment & Plan Note (Signed)
He doesn't have HTN- I reviewed his B/P reading from home and he has only had two readings > A999333 systolic. He is not on any anti-hypertensive medication.

## 2019-06-10 NOTE — Assessment & Plan Note (Signed)
CHADS VASC=1 for vascular disease.  Eliquis added

## 2019-06-10 NOTE — Assessment & Plan Note (Signed)
LDL 37 Nov 2020

## 2019-06-10 NOTE — Assessment & Plan Note (Signed)
Post CABG-2015- treated with amiodarone and beta blocker which were later stopped. Recurrence documented on his Apple watch May 20121. He has mild, if any symptoms.  His rate in AF is not fast and baseline HR is slow-54 on no chronotropic agents.  No change in Rx for now unless he becomes more symptomatic.

## 2019-06-10 NOTE — Patient Instructions (Signed)
Medication Instructions:  Your physician recommends that you continue on your current medications as directed. Please refer to the Current Medication list given to you today.  *If you need a refill on your cardiac medications before your next appointment, please call your pharmacy*   Follow-Up: At [facility], you and your health needs are our priority.  As part of our continuing mission to provide you with exceptional heart care, we have created designated Provider Care Teams.  These Care Teams include your primary Cardiologist (physician) and Advanced Practice Providers (APPs -  Physician Assistants and Nurse Practitioners) who all work together to provide you with the care you need, when you need it.  We recommend signing up for the patient portal called "MyChart".  Sign up information is provided on this After Visit Summary.  MyChart is used to connect with patients for Virtual Visits (Telemedicine).  Patients are able to view lab/test results, encounter notes, upcoming appointments, etc.  Non-urgent messages can be sent to your provider as well.   To learn more about what you can do with MyChart, go to [email or web address].    Your next appointment:   3 month(s)  The format for your next appointment:   In Person  Provider:   Kirk Ruths, MD

## 2019-06-10 NOTE — Progress Notes (Signed)
Cardiology Office Note:    Date:  06/10/2019   ID:  Gary [name], DOB Jan 11, 1955, MRN [id number]  PCP:  Merrilee Seashore, MD  Cardiologist:  No primary care provider on file.  Electrophysiologist:  None   Referring MD: Merrilee Seashore, MD   No chief complaint on file.   History of Present Illness:    Gary [name] is a 64 y.o. male with a hx of CABG April 2015.  He is done well from a cardiac standpoint since.  Echocardiogram in May 2015 showed normal LV function.  He had palpitations in the past, a monitor done in 2017 showed PVCs.  He has sleep apnea and is compliant with treatment.  He was previously followed by Dr. Wynonia Lawman but is transitioning his care to Dr. Stanford Breed.  He saw Dr. Stanford Breed last March 20, 2019.  At that time he complained of palpitations and intermittent episodes of tachycardia.  These were infrequent.  Dr. Stanford Breed suggested an apple watch with a monitor.  Recently the patient's apple watch documented atrial fibrillation and he called.  His strips were reviewed and he was placed on Eliquis 5 mg twice daily.  His rates were particularly fast.  He was given an office visit today for follow-up.  As best I can tell Mr. Drue Flirt is mildly if at all symptomatic from his atrial fibrillation.  He is not had chest tightness like he had prior to his CABG.  He does note some dyspnea on exertion but it does not sound like this is a new change.  In the office today he is in sinus rhythm with a heart rate of 54.  He is on no rate control agents.  Past Medical History:  Diagnosis Date  . CAD (coronary artery disease)   . Essential hypertension   . Gout   . Kidney stones   . Obesity (BMI 30-39.9)   . S/P CABG x 6   . Sleep apnea    unable to use c-pap    Past Surgical History:  Procedure Laterality Date  . CORONARY ARTERY BYPASS GRAFT N/A 04/18/2013   Procedure: CORONARY ARTERY BYPASS GRAFTING (CABG) TIMES SIX USING LEFT INTERNAL MAMMARY ARTERY AND RIGHT SAPHENOUS LEG VEIN  HARVESTED ENDOSCOPICALLY;  Surgeon: [name], MD;  Location: [place];  Service: Open Heart Surgery;  Laterality: N/A;  . INTRAOPERATIVE TRANSESOPHAGEAL ECHOCARDIOGRAM N/A 04/18/2013   Procedure: INTRAOPERATIVE TRANSESOPHAGEAL ECHOCARDIOGRAM;  Surgeon: [name], MD;  Location: [place];  Service: Open Heart Surgery;  Laterality: N/A;  . KIDNEY STONE SURGERY    . LEFT AND RIGHT HEART CATHETERIZATION WITH CORONARY ANGIOGRAM N/A 04/16/2013   Procedure: LEFT AND RIGHT HEART CATHETERIZATION WITH CORONARY ANGIOGRAM;  Surgeon: Jacolyn Reedy, MD;  Location: [facility] CATH LAB;  Service: Cardiovascular;  Laterality: N/A;  . NEPHROLITHOTOMY Right 05/28/2014   Procedure: NEPHROLITHOTOMY PERCUTANEOUS;  Surgeon: Franchot Gallo, MD;  Location: [facility];  Service: Urology;  Laterality: Right;    Current Medications: Current Meds  Medication Sig  . allopurinol (ZYLOPRIM) 100 MG tablet Take 100 mg by mouth daily.  Marland Kitchen allopurinol (ZYLOPRIM) 300 MG tablet Take 300 mg by mouth daily. Take 300 mg along with 100 mg to equal 400 mg  . apixaban (ELIQUIS) 5 MG TABS tablet Take 1 tablet (5 mg total) by mouth 2 (two) times daily.  . Ascorbic Acid (VITAMIN C) 1000 MG tablet Take 2,000 mg by mouth every morning.   Marland Kitchen atorvastatin (LIPITOR) 80 MG tablet Take 1 tablet (80 mg total) by  mouth daily at 6 PM.  . diphenhydrAMINE (SOMINEX) 25 MG tablet Take 50 mg by mouth at bedtime as needed for sleep.  . mometasone (NASONEX) 50 MCG/ACT nasal spray Place 2 sprays into the nose daily.  . Multiple Vitamins-Minerals (CENTRUM SILVER PO) Take 1 tablet by mouth every morning.   . Polyvinyl Alcohol-Povidone (REFRESH OP) Place 1 drop into both eyes 2 (two) times daily as needed (dry eyes).  . tamsulosin (FLOMAX) 0.4 MG CAPS capsule Take 0.4 mg by mouth every morning.     Allergies:   Contrast media [iodinated diagnostic agents] and Contrast media [iodinated diagnostic agents]   Social History   Socioeconomic History  . Marital  status: Married    Spouse name: Not on file  . Number of children: Not on file  . Years of education: Not on file  . Highest education level: Not on file  Occupational History  . Not on file  Tobacco Use  . Smoking status: Former Smoker    Years: 25.00    Quit date: 05/13/1994    Years since quitting: 25.0  . Smokeless tobacco: Never Used  Substance and Sexual Activity  . Alcohol use: Yes    Comment: occasional  . Drug use: No  . Sexual activity: Yes  Other Topics Concern  . Not on file  Social History Narrative   ** Merged History Encounter **       Social Determinants of Health   Financial Resource Strain:   . Difficulty of Paying Living Expenses:   Food Insecurity:   . Worried About Charity fundraiser in the Last Year:   . Arboriculturist in the Last Year:   Transportation Needs:   . Film/video editor (Medical):   Marland Kitchen Lack of Transportation (Non-Medical):   Physical Activity:   . Days of Exercise per Week:   . Minutes of Exercise per Session:   Stress:   . Feeling of Stress :   Social Connections:   . Frequency of Communication with Friends and Family:   . Frequency of Social Gatherings with Friends and Family:   . Attends Religious Services:   . Active Member of Clubs or Organizations:   . Attends Archivist Meetings:   Marland Kitchen Marital Status:      Family History: The patient's family history includes Heart attack in his father; Melanoma in his brother; Pancreatic cancer in his father.  ROS:   Please see the history of present illness.  Pt has DJD and was on Meloxicam which helped.   This was stopped when Eliquis was added.   All other systems reviewed and are negative.  EKGs/Labs/Other Studies Reviewed:    The following studies were reviewed today: Echo 06/04/2013- Normal LVF, moderate LAE (4.8cm)  EKG:  EKG is ordered today.  The ekg ordered today demonstrates NSR- HR 54  Recent Labs: 03/20/2019: ALT 28  Recent Lipid Panel    Component  Value Date/Time   CHOL 103 03/20/2019 1036   TRIG 62 03/20/2019 1036   HDL 51 03/20/2019 1036   CHOLHDL 2.0 03/20/2019 1036   LDLCALC 38 03/20/2019 1036    Physical Exam:    VS:  BP 130/86   Pulse 66   Ht 6\' 2"  (1.88 m)   Wt 268 lb 6.4 oz (121.7 kg)   SpO2 99%   BMI 34.46 kg/m     Wt Readings from Last 3 Encounters:  06/10/19 268 lb 6.4 oz (121.7 kg)  03/20/19 272  lb (123.4 kg)  03/15/18 285 lb (129.3 kg)     GEN:  Well nourished, well developed in no acute distress HEENT: Normal NECK: No JVD; No carotid bruits CARDIAC: RRR, no murmurs, rubs, gallops RESPIRATORY:  Clear to auscultation without rales, wheezing or rhonchi  ABDOMEN: Soft, non-tender, non-distended MUSCULOSKELETAL:  No edema; No deformity  SKIN: Warm and dry NEUROLOGIC:  Alert and oriented x 3 PSYCHIATRIC:  Normal affect   ASSESSMENT:    PAF (paroxysmal atrial fibrillation) ([place]) Post CABG-2015- treated with amiodarone and beta blocker which were later stopped. Recurrence documented on his Apple watch May 20121. He has mild, if any symptoms.  His rate in AF is not fast and baseline HR is slow-54 on no chronotropic agents.  No change in Rx for now unless he becomes more symptomatic.  Chronic anticoagulation CHADS VASC=1 for vascular disease.  Eliquis added  Dyslipidemia, goal LDL below 70 LDL 37 Nov 2020  Essential hypertension He doesn't have HTN- I reviewed his B/P reading from home and he has only had two readings > A999333 systolic. He is not on any anti-hypertensive medication.   Sleep apnea Compliant with treatment-   PLAN:    No change in Rx- patient asked about aspirin and Eliquis and I told him I would ask Dr Stanford Breed.  We discussed low sodium diet.  He knows to let us know if he becomes symptomatic.  F/U Dr Stanford Breed in 3 months.    Medication Adjustments/Labs and Tests Ordered: Current medicines are reviewed at length with the patient today.  Concerns regarding medicines are outlined above.   Orders Placed This Encounter  Procedures  . EKG 12-Lead   No orders of the defined types were placed in this encounter.   Patient Instructions  Medication Instructions:  Your physician recommends that you continue on your current medications as directed. Please refer to the Current Medication list given to you today.  *If you need a refill on your cardiac medications before your next appointment, please call your pharmacy*   Follow-Up: At [facility], you and your health needs are our priority.  As part of our continuing mission to provide you with exceptional heart care, we have created designated Provider Care Teams.  These Care Teams include your primary Cardiologist (physician) and Advanced Practice Providers (APPs -  Physician Assistants and Nurse Practitioners) who all work together to provide you with the care you need, when you need it.  We recommend signing up for the patient portal called "MyChart".  Sign up information is provided on this After Visit Summary.  MyChart is used to connect with patients for Virtual Visits (Telemedicine).  Patients are able to view lab/test results, encounter notes, upcoming appointments, etc.  Non-urgent messages can be sent to your provider as well.   To learn more about what you can do with MyChart, go to [email or web address].    Your next appointment:   3 month(s)  The format for your next appointment:   In Person  Provider:   Kirk Ruths, MD     Signed, Kerin Ransom, PA-C  06/10/2019 1:03 PM    [place]

## 2019-06-11 NOTE — Progress Notes (Signed)
Can you let the patient know Dr Stanford Breed says he can stop his aspirin.  Kerin Ransom PA-C 06/11/2019 2:18 PM

## 2019-07-18 DIAGNOSIS — H34832 Tributary (branch) retinal vein occlusion, left eye, with macular edema: Secondary | ICD-10-CM | POA: Diagnosis not present

## 2019-07-23 DIAGNOSIS — Z23 Encounter for immunization: Secondary | ICD-10-CM | POA: Diagnosis not present

## 2019-09-10 NOTE — Progress Notes (Signed)
HPI: Follow-up coronary artery disease. Previously followed by Dr. Wynonia Lawman. Patient had coronary artery bypass and graft in April 2015 with LIMA to the LAD, saphenous vein graft to the diagonal, saphenous vein graft to the first and second marginal and saphenous vein graft to the acute marginal and distal circumflex. PreCABG dopplers showed 1-39 bilateral stenosis. Echocardiogram May 2015 showed normal LV function, moderate left atrial enlargement, small pericardial effusion and mild right atrial enlargement; mild mitral regurgitation. Monitor August 2017 showed sinus with PVCs.  Patient noted to have palpitations and rhythm strip on his apple watch showed atrial fibrillation.  Started on apixaban.  Since last seen,  there is no dyspnea, chest pain, palpitations or syncope.  No bleeding.  Current Outpatient Medications  Medication Sig Dispense Refill  . allopurinol (ZYLOPRIM) 100 MG tablet Take 100 mg by mouth daily.    Marland Kitchen allopurinol (ZYLOPRIM) 300 MG tablet Take 300 mg by mouth daily. Take 300 mg along with 100 mg to equal 400 mg    . apixaban (ELIQUIS) 5 MG TABS tablet Take 1 tablet (5 mg total) by mouth 2 (two) times daily. 60 tablet 6  . Ascorbic Acid (VITAMIN C) 1000 MG tablet Take 2,000 mg by mouth every morning.     Marland Kitchen atorvastatin (LIPITOR) 80 MG tablet Take 1 tablet (80 mg total) by mouth daily at 6 PM. 90 tablet 3  . diphenhydrAMINE (SOMINEX) 25 MG tablet Take 50 mg by mouth at bedtime as needed for sleep.    . mometasone (NASONEX) 50 MCG/ACT nasal spray Place 2 sprays into the nose daily. 17 g 12  . Multiple Vitamins-Minerals (CENTRUM SILVER PO) Take 1 tablet by mouth every morning.     . Polyvinyl Alcohol-Povidone (REFRESH OP) Place 1 drop into both eyes 2 (two) times daily as needed (dry eyes).    . tamsulosin (FLOMAX) 0.4 MG CAPS capsule Take 0.4 mg by mouth every morning.     No current facility-administered medications for this visit.     Past Medical History:    Diagnosis Date  . CAD (coronary artery disease)   . Essential hypertension   . Gout   . Kidney stones   . Obesity (BMI 30-39.9)   . S/P CABG x 6   . Sleep apnea    unable to use c-pap    Past Surgical History:  Procedure Laterality Date  . CORONARY ARTERY BYPASS GRAFT N/A 04/18/2013   Procedure: CORONARY ARTERY BYPASS GRAFTING (CABG) TIMES SIX USING LEFT INTERNAL MAMMARY ARTERY AND RIGHT SAPHENOUS LEG VEIN HARVESTED ENDOSCOPICALLY;  Surgeon: [name], MD;  Location: [place];  Service: Open Heart Surgery;  Laterality: N/A;  . INTRAOPERATIVE TRANSESOPHAGEAL ECHOCARDIOGRAM N/A 04/18/2013   Procedure: INTRAOPERATIVE TRANSESOPHAGEAL ECHOCARDIOGRAM;  Surgeon: [name], MD;  Location: [place];  Service: Open Heart Surgery;  Laterality: N/A;  . KIDNEY STONE SURGERY    . LEFT AND RIGHT HEART CATHETERIZATION WITH CORONARY ANGIOGRAM N/A 04/16/2013   Procedure: LEFT AND RIGHT HEART CATHETERIZATION WITH CORONARY ANGIOGRAM;  Surgeon: Jacolyn Reedy, MD;  Location: [facility] CATH LAB;  Service: Cardiovascular;  Laterality: N/A;  . NEPHROLITHOTOMY Right 05/28/2014   Procedure: NEPHROLITHOTOMY PERCUTANEOUS;  Surgeon: Franchot Gallo, MD;  Location: [facility];  Service: Urology;  Laterality: Right;    Social History   Socioeconomic History  . Marital status: Married    Spouse name: Not on file  . Number of children: Not on file  . Years of education: Not on file  .  Highest education level: Not on file  Occupational History  . Not on file  Tobacco Use  . Smoking status: Former Smoker    Years: 25.00    Quit date: 05/13/1994    Years since quitting: 25.3  . Smokeless tobacco: Never Used  Vaping Use  . Vaping Use: Never used  Substance and Sexual Activity  . Alcohol use: Yes    Comment: occasional  . Drug use: No  . Sexual activity: Yes  Other Topics Concern  . Not on file  Social History Narrative   ** Merged History Encounter **       Social Determinants of Health   Financial  Resource Strain:   . Difficulty of Paying Living Expenses: Not on file  Food Insecurity:   . Worried About Charity fundraiser in the Last Year: Not on file  . Ran Out of Food in the Last Year: Not on file  Transportation Needs:   . Lack of Transportation (Medical): Not on file  . Lack of Transportation (Non-Medical): Not on file  Physical Activity:   . Days of Exercise per Week: Not on file  . Minutes of Exercise per Session: Not on file  Stress:   . Feeling of Stress : Not on file  Social Connections:   . Frequency of Communication with Friends and Family: Not on file  . Frequency of Social Gatherings with Friends and Family: Not on file  . Attends Religious Services: Not on file  . Active Member of Clubs or Organizations: Not on file  . Attends Archivist Meetings: Not on file  . Marital Status: Not on file  Intimate Partner Violence:   . Fear of Current or Ex-Partner: Not on file  . Emotionally Abused: Not on file  . Physically Abused: Not on file  . Sexually Abused: Not on file    Family History  Problem Relation Age of Onset  . Heart attack Father   . Pancreatic cancer Father   . Melanoma Brother     ROS: no fevers or chills, productive cough, hemoptysis, dysphasia, odynophagia, melena, hematochezia, dysuria, hematuria, rash, seizure activity, orthopnea, PND, pedal edema, claudication. Remaining systems are negative.  Physical Exam: Well-developed well-nourished in no acute distress.  Skin is warm and dry.  HEENT is normal.  Neck is supple.  Chest is clear to auscultation with normal expansion.  Cardiovascular exam is irregular Abdominal exam nontender or distended. No masses palpated. Extremities show no edema. neuro grossly intact  ECG-atrial fibrillation at a rate of 74, normal axis, nonspecific ST changes. Personally reviewed  A/P  1 paroxysmal atrial fibrillation-patient is in atrial fibrillation today.  However he is asymptomatic with no  increased fatigue or dyspnea and no palpitations.  We discussed rate control versus rhythm control.  I feel that rate control will be the best option given that he is asymptomatic.  We will arrange a monitor to make sure that his heart rate is adequately controlled.  Continue apixaban.  Repeat echocardiogram.  Check TSH.  I will see him back in 3 months to make sure that he is not having increasing symptoms.  Check hemoglobin and renal function.  2 coronary artery disease status post coronary artery bypass graft-patient denies chest pain.  Continue statin.  No aspirin given need for apixaban.  3 hypertension-patient's blood pressure is controlled.  4 hyperlipidemia-continue statin.  5 history of sleep apnea-continue CPAP.  Kirk Ruths, MD

## 2019-09-12 ENCOUNTER — Other Ambulatory Visit: Payer: Self-pay

## 2019-09-12 ENCOUNTER — Ambulatory Visit (INDEPENDENT_AMBULATORY_CARE_PROVIDER_SITE_OTHER): Payer: BC Managed Care – PPO | Admitting: Cardiology

## 2019-09-12 ENCOUNTER — Encounter: Payer: Self-pay | Admitting: Cardiology

## 2019-09-12 ENCOUNTER — Telehealth: Payer: Self-pay | Admitting: Radiology

## 2019-09-12 VITALS — BP 112/84 | HR 74 | Ht 74.0 in | Wt 264.4 lb

## 2019-09-12 DIAGNOSIS — I2510 Atherosclerotic heart disease of native coronary artery without angina pectoris: Secondary | ICD-10-CM | POA: Diagnosis not present

## 2019-09-12 DIAGNOSIS — I10 Essential (primary) hypertension: Secondary | ICD-10-CM | POA: Diagnosis not present

## 2019-09-12 DIAGNOSIS — E7800 Pure hypercholesterolemia, unspecified: Secondary | ICD-10-CM

## 2019-09-12 DIAGNOSIS — I480 Paroxysmal atrial fibrillation: Secondary | ICD-10-CM | POA: Diagnosis not present

## 2019-09-12 NOTE — Telephone Encounter (Signed)
Enrolled patient for a 3 day Zio XT monitor to be mailed to patients home  

## 2019-09-12 NOTE — Patient Instructions (Signed)
Medication Instructions:  NO CHANGE *If you need a refill on your cardiac medications before your next appointment, please call your pharmacy*   Lab Work: Your physician recommends that you HAVE LAB WORK TODAY  If you have labs (blood work) drawn today and your tests are completely normal, you will receive your results only by: Marland Kitchen MyChart Message (if you have MyChart) OR . A paper copy in the mail If you have any lab test that is abnormal or we need to change your treatment, we will call you to review the results.   Testing/Procedures:  Your physician has requested that you have an echocardiogram. Echocardiography is a painless test that uses sound waves to create images of your heart. It provides your doctor with information about the size and shape of your heart and how well your heart's chambers and valves are working. This procedure takes approximately one hour. There are no restrictions for this procedure.[place] Instructions   Your physician has requested you wear your ZIO patch monitor___3____days.   This is a single patch monitor.  [employer] supplies one patch monitor per enrollment.  Additional stickers are not available.   Please do not apply patch if you will be having a Nuclear Stress Test, Echocardiogram, Cardiac CT, MRI, or Chest Xray during the time frame you would be wearing the monitor. The patch cannot be worn during these tests.  You cannot remove and re-apply the ZIO XT patch monitor.   Your ZIO patch monitor will be sent USPS Priority mail from [facility] directly to your home address. The monitor may also be mailed to a PO BOX if home delivery is not available.   It may take 3-5 days to receive your monitor after you have been enrolled.   Once you have received you monitor, please review enclosed instructions.  Your monitor has already been registered assigning a specific monitor serial # to you.   Applying the  monitor   Shave hair from upper left chest.   Hold abrader disc by orange tab.  Rub abrader in 40 strokes over left upper chest as indicated in your monitor instructions.   Clean area with 4 enclosed alcohol pads .  Use all pads to assure are is cleaned thoroughly.  Let dry.   Apply patch as indicated in monitor instructions.  Patch will be place under collarbone on left side of chest with arrow pointing upward.   Rub patch adhesive wings for 2 minutes.Remove white label marked "1".  Remove white label marked "2".  Rub patch adhesive wings for 2 additional minutes.   While looking in a mirror, press and release button in center of patch.  A small green light will flash 3-4 times .  This will be your only indicator the monitor has been turned on.     Do not shower for the first 24 hours.  You may shower after the first 24 hours.   Press button if you feel a symptom. You will hear a small click.  Record Date, Time and Symptom in the Patient Log Book.   When you are ready to remove patch, follow instructions on last 2 pages of Patient Log Book.  Stick patch monitor onto last page of Patient Log Book.   Place Patient Log Book in [place] box.  Use locking tab on box and tape box closed securely.  The [employer] and [employer] has [employer] on it.  Please place in  mailbox as soon as possible.  Your physician should have your test results approximately 7 days after the monitor has been mailed back to [facility].   Call [place] at [phone] if you have questions regarding your ZIO XT patch monitor.  Call them immediately if you see an orange light blinking on your monitor.   If your monitor falls off in less than 4 days contact our Monitor department at [phone].  If your monitor becomes loose or falls off after 4 days call [employer] at [phone] for suggestions on securing your monitor.   Follow-Up: At [facility], you and your health needs are our priority.   As part of our continuing mission to provide you with exceptional heart care, we have created designated Provider Care Teams.  These Care Teams include your primary Cardiologist (physician) and Advanced Practice Providers (APPs -  Physician Assistants and Nurse Practitioners) who all work together to provide you with the care you need, when you need it.  We recommend signing up for the patient portal called "MyChart".  Sign up information is provided on this After Visit Summary.  MyChart is used to connect with patients for Virtual Visits (Telemedicine).  Patients are able to view lab/test results, encounter notes, upcoming appointments, etc.  Non-urgent messages can be sent to your provider as well.   To learn more about what you can do with MyChart, go to [email or web address].    Your next appointment:   3 month(s)  The format for your next appointment:   In Person  Provider:   Kirk Ruths, MD

## 2019-09-13 LAB — CBC
Hematocrit: 44.1 % (ref 37.5–51.0)
Hemoglobin: 15.5 g/dL (ref 13.0–17.7)
MCH: 34.1 pg — ABNORMAL HIGH (ref 26.6–33.0)
MCHC: 35.1 g/dL (ref 31.5–35.7)
MCV: 97 fL (ref 79–97)
Platelets: 224 10*3/uL (ref 150–450)
RBC: 4.55 x10E6/uL (ref 4.14–5.80)
RDW: 12.9 % (ref 11.6–15.4)
WBC: 8.2 10*3/uL (ref 3.4–10.8)

## 2019-09-13 LAB — BASIC METABOLIC PANEL
BUN/Creatinine Ratio: 19 (ref 10–24)
BUN: 19 mg/dL (ref 8–27)
CO2: 23 mmol/L (ref 20–29)
Calcium: 9.6 mg/dL (ref 8.6–10.2)
Chloride: 104 mmol/L (ref 96–106)
Creatinine, Ser: 0.99 mg/dL (ref 0.76–1.27)
GFR calc Af Amer: 94 mL/min/{1.73_m2} (ref >59)
GFR calc non Af Amer: 81 mL/min/{1.73_m2} (ref >59)
Glucose: 103 mg/dL — ABNORMAL HIGH (ref 65–99)
Potassium: 4.6 mmol/L (ref 3.5–5.2)
Sodium: 140 mmol/L (ref 134–144)

## 2019-09-13 LAB — TSH: TSH: 0.743 u[IU]/mL (ref 0.450–4.500)

## 2019-09-18 ENCOUNTER — Ambulatory Visit (INDEPENDENT_AMBULATORY_CARE_PROVIDER_SITE_OTHER): Payer: BC Managed Care – PPO

## 2019-09-18 DIAGNOSIS — I480 Paroxysmal atrial fibrillation: Secondary | ICD-10-CM

## 2019-09-27 DIAGNOSIS — I480 Paroxysmal atrial fibrillation: Secondary | ICD-10-CM | POA: Diagnosis not present

## 2019-09-30 ENCOUNTER — Other Ambulatory Visit: Payer: Self-pay

## 2019-09-30 ENCOUNTER — Ambulatory Visit (HOSPITAL_COMMUNITY): Payer: BC Managed Care – PPO | Attending: Cardiovascular Disease

## 2019-09-30 DIAGNOSIS — I480 Paroxysmal atrial fibrillation: Secondary | ICD-10-CM | POA: Insufficient documentation

## 2019-09-30 LAB — ECHOCARDIOGRAM COMPLETE
Area-P 1/2: 4.19 cm2
S' Lateral: 2.8 cm

## 2019-10-03 DIAGNOSIS — H348322 Tributary (branch) retinal vein occlusion, left eye, stable: Secondary | ICD-10-CM | POA: Diagnosis not present

## 2019-10-03 DIAGNOSIS — H524 Presbyopia: Secondary | ICD-10-CM | POA: Diagnosis not present

## 2019-10-03 DIAGNOSIS — H2513 Age-related nuclear cataract, bilateral: Secondary | ICD-10-CM | POA: Diagnosis not present

## 2019-10-03 DIAGNOSIS — Z23 Encounter for immunization: Secondary | ICD-10-CM | POA: Diagnosis not present

## 2019-10-09 DIAGNOSIS — H43813 Vitreous degeneration, bilateral: Secondary | ICD-10-CM | POA: Diagnosis not present

## 2019-10-09 DIAGNOSIS — H34832 Tributary (branch) retinal vein occlusion, left eye, with macular edema: Secondary | ICD-10-CM | POA: Diagnosis not present

## 2019-10-09 DIAGNOSIS — H35033 Hypertensive retinopathy, bilateral: Secondary | ICD-10-CM | POA: Diagnosis not present

## 2019-11-07 DIAGNOSIS — R351 Nocturia: Secondary | ICD-10-CM | POA: Diagnosis not present

## 2019-11-07 DIAGNOSIS — N401 Enlarged prostate with lower urinary tract symptoms: Secondary | ICD-10-CM | POA: Diagnosis not present

## 2019-11-07 DIAGNOSIS — N200 Calculus of kidney: Secondary | ICD-10-CM | POA: Diagnosis not present

## 2019-11-17 DIAGNOSIS — E782 Mixed hyperlipidemia: Secondary | ICD-10-CM | POA: Diagnosis not present

## 2019-11-17 DIAGNOSIS — I2510 Atherosclerotic heart disease of native coronary artery without angina pectoris: Secondary | ICD-10-CM | POA: Diagnosis not present

## 2019-11-17 DIAGNOSIS — K760 Fatty (change of) liver, not elsewhere classified: Secondary | ICD-10-CM | POA: Diagnosis not present

## 2019-11-17 DIAGNOSIS — M1A0790 Idiopathic chronic gout, unspecified ankle and foot, without tophus (tophi): Secondary | ICD-10-CM | POA: Diagnosis not present

## 2019-11-24 DIAGNOSIS — K760 Fatty (change of) liver, not elsewhere classified: Secondary | ICD-10-CM | POA: Diagnosis not present

## 2019-11-24 DIAGNOSIS — I10 Essential (primary) hypertension: Secondary | ICD-10-CM | POA: Diagnosis not present

## 2019-11-24 DIAGNOSIS — E782 Mixed hyperlipidemia: Secondary | ICD-10-CM | POA: Diagnosis not present

## 2019-11-24 DIAGNOSIS — I2510 Atherosclerotic heart disease of native coronary artery without angina pectoris: Secondary | ICD-10-CM | POA: Diagnosis not present

## 2019-12-12 NOTE — Progress Notes (Signed)
HPI: Follow-up coronary artery disease and atrial fibrillation. Patient had coronary artery bypass and graft in April 2015 with LIMA to the LAD, saphenous vein graft to the diagonal, saphenous vein graft to the first and second marginal and saphenous vein graft to the acute marginal and distal circumflex. PreCABG dopplers showed 1-39 bilateral stenosis. Patient noted to have palpitations and rhythm strip on his apple watch showed atrial fibrillation. Started on apixaban. Echocardiogram September 2021 showed ejection fraction 50 to 55% and dilated ascending aorta at 40 mm.  Monitor September 2021 showed sinus rhythm with occasional PAC, brief PAT, occasional PVC and rare couplet.  Since last seen, the patient has dyspnea with more extreme activities but not with routine activities. It is relieved with rest. It is not associated with chest pain. There is no orthopnea, PND or pedal edema. There is no syncope or palpitations. There is no exertional chest pain.   Current Outpatient Medications  Medication Sig Dispense Refill   allopurinol (ZYLOPRIM) 100 MG tablet Take 100 mg by mouth daily.     allopurinol (ZYLOPRIM) 300 MG tablet Take 300 mg by mouth daily. Take 300 mg along with 100 mg to equal 400 mg     Ascorbic Acid (VITAMIN C) 1000 MG tablet Take 2,000 mg by mouth every morning.      atorvastatin (LIPITOR) 80 MG tablet Take 1 tablet (80 mg total) by mouth daily at 6 PM. 90 tablet 3   diphenhydrAMINE (SOMINEX) 25 MG tablet Take 50 mg by mouth at bedtime as needed for sleep.     ELIQUIS 5 MG TABS tablet TAKE 1 TABLET BY MOUTH TWICE A DAY 60 tablet 5   mometasone (NASONEX) 50 MCG/ACT nasal spray Place 2 sprays into the nose daily. 17 g 12   Multiple Vitamins-Minerals (CENTRUM SILVER PO) Take 1 tablet by mouth every morning.      Polyvinyl Alcohol-Povidone (REFRESH OP) Place 1 drop into both eyes 2 (two) times daily as needed (dry eyes).     tamsulosin (FLOMAX) 0.4 MG CAPS capsule Take  0.4 mg by mouth every morning.     No current facility-administered medications for this visit.     Past Medical History:  Diagnosis Date   CAD (coronary artery disease)    Essential hypertension    Gout    Kidney stones    Obesity (BMI 30-39.9)    S/P CABG x 6    Sleep apnea    unable to use c-pap    Past Surgical History:  Procedure Laterality Date   CORONARY ARTERY BYPASS GRAFT N/A 04/18/2013   Procedure: CORONARY ARTERY BYPASS GRAFTING (CABG) TIMES SIX USING LEFT INTERNAL MAMMARY ARTERY AND RIGHT SAPHENOUS LEG VEIN HARVESTED ENDOSCOPICALLY;  Surgeon: [name], MD;  Location: [place];  Service: Open Heart Surgery;  Laterality: N/A;   INTRAOPERATIVE TRANSESOPHAGEAL ECHOCARDIOGRAM N/A 04/18/2013   Procedure: INTRAOPERATIVE TRANSESOPHAGEAL ECHOCARDIOGRAM;  Surgeon: [name], MD;  Location: [place];  Service: Open Heart Surgery;  Laterality: N/A;   KIDNEY STONE SURGERY     LEFT AND RIGHT HEART CATHETERIZATION WITH CORONARY ANGIOGRAM N/A 04/16/2013   Procedure: LEFT AND RIGHT HEART CATHETERIZATION WITH CORONARY ANGIOGRAM;  Surgeon: Jacolyn Reedy, MD;  [facility] CATH LAB;  Service: Cardiovascular;  Laterality: N/A;   NEPHROLITHOTOMY Right 05/28/2014   Procedure: NEPHROLITHOTOMY PERCUTANEOUS;  Surgeon: Franchot Gallo, MD;  Location: [facility];  Service: Urology;  Laterality: Right;    Social History   Socioeconomic History  Marital status: Married    Spouse name: Not on file   Number of children: Not on file   Years of education: Not on file   Highest education level: Not on file  Occupational History   Not on file  Tobacco Use   Smoking status: Former Smoker    Years: 25.00    Quit date: 05/13/1994    Years since quitting: 25.6   Smokeless tobacco: Never Used  Vaping Use   Vaping Use: Never used  Substance and Sexual Activity   Alcohol use: Yes    Comment: occasional   Drug use: No   Sexual activity: Yes  Other Topics Concern    Not on file  Social History Narrative   ** Merged History Encounter **       Social Determinants of Health   Financial Resource Strain: Not on file  Food Insecurity: Not on file  Transportation Needs: Not on file  Physical Activity: Not on file  Stress: Not on file  Social Connections: Not on file  Intimate Partner Violence: Not on file    Family History  Problem Relation Age of Onset   Heart attack Father    Pancreatic cancer Father    Melanoma Brother     ROS: no fevers or chills, productive cough, hemoptysis, dysphasia, odynophagia, melena, hematochezia, dysuria, hematuria, rash, seizure activity, orthopnea, PND, pedal edema, claudication. Remaining systems are negative.  Physical Exam: Well-developed well-nourished in no acute distress.  Skin is warm and dry.  HEENT is normal.  Neck is supple.  Chest is clear to auscultation with normal expansion.  Cardiovascular exam is regular rate and rhythm.  Abdominal exam nontender or distended. No masses palpated. Extremities show no edema. neuro grossly intact  Electrocardiogram shows sinus rhythm with PVCs and no ST changes.  Personally reviewed.  A/P  1 paroxysmal atrial fibrillation-previous monitor showed that patient had converted back to sinus rhythm.  He was also asymptomatic when he was found to be in atrial fibrillation at previous office visit.  We will therefore plan rate control and anticoagulation.  Continue apixaban.  Note previous echocardiogram showed normal LV function and TSH normal.  2 coronary artery disease-patient doing well with no chest pain.  Continue statin.  We will not treat with aspirin given need for apixaban.  3 hypertension-blood pressure controlled on meds.  4 hyperlipidemia-continue statin.  5 sleep apnea-continue CPAP.  Kirk Ruths, MD

## 2019-12-19 ENCOUNTER — Other Ambulatory Visit: Payer: Self-pay | Admitting: Cardiology

## 2019-12-19 NOTE — Telephone Encounter (Signed)
Prescription refill request for Eliquis received. Indication: Atrial Fibrillation Last office visit: 09/2019 [place] Scr: 0.99 09/2019 Age: 64 Weight:119.9 kg  Prescription refilled

## 2019-12-23 ENCOUNTER — Ambulatory Visit (INDEPENDENT_AMBULATORY_CARE_PROVIDER_SITE_OTHER): Payer: BC Managed Care – PPO | Admitting: Cardiology

## 2019-12-23 ENCOUNTER — Other Ambulatory Visit: Payer: Self-pay

## 2019-12-23 ENCOUNTER — Encounter: Payer: Self-pay | Admitting: Cardiology

## 2019-12-23 VITALS — BP 100/60 | HR 63 | Ht 74.0 in | Wt 260.0 lb

## 2019-12-23 DIAGNOSIS — I480 Paroxysmal atrial fibrillation: Secondary | ICD-10-CM

## 2019-12-23 DIAGNOSIS — I2510 Atherosclerotic heart disease of native coronary artery without angina pectoris: Secondary | ICD-10-CM | POA: Diagnosis not present

## 2019-12-23 DIAGNOSIS — E7800 Pure hypercholesterolemia, unspecified: Secondary | ICD-10-CM

## 2019-12-23 DIAGNOSIS — I10 Essential (primary) hypertension: Secondary | ICD-10-CM

## 2019-12-23 MED ORDER — APIXABAN 5 MG PO TABS: 5.0000 mg | ORAL_TABLET | Freq: Two times a day (BID) | ORAL | 5 refills | Status: DC

## 2019-12-23 NOTE — Patient Instructions (Signed)

## 2019-12-31 DIAGNOSIS — H35033 Hypertensive retinopathy, bilateral: Secondary | ICD-10-CM | POA: Diagnosis not present

## 2019-12-31 DIAGNOSIS — H43393 Other vitreous opacities, bilateral: Secondary | ICD-10-CM | POA: Diagnosis not present

## 2019-12-31 DIAGNOSIS — H34832 Tributary (branch) retinal vein occlusion, left eye, with macular edema: Secondary | ICD-10-CM | POA: Diagnosis not present

## 2020-01-29 ENCOUNTER — Other Ambulatory Visit: Payer: Self-pay | Admitting: Cardiology

## 2020-01-29 DIAGNOSIS — E7800 Pure hypercholesterolemia, unspecified: Secondary | ICD-10-CM

## 2020-02-09 ENCOUNTER — Ambulatory Visit: Payer: BC Managed Care – PPO | Admitting: Dermatology

## 2020-02-16 ENCOUNTER — Ambulatory Visit (INDEPENDENT_AMBULATORY_CARE_PROVIDER_SITE_OTHER): Payer: BC Managed Care – PPO | Admitting: Dermatology

## 2020-02-16 ENCOUNTER — Other Ambulatory Visit: Payer: Self-pay

## 2020-02-16 DIAGNOSIS — Z1283 Encounter for screening for malignant neoplasm of skin: Secondary | ICD-10-CM

## 2020-02-16 DIAGNOSIS — L821 Other seborrheic keratosis: Secondary | ICD-10-CM

## 2020-02-16 DIAGNOSIS — D485 Neoplasm of uncertain behavior of skin: Secondary | ICD-10-CM

## 2020-02-16 DIAGNOSIS — Q828 Other specified congenital malformations of skin: Secondary | ICD-10-CM | POA: Diagnosis not present

## 2020-02-18 ENCOUNTER — Telehealth: Payer: Self-pay | Admitting: Dermatology

## 2020-02-18 MED ORDER — MUPIROCIN 2 % EX OINT: 1.0000 "application " | TOPICAL_OINTMENT | Freq: Two times a day (BID) | CUTANEOUS | 0 refills | Status: DC

## 2020-02-18 NOTE — Telephone Encounter (Signed)
Patient left message on office voice mail saying that he has a biopsy site that has a red ring around it.  What should he do?

## 2020-02-18 NOTE — Telephone Encounter (Signed)
Ok per Dr Denna Haggard mupirocin oint 2% 1-2 times daily

## 2020-02-24 ENCOUNTER — Telehealth: Payer: Self-pay | Admitting: *Deleted

## 2020-02-24 DIAGNOSIS — G4733 Obstructive sleep apnea (adult) (pediatric): Secondary | ICD-10-CM | POA: Diagnosis not present

## 2020-02-24 NOTE — Telephone Encounter (Signed)
-----   Message from Lavonna Monarch, MD sent at 02/24/2020  6:26 AM EST ----- Please let patient know that biopsy showed a somewhat uncommon condition called porokeratosis.  This does have a small risk of becoming nonmelanoma skin cancer but is not now considered cancer.  If the spot remained stable, it does not require any more intervention.  If there is a change such as growth or symptoms or bleeding, Dr. Darene Lamer will recheck.

## 2020-02-24 NOTE — Telephone Encounter (Signed)
Pathology to patient.  °

## 2020-02-28 ENCOUNTER — Encounter: Payer: Self-pay | Admitting: Dermatology

## 2020-02-28 NOTE — Progress Notes (Signed)
   Follow-Up Visit   Subjective  Gary [name] is a 65 y.o. male who presents for the following: Skin Problem (Patient here today for spots to be checked. One on left thigh, one on right thigh and on on the scrotum x several months.  No bleeding, no pain.).  General skin check Location: Newer spots near each knee Duration:  Quality:  Associated Signs/Symptoms: Modifying Factors:  Severity:  Timing: Context:   Objective  Well appearing patient in no apparent distress; mood and affect are within normal limits. Objective  Right Knee - Anterior: Tan 6 mm flattopped textured papule  Objective  Right Breast: Full body skin examination: No atypical moles or melanoma  Objective  Left Knee - Anterior: Slightly inflamed pink 7 mm crust, rule out CIS       A full examination was performed including scalp, head, eyes, ears, nose, lips, neck, chest, axillae, abdomen, back, buttocks, bilateral upper extremities, bilateral lower extremities, hands, feet, fingers, toes, fingernails, and toenails. All findings within normal limits unless otherwise noted below.   Assessment & Plan    Seborrheic keratosis Right Knee - Anterior  Okay to leave if stable  Encounter for screening for malignant neoplasm of skin Right Breast  Annual skin examination, encouraged to self examine twice annually.  Continue ultraviolet protection  Neoplasm of uncertain behavior of skin Left Knee - Anterior  Skin / nail biopsy Type of biopsy: tangential   Informed consent: discussed and consent obtained   Timeout: patient name, date of birth, surgical site, and procedure verified   Procedure prep:  Patient was prepped and draped in usual sterile fashion (Non sterile) Prep type:  Chlorhexidine Anesthesia: the lesion was anesthetized in a standard fashion   Anesthetic:  1% lidocaine w/ epinephrine 1-100,000 local infiltration Instrument used: flexible razor blade   Outcome: patient tolerated procedure  well   Post-procedure details: wound care instructions given    Destruction of lesion Complexity: simple   Destruction method: electrodesiccation and curettage   Informed consent: discussed and consent obtained   Timeout:  patient name, date of birth, surgical site, and procedure verified Anesthesia: the lesion was anesthetized in a standard fashion   Anesthetic:  1% lidocaine w/ epinephrine 1-100,000 local infiltration Curettage performed in three different directions: Yes   Curettage cycles:  1 Margin per side (cm):  0.1 Final wound size (cm):  1.1 Hemostasis achieved with:  aluminum chloride Outcome: patient tolerated procedure well with no complications   Post-procedure details: wound care instructions given    Specimen 1 - Surgical pathology Differential Diagnosis: bcc vs scc Check Margins: No      I, Lavonna Monarch, MD, have reviewed all documentation for this visit.  The documentation on 02/28/20 for the exam, diagnosis, procedures, and orders are all accurate and complete.

## 2020-04-07 DIAGNOSIS — H43393 Other vitreous opacities, bilateral: Secondary | ICD-10-CM | POA: Diagnosis not present

## 2020-04-07 DIAGNOSIS — H35033 Hypertensive retinopathy, bilateral: Secondary | ICD-10-CM | POA: Diagnosis not present

## 2020-04-07 DIAGNOSIS — H43813 Vitreous degeneration, bilateral: Secondary | ICD-10-CM | POA: Diagnosis not present

## 2020-04-07 DIAGNOSIS — H34832 Tributary (branch) retinal vein occlusion, left eye, with macular edema: Secondary | ICD-10-CM | POA: Diagnosis not present

## 2020-04-26 DIAGNOSIS — I2510 Atherosclerotic heart disease of native coronary artery without angina pectoris: Secondary | ICD-10-CM | POA: Diagnosis not present

## 2020-04-26 DIAGNOSIS — E782 Mixed hyperlipidemia: Secondary | ICD-10-CM | POA: Diagnosis not present

## 2020-04-26 DIAGNOSIS — I10 Essential (primary) hypertension: Secondary | ICD-10-CM | POA: Diagnosis not present

## 2020-04-26 DIAGNOSIS — Z125 Encounter for screening for malignant neoplasm of prostate: Secondary | ICD-10-CM | POA: Diagnosis not present

## 2020-04-26 DIAGNOSIS — K760 Fatty (change of) liver, not elsewhere classified: Secondary | ICD-10-CM | POA: Diagnosis not present

## 2020-05-03 DIAGNOSIS — I10 Essential (primary) hypertension: Secondary | ICD-10-CM | POA: Diagnosis not present

## 2020-05-03 DIAGNOSIS — E782 Mixed hyperlipidemia: Secondary | ICD-10-CM | POA: Diagnosis not present

## 2020-05-03 DIAGNOSIS — Z0000 Encounter for general adult medical examination without abnormal findings: Secondary | ICD-10-CM | POA: Diagnosis not present

## 2020-05-03 DIAGNOSIS — I2510 Atherosclerotic heart disease of native coronary artery without angina pectoris: Secondary | ICD-10-CM | POA: Diagnosis not present

## 2020-06-09 NOTE — Progress Notes (Signed)
HPI: Follow-up coronary artery disease and atrial fibrillation. Patient had coronary artery bypass and graft in April 2015 with LIMA to the LAD, saphenous vein graft to the diagonal, saphenous vein graft to the first and second marginal and saphenous vein graft to the acute marginal and distal circumflex. PreCABG dopplers showed 1-39 bilateral stenosis. Patient noted to have palpitations and rhythm strip on his apple watch showed atrial fibrillation. Started on apixaban. Echocardiogram September 2021 showed ejection fraction 50 to 55% and dilated ascending aorta at 40 mm.  Monitor September 2021 showed sinus rhythm with occasional PAC, brief PAT, occasional PVC and rare couplet.  Since last seen, he denies dyspnea, chest pain or syncope.  Rare palpitations.  No bleeding.  Current Outpatient Medications  Medication Sig Dispense Refill   allopurinol (ZYLOPRIM) 100 MG tablet Take 100 mg by mouth daily.     apixaban (ELIQUIS) 5 MG TABS tablet Take 1 tablet (5 mg total) by mouth 2 (two) times daily. 180 tablet 5   Ascorbic Acid (VITAMIN C) 1000 MG tablet Take 2,000 mg by mouth every morning.      atorvastatin (LIPITOR) 80 MG tablet TAKE 1 TABLET BY MOUTH EVERY DAY AT 6PM 90 tablet 3   diphenhydrAMINE (SOMINEX) 25 MG tablet Take 50 mg by mouth at bedtime as needed for sleep.     mometasone (NASONEX) 50 MCG/ACT nasal spray Place 2 sprays into the nose daily. 17 g 12   Multiple Vitamins-Minerals (CENTRUM SILVER PO) Take 1 tablet by mouth every morning.      mupirocin ointment (BACTROBAN) 2 % Apply 1 application topically 2 (two) times daily. 22 g 0   Polyvinyl Alcohol-Povidone (REFRESH OP) Place 1 drop into both eyes 2 (two) times daily as needed (dry eyes).     tamsulosin (FLOMAX) 0.4 MG CAPS capsule Take 0.4 mg by mouth every morning.     triamcinolone (NASACORT) 55 MCG/ACT AERO nasal inhaler 1 spray in each nostril     No current facility-administered medications for this visit.     Past  Medical History:  Diagnosis Date   CAD (coronary artery disease)    Essential hypertension    Gout    Kidney stones    Obesity (BMI 30-39.9)    S/P CABG x 6    Sleep apnea    unable to use c-pap    Past Surgical History:  Procedure Laterality Date   CORONARY ARTERY BYPASS GRAFT N/A 04/18/2013   Procedure: CORONARY ARTERY BYPASS GRAFTING (CABG) TIMES SIX USING LEFT INTERNAL MAMMARY ARTERY AND RIGHT SAPHENOUS LEG VEIN HARVESTED ENDOSCOPICALLY;  Surgeon: [name], MD;  Location: [place] [facility];  Service: Open Heart Surgery;  Laterality: N/A;   INTRAOPERATIVE TRANSESOPHAGEAL ECHOCARDIOGRAM N/A 04/18/2013   Procedure: INTRAOPERATIVE TRANSESOPHAGEAL ECHOCARDIOGRAM;  Surgeon: [name], MD;  Location: [place];  Service: Open Heart Surgery;  Laterality: N/A;   KIDNEY STONE SURGERY     LEFT AND RIGHT HEART CATHETERIZATION WITH CORONARY ANGIOGRAM N/A 04/16/2013   Procedure: LEFT AND RIGHT HEART CATHETERIZATION WITH CORONARY ANGIOGRAM;  Surgeon: Jacolyn Reedy, MD;  Location: [facility] CATH LAB;  Service: Cardiovascular;  Laterality: N/A;   NEPHROLITHOTOMY Right 05/28/2014   Procedure: NEPHROLITHOTOMY PERCUTANEOUS;  Surgeon: Franchot Gallo, MD;  Location: [facility];  Service: Urology;  Laterality: Right;    Social History   Socioeconomic History   Marital status: Married    Spouse name: Not on file   Number of children: Not on file   Years of  education: Not on file   Highest education level: Not on file  Occupational History   Not on file  Tobacco Use   Smoking status: Former    Years: 25.00    Pack years: 0.00    Types: Cigarettes    Quit date: 05/13/1994    Years since quitting: 26.1   Smokeless tobacco: Never  Vaping Use   Vaping Use: Never used  Substance and Sexual Activity   Alcohol use: Yes    Comment: occasional   Drug use: No   Sexual activity: Yes  Other Topics Concern   Not on file  Social History Narrative   ** Merged History Encounter **       Social Determinants  of Health   Financial Resource Strain: Not on file  Food Insecurity: Not on file  Transportation Needs: Not on file  Physical Activity: Not on file  Stress: Not on file  Social Connections: Not on file  Intimate Partner Violence: Not on file    Family History  Problem Relation Age of Onset   Heart attack Father    Pancreatic cancer Father    Melanoma Brother     ROS: no fevers or chills, productive cough, hemoptysis, dysphasia, odynophagia, melena, hematochezia, dysuria, hematuria, rash, seizure activity, orthopnea, PND, pedal edema, claudication. Remaining systems are negative.  Physical Exam: Well-developed well-nourished in no acute distress.  Skin is warm and dry.  HEENT is normal.  Neck is supple.  Chest is clear to auscultation with normal expansion.  Cardiovascular exam is regular rate and rhythm.  Abdominal exam nontender or distended. No masses palpated. Extremities show no edema. neuro grossly intact   A/P  1 PAF-pt remains in sinus on examination; continue apixaban.  2 hypertension-BP controlled on no medications.  3 coronary artery disease-he denies chest pain.  Continue statin.  He is not on aspirin given need for apixaban.  4 hyperlipidemia-continue statin.  5 sleep apnea-we will continue CPAP.  Kirk Ruths, MD

## 2020-06-22 ENCOUNTER — Encounter: Payer: Self-pay | Admitting: Cardiology

## 2020-06-22 ENCOUNTER — Other Ambulatory Visit: Payer: Self-pay

## 2020-06-22 ENCOUNTER — Ambulatory Visit (INDEPENDENT_AMBULATORY_CARE_PROVIDER_SITE_OTHER): Payer: BC Managed Care – PPO | Admitting: Cardiology

## 2020-06-22 VITALS — BP 112/70 | HR 71 | Ht 74.0 in | Wt 267.2 lb

## 2020-06-22 DIAGNOSIS — I480 Paroxysmal atrial fibrillation: Secondary | ICD-10-CM

## 2020-06-22 DIAGNOSIS — I10 Essential (primary) hypertension: Secondary | ICD-10-CM

## 2020-06-22 DIAGNOSIS — E7800 Pure hypercholesterolemia, unspecified: Secondary | ICD-10-CM

## 2020-06-22 DIAGNOSIS — I2510 Atherosclerotic heart disease of native coronary artery without angina pectoris: Secondary | ICD-10-CM | POA: Diagnosis not present

## 2020-06-22 NOTE — Patient Instructions (Signed)

## 2020-07-29 DIAGNOSIS — H35033 Hypertensive retinopathy, bilateral: Secondary | ICD-10-CM | POA: Diagnosis not present

## 2020-07-29 DIAGNOSIS — H43813 Vitreous degeneration, bilateral: Secondary | ICD-10-CM | POA: Diagnosis not present

## 2020-07-29 DIAGNOSIS — H34832 Tributary (branch) retinal vein occlusion, left eye, with macular edema: Secondary | ICD-10-CM | POA: Diagnosis not present

## 2020-07-29 DIAGNOSIS — H43393 Other vitreous opacities, bilateral: Secondary | ICD-10-CM | POA: Diagnosis not present

## 2020-09-17 DIAGNOSIS — Z23 Encounter for immunization: Secondary | ICD-10-CM | POA: Diagnosis not present

## 2020-10-11 DIAGNOSIS — H524 Presbyopia: Secondary | ICD-10-CM | POA: Diagnosis not present

## 2020-10-11 DIAGNOSIS — H2513 Age-related nuclear cataract, bilateral: Secondary | ICD-10-CM | POA: Diagnosis not present

## 2020-10-11 DIAGNOSIS — H348322 Tributary (branch) retinal vein occlusion, left eye, stable: Secondary | ICD-10-CM | POA: Diagnosis not present

## 2020-10-21 DIAGNOSIS — H8110 Benign paroxysmal vertigo, unspecified ear: Secondary | ICD-10-CM | POA: Diagnosis not present

## 2020-12-06 DIAGNOSIS — I2510 Atherosclerotic heart disease of native coronary artery without angina pectoris: Secondary | ICD-10-CM | POA: Diagnosis not present

## 2020-12-13 DIAGNOSIS — I10 Essential (primary) hypertension: Secondary | ICD-10-CM | POA: Diagnosis not present

## 2020-12-13 DIAGNOSIS — E782 Mixed hyperlipidemia: Secondary | ICD-10-CM | POA: Diagnosis not present

## 2020-12-13 DIAGNOSIS — M79644 Pain in right finger(s): Secondary | ICD-10-CM | POA: Diagnosis not present

## 2020-12-13 DIAGNOSIS — I480 Paroxysmal atrial fibrillation: Secondary | ICD-10-CM | POA: Diagnosis not present

## 2021-01-15 ENCOUNTER — Other Ambulatory Visit: Payer: Self-pay | Admitting: Cardiology

## 2021-01-15 DIAGNOSIS — I480 Paroxysmal atrial fibrillation: Secondary | ICD-10-CM

## 2021-01-17 NOTE — Telephone Encounter (Signed)
Prescription refill request for Eliquis received. Indication:Afib Last office visit:6/22 Scr:1.1 Age: 66 Weight:121.2 kg  Prescription refilled

## 2021-01-19 ENCOUNTER — Other Ambulatory Visit: Payer: Self-pay | Admitting: Cardiology

## 2021-01-19 DIAGNOSIS — E7800 Pure hypercholesterolemia, unspecified: Secondary | ICD-10-CM

## 2021-01-19 DIAGNOSIS — G4733 Obstructive sleep apnea (adult) (pediatric): Secondary | ICD-10-CM | POA: Diagnosis not present

## 2021-01-27 DIAGNOSIS — H34832 Tributary (branch) retinal vein occlusion, left eye, with macular edema: Secondary | ICD-10-CM | POA: Diagnosis not present

## 2021-01-27 DIAGNOSIS — H43813 Vitreous degeneration, bilateral: Secondary | ICD-10-CM | POA: Diagnosis not present

## 2021-01-27 DIAGNOSIS — H35033 Hypertensive retinopathy, bilateral: Secondary | ICD-10-CM | POA: Diagnosis not present

## 2021-01-27 DIAGNOSIS — H43393 Other vitreous opacities, bilateral: Secondary | ICD-10-CM | POA: Diagnosis not present

## 2021-02-19 DIAGNOSIS — G4733 Obstructive sleep apnea (adult) (pediatric): Secondary | ICD-10-CM | POA: Diagnosis not present

## 2021-03-19 DIAGNOSIS — G4733 Obstructive sleep apnea (adult) (pediatric): Secondary | ICD-10-CM | POA: Diagnosis not present

## 2021-06-08 NOTE — Progress Notes (Signed)
TMA:[id number] coronary artery disease and atrial fibrillation. Patient had coronary artery bypass and graft in April 2015 with LIMA to the LAD, saphenous vein graft to the diagonal, saphenous vein graft to the first and second marginal and saphenous vein graft to the acute marginal and distal circumflex. PreCABG dopplers showed 1-39 bilateral stenosis. Patient noted to have palpitations and rhythm strip on his apple watch showed atrial fibrillation. Started on apixaban. Echocardiogram September 2021 showed ejection fraction 50 to 55% and dilated ascending aorta at 40 mm.  Monitor September 2021 showed sinus rhythm with occasional PAC, brief PAT, occasional PVC and rare couplet.  Since last seen, he denies dyspnea on exertion, orthopnea, PND, pedal edema, exertional chest pain or syncope.  He does have occasional palpitations at night.  Current Outpatient Medications  Medication Sig Dispense Refill   allopurinol (ZYLOPRIM) 100 MG tablet Take 100 mg by mouth daily.     apixaban (ELIQUIS) 5 MG TABS tablet TAKE 1 TABLET BY MOUTH TWICE A DAY 60 tablet 10   Ascorbic Acid (VITAMIN C) 1000 MG tablet Take 2,000 mg by mouth every morning.      atorvastatin (LIPITOR) 80 MG tablet TAKE 1 TABLET BY MOUTH EVERY DAY AT 6PM 90 tablet 3   diphenhydrAMINE (SOMINEX) 25 MG tablet Take 50 mg by mouth at bedtime as needed for sleep.     mometasone (NASONEX) 50 MCG/ACT nasal spray Place 2 sprays into the nose daily. 17 g 12   Multiple Vitamins-Minerals (CENTRUM SILVER PO) Take 1 tablet by mouth every morning.      Polyvinyl Alcohol-Povidone (REFRESH OP) Place 1 drop into both eyes 2 (two) times daily as needed (dry eyes).     tamsulosin (FLOMAX) 0.4 MG CAPS capsule Take 0.4 mg by mouth every morning.     triamcinolone (NASACORT) 55 MCG/ACT AERO nasal inhaler 1 spray in each nostril     No current facility-administered medications for this visit.     Past Medical History:  Diagnosis Date   CAD (coronary  artery disease)    Essential hypertension    Gout    Kidney stones    Obesity (BMI 30-39.9)    S/P CABG x 6    Sleep apnea    unable to use c-pap    Past Surgical History:  Procedure Laterality Date   CORONARY ARTERY BYPASS GRAFT N/A 04/18/2013   Procedure: CORONARY ARTERY BYPASS GRAFTING (CABG) TIMES SIX USING LEFT INTERNAL MAMMARY ARTERY AND RIGHT SAPHENOUS LEG VEIN HARVESTED ENDOSCOPICALLY;  Surgeon: [name], MD;  Location: [place];  Service: Open Heart Surgery;  Laterality: N/A;   INTRAOPERATIVE TRANSESOPHAGEAL ECHOCARDIOGRAM N/A 04/18/2013   Procedure: INTRAOPERATIVE TRANSESOPHAGEAL ECHOCARDIOGRAM;  Surgeon: [name], MD;  Location: [place];  Service: Open Heart Surgery;  Laterality: N/A;   KIDNEY STONE SURGERY     LEFT AND RIGHT HEART CATHETERIZATION WITH CORONARY ANGIOGRAM N/A 04/16/2013   Procedure: LEFT AND RIGHT HEART CATHETERIZATION WITH CORONARY ANGIOGRAM;  Surgeon: Jacolyn Reedy, MD;  Location: [facility] CATH LAB;  Service: Cardiovascular;  Laterality: N/A;   NEPHROLITHOTOMY Right 05/28/2014   Procedure: NEPHROLITHOTOMY PERCUTANEOUS;  Surgeon: Franchot Gallo, MD;  Location: [facility];  Service: Urology;  Laterality: Right;    Social History   Socioeconomic History   Marital status: Married    Spouse name: Not on file   Number of children: Not on file   Years of education: Not on file   Highest education level: Not on file  Occupational  History   Not on file  Tobacco Use   Smoking status: Former    Years: 25.00    Types: Cigarettes    Quit date: 05/13/1994    Years since quitting: 27.1   Smokeless tobacco: Never  Vaping Use   Vaping Use: Never used  Substance and Sexual Activity   Alcohol use: Yes    Comment: occasional   Drug use: No   Sexual activity: Yes  Other Topics Concern   Not on file  Social History Narrative   ** Merged History Encounter **       Social Determinants of Health   Financial Resource Strain: Not on file  Food Insecurity:  Not on file  Transportation Needs: Not on file  Physical Activity: Not on file  Stress: Not on file  Social Connections: Not on file  Intimate Partner Violence: Not on file    Family History  Problem Relation Age of Onset   Heart attack Father    Pancreatic cancer Father    Melanoma Brother     ROS: no fevers or chills, productive cough, hemoptysis, dysphasia, odynophagia, melena, hematochezia, dysuria, hematuria, rash, seizure activity, orthopnea, PND, pedal edema, claudication. Remaining systems are negative.  Physical Exam: Well-developed well-nourished in no acute distress.  Skin is warm and dry.  HEENT is normal.  Neck is supple.  Chest is clear to auscultation with normal expansion.  Cardiovascular exam is regular rate and rhythm.  Abdominal exam nontender or distended. No masses palpated. Extremities show no edema. neuro grossly intact  ECG-sinus bradycardia at a rate of 49, no ST changes.  Personally reviewed  A/P  1 paroxysmal atrial fibrillation-patient remains in sinus rhythm.  We will continue apixaban.  Note he is having occasional palpitations at night.  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  If he has frequent episodes of atrial fibrillation can consider addition of antiarrhythmic.  I will have his most recent hemoglobin and renal function forwarded to [place].  2 hypertension-patient's blood pressure is controlled.   3 coronary artery disease-patient denies chest pain.  Plan to continue medical therapy.  Continue statin.  No aspirin given need for apixaban.  4 history of sleep apnea-continue CPAP.  5 hyperlipidemia-continue statin.  I will have his most recent lipids and liver forwarded to [place].  6 abdominal aneurysm screening-patient will be 66 in December.  We will arrange ultrasound to exclude aneurysm at that time as he does have a history of tobacco abuse in the remote past.  Kirk Ruths, MD

## 2021-06-17 ENCOUNTER — Encounter: Payer: Self-pay | Admitting: Cardiology

## 2021-06-17 ENCOUNTER — Ambulatory Visit (INDEPENDENT_AMBULATORY_CARE_PROVIDER_SITE_OTHER): Payer: BC Managed Care – PPO | Admitting: Cardiology

## 2021-06-17 VITALS — BP 118/60 | HR 49 | Ht 74.0 in | Wt 270.8 lb

## 2021-06-17 DIAGNOSIS — I10 Essential (primary) hypertension: Secondary | ICD-10-CM

## 2021-06-17 DIAGNOSIS — I480 Paroxysmal atrial fibrillation: Secondary | ICD-10-CM

## 2021-06-17 DIAGNOSIS — E7800 Pure hypercholesterolemia, unspecified: Secondary | ICD-10-CM

## 2021-06-17 DIAGNOSIS — I2510 Atherosclerotic heart disease of native coronary artery without angina pectoris: Secondary | ICD-10-CM | POA: Diagnosis not present

## 2021-06-17 NOTE — Patient Instructions (Signed)
  Follow-Up: At [facility], you and your health needs are our priority.  As part of our continuing mission to provide you with exceptional heart care, we have created designated Provider Care Teams.  These Care Teams include your primary Cardiologist (physician) and Advanced Practice Providers (APPs -  Physician Assistants and Nurse Practitioners) who all work together to provide you with the care you need, when you need it.  We recommend signing up for the patient portal called "MyChart".  Sign up information is provided on this After Visit Summary.  MyChart is used to connect with patients for Virtual Visits (Telemedicine).  Patients are able to view lab/test results, encounter notes, upcoming appointments, etc.  Non-urgent messages can be sent to your provider as well.   To learn more about what you can do with MyChart, go to [email or web address].    Your next appointment:   12 month(s)  The format for your next appointment:   In Person  Provider:   Kirk Ruths MD      Important Information About [place]

## 2021-06-20 DIAGNOSIS — Z0000 Encounter for general adult medical examination without abnormal findings: Secondary | ICD-10-CM | POA: Diagnosis not present

## 2021-06-20 DIAGNOSIS — Z125 Encounter for screening for malignant neoplasm of prostate: Secondary | ICD-10-CM | POA: Diagnosis not present

## 2021-06-27 DIAGNOSIS — I480 Paroxysmal atrial fibrillation: Secondary | ICD-10-CM | POA: Diagnosis not present

## 2021-06-27 DIAGNOSIS — Z0000 Encounter for general adult medical examination without abnormal findings: Secondary | ICD-10-CM | POA: Diagnosis not present

## 2021-06-27 DIAGNOSIS — I10 Essential (primary) hypertension: Secondary | ICD-10-CM | POA: Diagnosis not present

## 2021-06-27 DIAGNOSIS — E782 Mixed hyperlipidemia: Secondary | ICD-10-CM | POA: Diagnosis not present

## 2021-06-30 ENCOUNTER — Encounter: Payer: Self-pay | Admitting: *Deleted

## 2021-07-18 DIAGNOSIS — H34832 Tributary (branch) retinal vein occlusion, left eye, with macular edema: Secondary | ICD-10-CM | POA: Diagnosis not present

## 2021-07-18 DIAGNOSIS — H31092 Other chorioretinal scars, left eye: Secondary | ICD-10-CM | POA: Diagnosis not present

## 2021-07-18 DIAGNOSIS — H43813 Vitreous degeneration, bilateral: Secondary | ICD-10-CM | POA: Diagnosis not present

## 2021-07-18 DIAGNOSIS — H35371 Puckering of macula, right eye: Secondary | ICD-10-CM | POA: Diagnosis not present

## 2021-07-18 DIAGNOSIS — H35033 Hypertensive retinopathy, bilateral: Secondary | ICD-10-CM | POA: Diagnosis not present

## 2021-07-28 DIAGNOSIS — D485 Neoplasm of uncertain behavior of skin: Secondary | ICD-10-CM | POA: Diagnosis not present

## 2021-07-28 DIAGNOSIS — C4371 Malignant melanoma of right lower limb, including hip: Secondary | ICD-10-CM | POA: Diagnosis not present

## 2021-07-28 DIAGNOSIS — Z1283 Encounter for screening for malignant neoplasm of skin: Secondary | ICD-10-CM | POA: Diagnosis not present

## 2021-07-28 DIAGNOSIS — D225 Melanocytic nevi of trunk: Secondary | ICD-10-CM | POA: Diagnosis not present

## 2021-08-03 DIAGNOSIS — Z85820 Personal history of malignant melanoma of skin: Secondary | ICD-10-CM | POA: Diagnosis not present

## 2021-08-03 DIAGNOSIS — Z08 Encounter for follow-up examination after completed treatment for malignant neoplasm: Secondary | ICD-10-CM | POA: Diagnosis not present

## 2021-08-08 DIAGNOSIS — C439 Malignant melanoma of skin, unspecified: Secondary | ICD-10-CM | POA: Diagnosis not present

## 2021-08-09 ENCOUNTER — Telehealth: Payer: Self-pay | Admitting: Cardiology

## 2021-08-09 NOTE — Telephone Encounter (Signed)
   Pre-operative Risk Assessment    Patient Name: Gary [name]  DOB: Mar 31, 1955 MRN: [id number]      Request for Surgical Clearance    Procedure:   Excision of melanoma right leg   Date of Surgery:  Clearance 08/17/21                                 Surgeon:  Dr. Crissie Reese  Surgeon's Group or Practice Name:  [facility]  Phone number:  [phone] Fax number:  [phone]   Type of Clearance Requested:   - Medical  - Pharmacy:  Hold Apixaban (Eliquis) discontinue 2 days prior and remain off 2 days after    Type of Anesthesia:  MAC with local    Additional requests/questions:   Dr. Harlow Mares said he plans to bridge with Lovenox 16m, subcutaneous injection once a day.   SDorthey Sawyer  08/09/2021, 10:11 AM

## 2021-08-10 ENCOUNTER — Ambulatory Visit: Payer: Self-pay | Admitting: Plastic Surgery

## 2021-08-10 ENCOUNTER — Telehealth: Payer: Self-pay | Admitting: *Deleted

## 2021-08-10 ENCOUNTER — Other Ambulatory Visit: Payer: Self-pay

## 2021-08-10 ENCOUNTER — Encounter (HOSPITAL_BASED_OUTPATIENT_CLINIC_OR_DEPARTMENT_OTHER): Payer: Self-pay | Admitting: Plastic Surgery

## 2021-08-10 ENCOUNTER — Telehealth: Payer: Self-pay

## 2021-08-10 DIAGNOSIS — C439 Malignant melanoma of skin, unspecified: Secondary | ICD-10-CM | POA: Diagnosis not present

## 2021-08-10 NOTE — Telephone Encounter (Signed)
Pt has been scheduled for a tele visit, 08/12/21.

## 2021-08-10 NOTE — Telephone Encounter (Signed)
Patient with diagnosis of afib on Eliquis for anticoagulation.    Procedure: Excision of melanoma right leg  Date of procedure: 08/17/21  CHA2DS2-VASc Score = 2   This indicates a 2.2% annual risk of stroke. The patient's score is based upon: CHF History: 0 HTN History: 1 Diabetes History: 0 Stroke History: 0 Vascular Disease History: 1 Age Score: 0 Gender Score: 0      CrCl 90 ml/min  Per office protocol, patient can hold Eliquis for 2 days prior to procedure.    Patient does NOT need bridging with Lovenox (enoxaparin) around procedure.  **This guidance is not considered finalized until pre-operative APP has relayed final recommendations.**

## 2021-08-10 NOTE — Telephone Encounter (Signed)
   Pre-operative Risk Assessment    Patient Name: Gary [name]  DOB: 1955-02-21 MRN: [id number]      Request for Surgical Clearance{  Procedure:   EXCISION OF MELANOMA RIGHT LEG  Date of Surgery:  Clearance 08/17/21                                 Surgeon:  DR Harlow Mares Surgeon's Group or Practice Name:  [facility] Phone number:  [phone] Fax number:  [phone] - Pharmacy:  Hold Apixaban (Eliquis) for days prior , I plan to bridge with Lovenox 40 MG subcutaneous once daily   Type of Anesthesia:   N/A    Additional requests/questions:  Please advise surgeon/provider what medications should be held.  Signed, Jeanmarie Plant Wayland Baik  CCMA 01/20/2022, 5:03 PM

## 2021-08-10 NOTE — Telephone Encounter (Signed)
Pt has been scheduled for a tele visit, 08/12/21/ 3:20.  Consent on file / medications reconciled.    Patient Consent for Virtual Visit        Gary [name] has provided verbal consent on 08/10/2021 for a virtual visit (video or telephone).   CONSENT FOR VIRTUAL VISIT FOR:  Gary [name]  By participating in this virtual visit I agree to the following:  I hereby voluntarily request, consent and authorize [facility] and its employed or contracted physicians, physician assistants, nurse practitioners or other licensed health care professionals (the Practitioner), to provide me with telemedicine health care services (the "Services") as deemed necessary by the treating Practitioner. I acknowledge and consent to receive the Services by the Practitioner via telemedicine. I understand that the telemedicine visit will involve communicating with the Practitioner through live audiovisual communication technology and the disclosure of certain medical information by electronic transmission. I acknowledge that I have been given the opportunity to request an in-person assessment or other available alternative prior to the telemedicine visit and am voluntarily participating in the telemedicine visit.  I understand that I have the right to withhold or withdraw my consent to the use of telemedicine in the course of my care at any time, without affecting my right to future care or treatment, and that the Practitioner or I may terminate the telemedicine visit at any time. I understand that I have the right to inspect all information obtained and/or recorded in the course of the telemedicine visit and may receive copies of available information for a reasonable fee.  I understand that some of the potential risks of receiving the Services via telemedicine include:  Delay or interruption in medical evaluation due to technological equipment failure or disruption; Information transmitted may not be sufficient (e.g. poor  resolution of images) to allow for appropriate medical decision making by the Practitioner; and/or  In rare instances, security protocols could fail, causing a breach of personal health information.  Furthermore, I acknowledge that it is my responsibility to provide information about my medical history, conditions and care that is complete and accurate to the best of my ability. I acknowledge that Practitioner's advice, recommendations, and/or decision may be based on factors not within their control, such as incomplete or inaccurate data provided by me or distortions of diagnostic images or specimens that may result from electronic transmissions. I understand that the practice of medicine is not an exact science and that Practitioner makes no warranties or guarantees regarding treatment outcomes. I acknowledge that a copy of this consent can be made available to me via my patient portal ([facility]), or I can request a printed copy by calling the office of [facility].    I understand that my insurance will be billed for this visit.   I have read or had this consent read to me. I understand the contents of this consent, which adequately explains the benefits and risks of the Services being provided via telemedicine.  I have been provided ample opportunity to ask questions regarding this consent and the Services and have had my questions answered to my satisfaction. I give my informed consent for the services to be provided through the use of telemedicine in my medical care

## 2021-08-11 NOTE — Telephone Encounter (Signed)
   Patient Name: Gary [name]  DOB: Nov 02, 1955 MRN: [id number]  Primary Cardiologist: None  Chart reviewed as part of pre-operative protocol coverage. Given past medical history and time since last visit, based on ACC/AHA guidelines, [name] would be at acceptable risk for the planned procedure without further cardiovascular testing.   Per office protocol, patient can hold Eliquis for 1 days prior to procedure.   Patient will not need bridging with Lovenox (enoxaparin) around procedure.     If MD doing procedure wishes to bridge, please be sure 10-12 hours from last dose of Lovenox to re-start Eliquis  I will route this recommendation to the requesting party via [facility] fax function and remove from pre-op pool.  Please call with questions.  Mable Fill, Marissa Nestle, NP 08/11/2021, 11:50 AM

## 2021-08-11 NOTE — Telephone Encounter (Signed)
Patient with diagnosis of atrial fibrillation on Eliquis for anticoagulation.    Procedure: excision of melanoma right leg Date of procedure: 08/17/21   CHA2DS2-VASc Score = 2   This indicates a 2.2% annual risk of stroke. The patient's score is based upon: CHF History: 0 HTN History: 1 Diabetes History: 0 Stroke History: 0 Vascular Disease History: 1 Age Score: 0 Gender Score: 0   CrCl 112 Platelet count 164  Per office protocol, patient can hold Eliquis for 1 days prior to procedure.   Patient will not need bridging with Lovenox (enoxaparin) around procedure.    If MD doing procedure wishes to bridge, please be sure 10-12 hours from last dose of Lovenox to re-start Eliquis  **This guidance is not considered finalized until pre-operative APP has relayed final recommendations.**

## 2021-08-12 ENCOUNTER — Ambulatory Visit (INDEPENDENT_AMBULATORY_CARE_PROVIDER_SITE_OTHER): Payer: BC Managed Care – PPO | Admitting: Physician Assistant

## 2021-08-12 DIAGNOSIS — Z01810 Encounter for preprocedural cardiovascular examination: Secondary | ICD-10-CM | POA: Diagnosis not present

## 2021-08-12 NOTE — Progress Notes (Signed)
Virtual Visit via Telephone Note   Because of Gary [name]'s co-morbid illnesses, he is at least at moderate risk for complications without adequate follow up.  This format is felt to be most appropriate for this patient at this time.  The patient did not have access to video technology/had technical difficulties with video requiring transitioning to audio format only (telephone).  All issues noted in this document were discussed and addressed.  No physical exam could be performed with this format.  Please refer to the patient's chart for his consent to telehealth for [facility].  Evaluation Performed:  Preoperative cardiovascular risk assessment _____________   Date:  08/12/2021   Patient ID:  Gary [name], DOB 10-25-1955, MRN [id number] Patient Location:  Home Provider location:   Office  Primary Care Provider:  Merrilee Seashore, MD Primary Cardiologist:  None  Chief Complaint / Patient Profile   66 y.o. y/o male with a h/o CAD status post CABG x6, PAF on Eliquis, OSA using CPAP who is pending excision of melanoma on the right leg and presents today for telephonic preoperative cardiovascular risk assessment.  Past Medical History    Past Medical History:  Diagnosis Date   Arthritis    CAD (coronary artery disease) 2015   CABGx6   Gout    Kidney stones    Obesity (BMI 30-39.9)    PAF (paroxysmal atrial fibrillation) (HCC)    on Eliquis   S/P CABG x 6    Sleep apnea    uses CPAP nightly   Past Surgical History:  Procedure Laterality Date   CORONARY ARTERY BYPASS GRAFT N/A 04/18/2013   Procedure: CORONARY ARTERY BYPASS GRAFTING (CABG) TIMES SIX USING LEFT INTERNAL MAMMARY ARTERY AND RIGHT SAPHENOUS LEG VEIN HARVESTED ENDOSCOPICALLY;  Surgeon: [name], MD;  Location: [facility];  Service: Open Heart Surgery;  Laterality: N/A;   INTRAOPERATIVE TRANSESOPHAGEAL ECHOCARDIOGRAM N/A 04/18/2013   Procedure: INTRAOPERATIVE TRANSESOPHAGEAL ECHOCARDIOGRAM;  Surgeon:  [name], MD;  Location: [facility];  Service: Open Heart Surgery;  Laterality: N/A;   KIDNEY STONE SURGERY     LEFT AND RIGHT HEART CATHETERIZATION WITH CORONARY ANGIOGRAM N/A 04/16/2013   Procedure: LEFT AND RIGHT HEART CATHETERIZATION WITH CORONARY ANGIOGRAM;  Surgeon: Jacolyn Reedy, MD;  Location: [facility] CATH LAB;  Service: Cardiovascular;  Laterality: N/A;   NEPHROLITHOTOMY Right 05/28/2014   Procedure: NEPHROLITHOTOMY PERCUTANEOUS;  Surgeon: Franchot Gallo, MD;  Location: [facility];  Service: Urology;  Laterality: Right;    Allergies  Allergies  Allergen Reactions   Contrast Media [Iodinated Contrast Media] Hives, Shortness Of Breath and Nausea And Vomiting   Iodinated Contrast Media Other (See Comments)    History of Present Illness    Gary [name] is a 66 y.o. male who presents via audio/video conferencing for a telehealth visit today.  Pt was last seen in cardiology clinic on 06/17/2021 by Dr. Stanford Breed.  At that time [name] was doing well .  The patient is now pending procedure as outlined above. Since his last visit, he feels fine without any chest pain, shortness of breath, or palpitations.  He walks 4 miles a day.  He lives in a 3 story home and has no trouble with steps.  He is able to do all of the inside and outside work.  He is an avid Animator.  He does not play any highly strenuous sports.  He is scored a 6.36 on the DASI.  This exceeds the 4 METS minimum requirement.  He states  that the surgeons office plans to do 1 Lovenox shot prior to procedure..   Per office protocol, patient can hold Eliquis for 1 days prior to procedure.   Patient will not need bridging with Lovenox (enoxaparin) around procedure.     If MD doing procedure wishes to bridge, please be sure 10-12 hours from last dose of Lovenox to re-start Eliquis.   Reports no shortness of breath nor dyspnea on exertion. Reports no chest pain, pressure, or tightness. No edema, orthopnea, PND. Reports no  palpitations.    Home Medications    Prior to Admission medications   Medication Sig Start Date End Date Taking? Authorizing Provider  allopurinol (ZYLOPRIM) 100 MG tablet Take 100 mg by mouth daily.    [provider]  apixaban (ELIQUIS) 5 MG TABS tablet TAKE 1 TABLET BY MOUTH TWICE A DAY 01/17/21   Lelon Perla, MD  Ascorbic Acid (VITAMIN C) 1000 MG tablet Take 2,000 mg by mouth every morning.     [provider]  atorvastatin (LIPITOR) 80 MG tablet TAKE 1 TABLET BY MOUTH EVERY DAY AT [facility] 01/19/21   Lelon Perla, MD  diphenhydrAMINE (SOMINEX) 25 MG tablet Take 50 mg by mouth at bedtime as needed for sleep.    [provider]  mometasone (NASONEX) 50 MCG/ACT nasal spray Place 2 sprays into the nose daily. 10/30/16   Dohmeier, Asencion Partridge, MD  Multiple Vitamins-Minerals (CENTRUM SILVER PO) Take 1 tablet by mouth every morning.     [provider]  Polyvinyl Alcohol-Povidone (REFRESH OP) Place 1 drop into both eyes 2 (two) times daily as needed (dry eyes).    [provider]  tamsulosin (FLOMAX) 0.4 MG CAPS capsule Take 0.4 mg by mouth every morning.    [provider]  triamcinolone (NASACORT) 55 MCG/ACT AERO nasal inhaler 1 spray in each nostril 10/05/17   [provider]    Physical Exam    Vital Signs:  [name] does not have vital signs available for review today.  Given telephonic nature of communication, physical exam is limited. AAOx3. NAD. Normal affect.  Speech and respirations are unlabored.  Accessory Clinical Findings    None  Assessment & Plan    1.  Preoperative Cardiovascular Risk Assessment:  [name] perioperative risk of a major cardiac event is 0.9% according to the Revised Cardiac Risk Index (RCRI).  Therefore, he is at low risk for perioperative complications.   His functional capacity is good at 6.36 METs according to the [facility] Activity Status Index (DASI). Recommendations: According to  ACC/AHA guidelines, no further cardiovascular testing needed.  The patient may proceed to surgery at acceptable risk.   Antiplatelet and/or Anticoagulation Recommendations: Eliquis (Apixaban) can be held for 1 days prior to surgery.  Please resume post op when felt to be safe.    A copy of this note will be routed to requesting surgeon.  Time:   Today, I have spent 6 minutes with the patient with telehealth technology discussing medical history, symptoms, and management plan.     Elgie Collard, PA-C  08/12/2021, 10:44 AM

## 2021-08-17 ENCOUNTER — Ambulatory Visit (HOSPITAL_BASED_OUTPATIENT_CLINIC_OR_DEPARTMENT_OTHER)
Admission: RE | Admit: 2021-08-17 | Discharge: 2021-08-17 | Disposition: A | Discharge status: Home / Self Care | Payer: BC Managed Care – PPO | Attending: Plastic Surgery | Admitting: Plastic Surgery

## 2021-08-17 ENCOUNTER — Ambulatory Visit (HOSPITAL_BASED_OUTPATIENT_CLINIC_OR_DEPARTMENT_OTHER): Payer: BC Managed Care – PPO | Admitting: Anesthesiology

## 2021-08-17 ENCOUNTER — Encounter (HOSPITAL_BASED_OUTPATIENT_CLINIC_OR_DEPARTMENT_OTHER): Payer: Self-pay | Admitting: Plastic Surgery

## 2021-08-17 ENCOUNTER — Encounter (HOSPITAL_BASED_OUTPATIENT_CLINIC_OR_DEPARTMENT_OTHER)
Admission: RE | Disposition: A | Discharge status: Home / Self Care | Payer: Self-pay | Source: Home / Self Care | Attending: Plastic Surgery

## 2021-08-17 ENCOUNTER — Other Ambulatory Visit: Payer: Self-pay

## 2021-08-17 DIAGNOSIS — C4371 Malignant melanoma of right lower limb, including hip: Secondary | ICD-10-CM | POA: Diagnosis not present

## 2021-08-17 DIAGNOSIS — M1990 Unspecified osteoarthritis, unspecified site: Secondary | ICD-10-CM | POA: Diagnosis not present

## 2021-08-17 DIAGNOSIS — I2510 Atherosclerotic heart disease of native coronary artery without angina pectoris: Secondary | ICD-10-CM | POA: Diagnosis not present

## 2021-08-17 DIAGNOSIS — Z01818 Encounter for other preprocedural examination: Secondary | ICD-10-CM

## 2021-08-17 DIAGNOSIS — L97919 Non-pressure chronic ulcer of unspecified part of right lower leg with unspecified severity: Secondary | ICD-10-CM | POA: Diagnosis not present

## 2021-08-17 DIAGNOSIS — Z951 Presence of aortocoronary bypass graft: Secondary | ICD-10-CM | POA: Insufficient documentation

## 2021-08-17 DIAGNOSIS — C439 Malignant melanoma of skin, unspecified: Secondary | ICD-10-CM | POA: Diagnosis not present

## 2021-08-17 DIAGNOSIS — G4730 Sleep apnea, unspecified: Secondary | ICD-10-CM | POA: Diagnosis not present

## 2021-08-17 DIAGNOSIS — Z87891 Personal history of nicotine dependence: Secondary | ICD-10-CM | POA: Diagnosis not present

## 2021-08-17 DIAGNOSIS — I10 Essential (primary) hypertension: Secondary | ICD-10-CM | POA: Diagnosis not present

## 2021-08-17 DIAGNOSIS — L905 Scar conditions and fibrosis of skin: Secondary | ICD-10-CM | POA: Diagnosis not present

## 2021-08-17 HISTORY — DX: Unspecified osteoarthritis, unspecified site: M19.90

## 2021-08-17 HISTORY — PX: LESION EXCISION WITH COMPLEX REPAIR: SHX6700

## 2021-08-17 HISTORY — DX: Paroxysmal atrial fibrillation: I48.0

## 2021-08-17 SURGERY — LESION EXCISION WITH COMPLEX REPAIR
Anesthesia: Monitor Anesthesia Care | Site: Leg Lower | Laterality: Right

## 2021-08-17 MED ORDER — MIDAZOLAM HCL 5 MG/5ML IJ SOLN
INTRAMUSCULAR | Status: DC | PRN
  Administered 2021-08-17 (×2): 1 mg via INTRAVENOUS

## 2021-08-17 MED ORDER — FENTANYL CITRATE (PF) 100 MCG/2ML IJ SOLN
INTRAMUSCULAR | Status: DC | PRN
Start: 2021-08-17 — End: 2021-08-17
  Administered 2021-08-17 (×2): 50 ug via INTRAVENOUS

## 2021-08-17 MED ORDER — PROPOFOL 10 MG/ML IV BOLUS
INTRAVENOUS | Status: DC | PRN
  Administered 2021-08-17: 20 mg via INTRAVENOUS
  Administered 2021-08-17: 30 mg via INTRAVENOUS

## 2021-08-17 MED ORDER — CHLORHEXIDINE GLUCONATE CLOTH 2 % EX PADS: 6.0000 | MEDICATED_PAD | Freq: Once | CUTANEOUS | Status: DC

## 2021-08-17 MED ORDER — ONDANSETRON HCL 4 MG/2ML IJ SOLN
INTRAMUSCULAR | Status: AC
  Filled 2021-08-17: qty 2

## 2021-08-17 MED ORDER — MIDAZOLAM HCL 2 MG/2ML IJ SOLN
INTRAMUSCULAR | Status: AC
  Filled 2021-08-17: qty 2

## 2021-08-17 MED ORDER — LIDOCAINE-EPINEPHRINE 1 %-1:100000 IJ SOLN
INTRAMUSCULAR | Status: DC | PRN
  Administered 2021-08-17: 15 mL

## 2021-08-17 MED ORDER — HEPARIN SODIUM (PORCINE) 5000 UNIT/ML IJ SOLN
INTRAMUSCULAR | Status: AC
  Filled 2021-08-17: qty 1

## 2021-08-17 MED ORDER — FENTANYL CITRATE (PF) 100 MCG/2ML IJ SOLN
INTRAMUSCULAR | Status: AC
  Filled 2021-08-17: qty 2

## 2021-08-17 MED ORDER — PROPOFOL 500 MG/50ML IV EMUL
INTRAVENOUS | Status: DC | PRN
  Administered 2021-08-17: 50 ug/kg/min via INTRAVENOUS

## 2021-08-17 MED ORDER — ONDANSETRON HCL 4 MG/2ML IJ SOLN
INTRAMUSCULAR | Status: DC | PRN
  Administered 2021-08-17: 4 mg via INTRAVENOUS

## 2021-08-17 MED ORDER — HEPARIN SODIUM (PORCINE) 5000 UNIT/ML IJ SOLN
5000.0000 [IU] | Freq: Once | INTRAMUSCULAR | Status: AC
  Administered 2021-08-17: 5000 [IU] via SUBCUTANEOUS

## 2021-08-17 MED ORDER — LACTATED RINGERS IV SOLN: INTRAVENOUS | Status: DC

## 2021-08-17 SURGICAL SUPPLY — 79 items
ADH SKN CLS APL DERMABOND .7 (GAUZE/BANDAGES/DRESSINGS)
APL PRP STRL LF DISP 70% ISPRP (MISCELLANEOUS) ×1
APL SWBSTK 6 STRL LF DISP (MISCELLANEOUS)
APPLICATOR COTTON TIP 6 STRL (MISCELLANEOUS) IMPLANT
APPLICATOR COTTON TIP 6IN STRL (MISCELLANEOUS) IMPLANT
BALL CTTN LRG ABS STRL LF (GAUZE/BANDAGES/DRESSINGS) ×2
BLADE CLIPPER SURG (BLADE) IMPLANT
BLADE SURG 15 STRL LF DISP TIS (BLADE) ×2 IMPLANT
BLADE SURG 15 STRL SS (BLADE) ×4
BNDG ELASTIC 4X5.8 VLCR STR LF (GAUZE/BANDAGES/DRESSINGS) IMPLANT
BNDG GAUZE DERMACEA FLUFF (GAUZE/BANDAGES/DRESSINGS) ×1
BNDG GAUZE DERMACEA FLUFF 4 (GAUZE/BANDAGES/DRESSINGS) IMPLANT
BNDG GZE DERMACEA 4 6PLY (GAUZE/BANDAGES/DRESSINGS) ×1
CANISTER SUCT 1200ML W/VALVE (MISCELLANEOUS) IMPLANT
CHLORAPREP W/TINT 26 (MISCELLANEOUS) ×3 IMPLANT
COTTONBALL LRG STERILE PKG (GAUZE/BANDAGES/DRESSINGS) ×2 IMPLANT
COVER BACK TABLE 60X90IN (DRAPES) ×3 IMPLANT
COVER MAYO STAND STRL (DRAPES) ×3 IMPLANT
DERMABOND ADVANCED (GAUZE/BANDAGES/DRESSINGS)
DERMABOND ADVANCED .7 DNX12 (GAUZE/BANDAGES/DRESSINGS) IMPLANT
DRAPE EXTREMITY T 121X128X90 (DISPOSABLE) IMPLANT
DRAPE LAPAROTOMY 100X72 PEDS (DRAPES) IMPLANT
DRAPE U-SHAPE 76X120 STRL (DRAPES) IMPLANT
DRSG PAD ABDOMINAL 8X10 ST (GAUZE/BANDAGES/DRESSINGS) ×1 IMPLANT
ELECT NDL TIP 2.8 STRL (NEEDLE) IMPLANT
ELECT NEEDLE TIP 2.8 STRL (NEEDLE) ×2 IMPLANT
ELECT REM PT RETURN 9FT ADLT (ELECTROSURGICAL) ×2
ELECTRODE REM PT RTRN 9FT ADLT (ELECTROSURGICAL) IMPLANT
GAUZE SPONGE 4X4 12PLY STRL (GAUZE/BANDAGES/DRESSINGS) IMPLANT
GAUZE SPONGE 4X4 12PLY STRL LF (GAUZE/BANDAGES/DRESSINGS) IMPLANT
GAUZE XEROFORM 1X8 LF (GAUZE/BANDAGES/DRESSINGS) ×3 IMPLANT
GAUZE XEROFORM 5X9 LF (GAUZE/BANDAGES/DRESSINGS) IMPLANT
GLOVE BIO SURGEON STRL SZ7.5 (GLOVE) ×3 IMPLANT
GLOVE BIOGEL PI IND STRL 8 (GLOVE) ×2 IMPLANT
GLOVE BIOGEL PI INDICATOR 8 (GLOVE) ×1
GOWN STRL REUS W/ TWL LRG LVL3 (GOWN DISPOSABLE) ×2 IMPLANT
GOWN STRL REUS W/ TWL XL LVL3 (GOWN DISPOSABLE) ×2 IMPLANT
GOWN STRL REUS W/TWL LRG LVL3 (GOWN DISPOSABLE) ×2
GOWN STRL REUS W/TWL XL LVL3 (GOWN DISPOSABLE) ×2
MATRIX WOUND 3-LAYER 5X5 (Tissue) ×1 IMPLANT
MICROMATRIX 500MG (Tissue) ×2 IMPLANT
NDL HYPO 27GX1-1/4 (NEEDLE) IMPLANT
NDL HYPO 30X.5 LL (NEEDLE) ×2 IMPLANT
NEEDLE HYPO 27GX1-1/4 (NEEDLE) IMPLANT
NEEDLE HYPO 30X.5 LL (NEEDLE) ×2 IMPLANT
NS IRRIG 1000ML POUR BTL (IV SOLUTION) ×3 IMPLANT
PACK BASIN DAY SURGERY FS (CUSTOM PROCEDURE TRAY) ×3 IMPLANT
PENCIL SMOKE EVACUATOR (MISCELLANEOUS) ×3 IMPLANT
SHEET MEDIUM DRAPE 40X70 STRL (DRAPES) ×1 IMPLANT
SLEEVE SCD COMPRESS KNEE MED (STOCKING) IMPLANT
SOLUTION PARTIC MCRMTRX 500MG (Tissue) IMPLANT
SPIKE FLUID TRANSFER (MISCELLANEOUS) ×3 IMPLANT
SPONGE T-LAP 18X18 ~~LOC~~+RFID (SPONGE) ×3 IMPLANT
STOCKINETTE IMPERVIOUS LG (DRAPES) IMPLANT
STRIP SUTURE WOUND CLOSURE 1/2 (MISCELLANEOUS) IMPLANT
SUCTION FRAZIER HANDLE 10FR (MISCELLANEOUS)
SUCTION TUBE FRAZIER 10FR DISP (MISCELLANEOUS) IMPLANT
SUT ETHILON 3 0 PS 1 (SUTURE) IMPLANT
SUT ETHILON 4 0 P 3 18 (SUTURE) IMPLANT
SUT ETHILON 4 0 PS 2 18 (SUTURE) IMPLANT
SUT ETHILON 5 0 P 3 18 (SUTURE)
SUT MNCRL AB 3-0 PS2 18 (SUTURE) IMPLANT
SUT MNCRL AB 3-0 PS2 27 (SUTURE) IMPLANT
SUT MON AB 2-0 CT1 36 (SUTURE) IMPLANT
SUT MON AB 4-0 PC3 18 (SUTURE) IMPLANT
SUT NYLON ETHILON 5-0 P-3 1X18 (SUTURE) IMPLANT
SUT PROLENE 3 0 PS 2 (SUTURE) IMPLANT
SUT PROLENE 5 0 P 3 (SUTURE) ×1 IMPLANT
SUT PROLENE 6 0 P 1 18 (SUTURE) IMPLANT
SUT SILK 4 0 PS 2 (SUTURE) IMPLANT
SUT VIC AB 4-0 P-3 18XBRD (SUTURE) IMPLANT
SUT VIC AB 4-0 P3 18 (SUTURE)
SUT VIC AB 5-0 P-3 18X BRD (SUTURE) IMPLANT
SUT VIC AB 5-0 P3 18 (SUTURE)
SYR BULB EAR ULCER 3OZ GRN STR (SYRINGE) ×3 IMPLANT
SYR CONTROL 10ML LL (SYRINGE) ×3 IMPLANT
TOWEL GREEN STERILE FF (TOWEL DISPOSABLE) ×3 IMPLANT
TUBE CONNECTING 20X1/4 (TUBING) IMPLANT
YANKAUER SUCT BULB TIP NO VENT (SUCTIONS) IMPLANT

## 2021-08-17 NOTE — Anesthesia Postprocedure Evaluation (Signed)
Anesthesia Post Note  Patient: Gary [name]  Procedure(s) Performed: LESION EXCISION OF RIGHT LEG WITH COMPLEX REPAIR AND APPLICATION WITH A CELL (Right: Leg Lower)     Patient location during evaluation: PACU Anesthesia Type: MAC Level of consciousness: awake and alert Pain management: pain level controlled Vital Signs Assessment: post-procedure vital signs reviewed and stable Respiratory status: spontaneous breathing, nonlabored ventilation, respiratory function stable and patient connected to nasal cannula oxygen Cardiovascular status: stable and blood pressure returned to baseline Postop Assessment: no apparent nausea or vomiting Anesthetic complications: no   No notable events documented.  Last Vitals:  Vitals:   08/17/21 0958 08/17/21 1243  BP: 126/88 135/89  Pulse: (!) 59 (!) 52  Resp: 16 18  Temp: 36.7 C (!) 36.1 C  SpO2: 100% 100%    Last Pain:  Vitals:   08/17/21 0958  TempSrc: Oral  PainSc: 0-No pain                 [name]

## 2021-08-17 NOTE — Discharge Instructions (Addendum)
  No vigorous activity for 2 weeks (including outdoor walks) No driving for 1 week OK to walk up stairs slowly Keep the right leg elevated as much as possible No shower. Keep the dressing dry   Post Anesthesia Home Care Instructions  Activity: Get plenty of rest for the remainder of the day. A responsible individual must stay with you for 24 hours following the procedure.  For the next 24 hours, DO NOT: -Drive a car -Paediatric nurse -Drink alcoholic beverages -Take any medication unless instructed by your physician -Make any legal decisions or sign important papers.  Meals: Start with liquid foods such as gelatin or soup. Progress to regular foods as tolerated. Avoid greasy, spicy, heavy foods. If nausea and/or vomiting occur, drink only clear liquids until the nausea and/or vomiting subsides. Call your physician if vomiting continues.  Special Instructions/Symptoms: Your throat may feel dry or sore from the anesthesia or the breathing tube placed in your throat during surgery. If this causes discomfort, gargle with warm salt water. The discomfort should disappear within 24 hours.  If you had a scopolamine patch placed behind your ear for the management of post- operative nausea and/or vomiting:  1. The medication in the patch is effective for 72 hours, after which it should be removed.  Wrap patch in a tissue and discard in the trash. Wash hands thoroughly with soap and water. 2. You may remove the patch earlier than 72 hours if you experience unpleasant side effects which may include dry mouth, dizziness or visual disturbances. 3. Avoid touching the patch. Wash your hands with soap and water after contact with the patch.

## 2021-08-17 NOTE — Brief Op Note (Signed)
08/17/2021  12:37 PM  PATIENT:  Gary [name]  66 y.o. male  PRE-OPERATIVE DIAGNOSIS:  melanoma of right leg  POST-OPERATIVE DIAGNOSIS:  melanoma of right leg  PROCEDURE:  Procedure(s) with comments: LESION EXCISION OF RIGHT LEG WITH COMPLEX REPAIR AND APPLICATION WITH A CELL (Right) - LOCAL  SURGEON:  Surgeon(s) and Role:    Crissie Reese, MD - Primary  PHYSICIAN ASSISTANT:   ASSISTANTS: none   ANESTHESIA:   IV sedation and 1% Xylocaine with epinephrine  EBL:  Minimal  BLOOD ADMINISTERED:none  DRAINS: none   LOCAL MEDICATIONS USED:  XYLOCAINE   SPECIMEN:  Source of Specimen:  Right lower leg  DISPOSITION OF SPECIMEN:  PATHOLOGY  COUNTS:  YES  TOURNIQUET:  250 mm Hg Total Tourniquet Time Documented: Thigh (Right) - 4 minutes Total: Thigh (Right) - 4 minutes   DICTATION: .Other Dictation: Dictation Number [id number]  PLAN OF CARE: Discharge to home after PACU  PATIENT DISPOSITION:  PACU - hemodynamically stable.   Delay start of Pharmacological VTE agent (>24hrs) due to surgical blood loss or risk of bleeding: not applicable

## 2021-08-17 NOTE — Op Note (Signed)
NAMELUCCA, BALLO MEDICAL RECORD NO: [id number] ACCOUNT NO: [id number] DATE OF BIRTH: 01-07-1956 FACILITY: [facility] LOCATION: [facility] PHYSICIAN: Youlanda Roys. Harlow Mares, MD  Operative Report   DATE OF PROCEDURE: 08/17/2021   PREOPERATIVE DIAGNOSIS:  A thin melanoma 0.5 mm and absolute tumor thickness right lower extremity.  POSTOPERATIVE DIAGNOSIS:  A thin melanoma 0.5 mm and absolute tumor thickness right lower extremity.   PROCEDURE PERFORMED:   1.  Excision thin melanoma, right lower extremity, diameter of excision greater than 3 cm. 2.  Complex wound closure 7 cm as a distinct procedure. 3.  Application of Acell biologic 5 cm2.  SURGEON:  Youlanda Roys. Harlow Mares, MD.  INDICATIONS:  Sedation with 1% Xylocaine with epinephrine.  TOURNIQUET TIME:  3 minutes at 250 mmHg.  CLINICAL NOTE:  This 66 year old man with thin melanoma 0.5 mm absolute tumor thickness right lower extremity just below the knee and just anterior to the popliteal fossa.  This has had a shaved biopsy by his dermatologist.  He presents for a wider  excision.  The nature of the procedure and risks discussed with the patient.  He understood that most likely this wound could not be closed primarily due to its size after excision with adequate margins.  He understood further reconstruction surgeries  may prove necessary.  Plan was to close as much as possible with sutures as a complex wound closure and then use ACell biologic for the remainder of the wound.  He understood all this.  All of his questions were answered and he wished to proceed.  DESCRIPTION OF PROCEDURE:  The patient was taken to the operating room and placed supine.  Under satisfactory sedation anesthesia, he was prepped with Betadine and draped with sterile drapes.  Satisfactory local anesthesia was achieved using 1% Xylocaine  with epinephrine.  The leg was elevated and the tourniquet inflated to 250 mmHg.  Excision was performed with 1 cm margins, total diameter of  the wound resultant was greater than 3 cm.  Hemostasis was achieved using electrocautery.  A 0 Prolene suture  was used as a subcuticular suture around the periphery of the wound in order to decrease its diameter.  This was tightened and the wound diameter decreased considerably using this closure method.  This was approximately 7 to 8 cm closure.  The remainder  of the wound was then treated with ACell.  The powder was applied.  The ACell was applied over this.  Care was taken to make certain it was oriented properly.  Total area was approximately 5-6 cm.  A 4-0 silk suture was used at all 4 quadrants to secure  it and also 1 tail of the suture left alone as a tie-over bolster.  K-Y jelly was then applied followed by Adaptic and then more K-Y jelly.  Cotton balls soaked in saline and also coated with K-Y jelly were applied over this and the tie-over bolster was  secured using the 4-0 silk sutures.  A dry sterile dressing was applied as well as some pressure using Ace wrap from foot to above the knee.  He was transferred to the recovery room stable, having tolerated the procedure well.  DISPOSITION:  He will follow up in the office next week.  He has been instructed to keep his leg elevated when possible.      SUJ D: 08/17/2021 12:34:23 pm T: 08/17/2021 9:47:00 pm  JOB: [id number]/ [id number]

## 2021-08-17 NOTE — H&P (Signed)
I have re-examined and re-evaluated the patient and there are no changes.  See offic notes in paper chart for H&P.

## 2021-08-17 NOTE — Anesthesia Preprocedure Evaluation (Addendum)
Anesthesia Evaluation  Patient identified by MRN, date of birth, ID band Patient awake    Reviewed: Allergy & Precautions, NPO status , Patient's Chart, lab work & pertinent test results  Airway Mallampati: II  TM Distance: >3 FB Neck ROM: Full    Dental no notable dental hx. (+) Teeth Intact, Dental Advisory Given, Caps   Pulmonary sleep apnea and Continuous Positive Airway Pressure Ventilation , former smoker,    Pulmonary exam normal breath sounds clear to auscultation       Cardiovascular hypertension, + CAD and + CABG  Normal cardiovascular exam+ pacemaker  Rhythm:Regular Rate:Normal     Neuro/Psych negative neurological ROS  negative psych ROS   GI/Hepatic negative GI ROS, Neg liver ROS,   Endo/Other  Morbid obesity  Renal/GU Renal disease  negative genitourinary   Musculoskeletal  (+) Arthritis , Osteoarthritis,    Abdominal   Peds negative pediatric ROS (+)  Hematology negative hematology ROS (+)   Anesthesia Other Findings   Reproductive/Obstetrics negative OB ROS                            Anesthesia Physical Anesthesia Plan  ASA: 3  Anesthesia Plan: MAC   Post-op Pain Management: Minimal or no pain anticipated   Induction: Intravenous  PONV Risk Score and Plan: 1 and Propofol infusion and Midazolam  Airway Management Planned: Mask and Natural Airway  Additional Equipment: None  Intra-op Plan:   Post-operative Plan:   Informed Consent:   Plan Discussed with: Anesthesiologist  Anesthesia Plan Comments:        Anesthesia Quick Evaluation

## 2021-08-17 NOTE — Transfer of Care (Signed)
Immediate Anesthesia Transfer of Care Note  Patient: Gary [name]  Procedure(s) Performed: LESION EXCISION OF RIGHT LEG WITH COMPLEX REPAIR AND APPLICATION WITH A CELL (Right: Leg Lower)  Patient Location: PACU  Anesthesia Type:MAC  Level of Consciousness: awake, alert  and oriented  Airway & Oxygen Therapy: Patient Spontanous Breathing  Post-op Assessment: Report given to RN and Post -op Vital signs reviewed and stable  Post vital signs: Reviewed and stable  Last Vitals:  Vitals Value Taken Time  BP    Temp    Pulse    Resp    SpO2      Last Pain:  Vitals:   08/17/21 0958  TempSrc: Oral  PainSc: 0-No pain      Patients Stated Pain Goal: 3 (94/58/59 2924)  Complications: No notable events documented.

## 2021-08-18 ENCOUNTER — Encounter (HOSPITAL_BASED_OUTPATIENT_CLINIC_OR_DEPARTMENT_OTHER): Payer: Self-pay | Admitting: Plastic Surgery

## 2021-08-23 LAB — SURGICAL PATHOLOGY

## 2021-09-28 DIAGNOSIS — C439 Malignant melanoma of skin, unspecified: Secondary | ICD-10-CM | POA: Diagnosis not present

## 2021-10-14 DIAGNOSIS — Z23 Encounter for immunization: Secondary | ICD-10-CM | POA: Diagnosis not present

## 2021-10-18 DIAGNOSIS — H524 Presbyopia: Secondary | ICD-10-CM | POA: Diagnosis not present

## 2021-10-18 DIAGNOSIS — H2513 Age-related nuclear cataract, bilateral: Secondary | ICD-10-CM | POA: Diagnosis not present

## 2021-10-19 DIAGNOSIS — C439 Malignant melanoma of skin, unspecified: Secondary | ICD-10-CM | POA: Diagnosis not present

## 2021-11-07 DIAGNOSIS — Z86010 Personal history of colonic polyps: Secondary | ICD-10-CM | POA: Diagnosis not present

## 2021-11-07 DIAGNOSIS — I480 Paroxysmal atrial fibrillation: Secondary | ICD-10-CM | POA: Diagnosis not present

## 2021-11-09 ENCOUNTER — Telehealth: Payer: Self-pay

## 2021-11-09 NOTE — Telephone Encounter (Signed)
   Name: Gary [name]  DOB: 02-19-1955  MRN: [id number]  Primary Cardiologist: Dr. Kirk Ruths  Chart reviewed as part of pre-operative protocol coverage. Because of [name]'s past medical history and time since last visit, he will require a follow-up telephone visit in order to better assess preoperative cardiovascular risk.  Pre-op covering staff: - Please schedule appointment and call patient to inform them. If patient already had an upcoming appointment within acceptable timeframe, please add "pre-op clearance" to the appointment notes so provider is aware. - Please contact requesting surgeon's office via preferred method (i.e, phone, fax) to inform them of need for appointment prior to surgery.  Procedure: Colonoscopy  Date of procedure: 12/06/2021  CHA2DS2-VASc Score = 2   This indicates a 2.2% annual risk of stroke. The patient's score is based upon: CHF History: 0 HTN History: 1 Diabetes History: 0 Stroke History: 0 Vascular Disease History: 1 Age Score: 0 Gender Score: 0     CrCl : >100 (SrCr. 1.16 based on lab from 06/20/2021) Platelet count 164 (06/20/2021)     Per office protocol, patient can hold Eliquis  for 1-2 days prior to procedure.    Finis Bud, NP  11/09/2021, 3:07 PM

## 2021-11-09 NOTE — Telephone Encounter (Signed)
   Pre-operative Risk Assessment    Patient Name: Gary [name]  DOB: 03-17-1955 MRN: [id number]      Request for Surgical Clearance    Procedure:   COLONOSCPY  Date of Surgery:  Clearance 12/06/21                                 Surgeon:  DR Therisa Doyne Surgeon's Group or Practice Name:  [facility] Phone number:  [phone] Fax number:  [phone]   Type of Clearance Requested:   - Pharmacy:  Hold Apixaban (Eliquis) OKAY TO HOLD FOR 1-2 DAYS??   Type of Anesthesia:   PROPOFOL   Additional requests/questions:  Please fax a copy of CLEARANCE TO [facility] [phone] to the surgeon's office.  Signed, Jeanmarie Plant Cherrish Vitali CCMA  11/09/2021, 11:11 AM

## 2021-11-09 NOTE — Telephone Encounter (Signed)
   Name: Gary [name]  DOB: [birth year]  MRN: [id number]  Primary Cardiologist: Dr. Kirk Ruths  Chart reviewed as part of pre-operative protocol coverage. Because of [name]'s past medical history and time since last visit, he will require a follow-up telephone visit in order to better assess preoperative cardiovascular risk.  Pre-op covering staff: - Please schedule appointment and call patient to inform them. If patient already had an upcoming appointment within acceptable timeframe, please add "pre-op clearance" to the appointment notes so provider is aware. - Please contact requesting surgeon's office via preferred method (i.e, phone, fax) to inform them of need for appointment prior to surgery.  This message will also be routed to pharmacy pool and/or Dr Kirk Ruths for input on holding Eliquis as requested below so that this information is available to the clearing provider at time of patient's appointment.   Finis Bud, NP  11/09/2021, 11:51 AM

## 2021-11-09 NOTE — Telephone Encounter (Signed)
Patient with diagnosis of PAF(paroxysmal atrial fibrillation) on Eliquis 5 mg bid for anticoagulation.    Procedure: Colonoscopy  Date of procedure: 12/06/2021   CHA2DS2-VASc Score = 2   This indicates a 2.2% annual risk of stroke. The patient's score is based upon: CHF History: 0 HTN History: 1 Diabetes History: 0 Stroke History: 0 Vascular Disease History: 1 Age Score: 0 Gender Score: 0     CrCl : >100 (SrCr. 1.16 based on lab from 06/20/2021) Platelet count -164 (06/20/2021)    Per office protocol, patient can hold Eliquis  for 1-2 days prior to procedure.     **This guidance is not considered finalized until pre-operative APP has relayed final recommendations.**

## 2021-11-09 NOTE — Telephone Encounter (Signed)
Left message to call back for tele pre op appt 

## 2021-11-10 ENCOUNTER — Telehealth: Payer: Self-pay

## 2021-11-10 NOTE — Telephone Encounter (Signed)
  Patient Consent for Virtual Visit         Tavarion Babington has provided verbal consent on 11/10/2021 for a virtual visit (video or telephone).   CONSENT FOR VIRTUAL VISIT FOR:  [name]  By participating in this virtual visit I agree to the following:  I hereby voluntarily request, consent and authorize [facility] and its employed or contracted physicians, physician assistants, nurse practitioners or other licensed health care professionals (the Practitioner), to provide me with telemedicine health care services (the "Services") as deemed necessary by the treating Practitioner. I acknowledge and consent to receive the Services by the Practitioner via telemedicine. I understand that the telemedicine visit will involve communicating with the Practitioner through live audiovisual communication technology and the disclosure of certain medical information by electronic transmission. I acknowledge that I have been given the opportunity to request an in-person assessment or other available alternative prior to the telemedicine visit and am voluntarily participating in the telemedicine visit.  I understand that I have the right to withhold or withdraw my consent to the use of telemedicine in the course of my care at any time, without affecting my right to future care or treatment, and that the Practitioner or I may terminate the telemedicine visit at any time. I understand that I have the right to inspect all information obtained and/or recorded in the course of the telemedicine visit and may receive copies of available information for a reasonable fee.  I understand that some of the potential risks of receiving the Services via telemedicine include:  Delay or interruption in medical evaluation due to technological equipment failure or disruption; Information transmitted may not be sufficient (e.g. poor resolution of images) to allow for appropriate medical decision making by the Practitioner;  and/or  In rare instances, security protocols could fail, causing a breach of personal health information.  Furthermore, I acknowledge that it is my responsibility to provide information about my medical history, conditions and care that is complete and accurate to the best of my ability. I acknowledge that Practitioner's advice, recommendations, and/or decision may be based on factors not within their control, such as incomplete or inaccurate data provided by me or distortions of diagnostic images or specimens that may result from electronic transmissions. I understand that the practice of medicine is not an exact science and that Practitioner makes no warranties or guarantees regarding treatment outcomes. I acknowledge that a copy of this consent can be made available to me via my patient portal ([facility]), or I can request a printed copy by calling the office of [facility].    I understand that my insurance will be billed for this visit.   I have read or had this consent read to me. I understand the contents of this consent, which adequately explains the benefits and risks of the Services being provided via telemedicine.  I have been provided ample opportunity to ask questions regarding this consent and the Services and have had my questions answered to my satisfaction. I give my informed consent for the services to be provided through the use of telemedicine in my medical care

## 2021-11-10 NOTE — Telephone Encounter (Signed)
Patient scheduled for a tele visit on 11/15/21 for clearance

## 2021-11-15 ENCOUNTER — Ambulatory Visit: Payer: BC Managed Care – PPO | Attending: Internal Medicine | Admitting: Physician Assistant

## 2021-11-15 DIAGNOSIS — Z01810 Encounter for preprocedural cardiovascular examination: Secondary | ICD-10-CM | POA: Diagnosis not present

## 2021-11-15 DIAGNOSIS — Z23 Encounter for immunization: Secondary | ICD-10-CM | POA: Diagnosis not present

## 2021-11-15 DIAGNOSIS — T148XXA Other injury of unspecified body region, initial encounter: Secondary | ICD-10-CM | POA: Diagnosis not present

## 2021-11-15 NOTE — Progress Notes (Signed)
Virtual Visit via Telephone Note   Because of Gary [name]'s co-morbid illnesses, he is at least at moderate risk for complications without adequate follow up.  This format is felt to be most appropriate for this patient at this time.  The patient did not have access to video technology/had technical difficulties with video requiring transitioning to audio format only (telephone).  All issues noted in this document were discussed and addressed.  No physical exam could be performed with this format.  Please refer to the patient's chart for his consent to telehealth for [name].  Evaluation Performed:  Preoperative cardiovascular risk assessment _____________   Date:  11/15/2021   Patient ID:  Gary [name], DOB May 20, 1955, MRN [id number] Patient Location:  Home Provider location:   Office  Primary Care Provider:  Merrilee Seashore, MD Primary Cardiologist:  Kirk Ruths, MD  Chief Complaint / Patient Profile   66 y.o. y/o male with a h/o CAD, PAF, HTN who is pending colonoscopy and presents today for telephonic preoperative cardiovascular risk assessment.  Past Medical History    Past Medical History:  Diagnosis Date   Arthritis    CAD (coronary artery disease) 2015   CABGx6   Gout    Kidney stones    Obesity (BMI 30-39.9)    PAF (paroxysmal atrial fibrillation) (HCC)    on Eliquis   S/P CABG x 6    Sleep apnea    uses CPAP nightly   Past Surgical History:  Procedure Laterality Date   CORONARY ARTERY BYPASS GRAFT N/A 04/18/2013   Procedure: CORONARY ARTERY BYPASS GRAFTING (CABG) TIMES SIX USING LEFT INTERNAL MAMMARY ARTERY AND RIGHT SAPHENOUS LEG VEIN HARVESTED ENDOSCOPICALLY;  Surgeon: [name], MD;  Location: [place];  Service: Open Heart Surgery;  Laterality: N/A;   INTRAOPERATIVE TRANSESOPHAGEAL ECHOCARDIOGRAM N/A 04/18/2013   Procedure: INTRAOPERATIVE TRANSESOPHAGEAL ECHOCARDIOGRAM;  Surgeon: [name], MD;  Location: [place];  Service:  Open Heart Surgery;  Laterality: N/A;   KIDNEY STONE SURGERY     LEFT AND RIGHT HEART CATHETERIZATION WITH CORONARY ANGIOGRAM N/A 04/16/2013   Procedure: LEFT AND RIGHT HEART CATHETERIZATION WITH CORONARY ANGIOGRAM;  Surgeon: Jacolyn Reedy, MD;  Location: [facility] CATH LAB;  Service: Cardiovascular;  Laterality: N/A;   LESION EXCISION WITH COMPLEX REPAIR Right 08/17/2021   Procedure: LESION EXCISION OF RIGHT LEG WITH COMPLEX REPAIR AND APPLICATION WITH A CELL;  Surgeon: Crissie Reese, MD;  Location: [place];  Service: Plastics;  Laterality: Right;  LOCAL   NEPHROLITHOTOMY Right 05/28/2014   Procedure: NEPHROLITHOTOMY PERCUTANEOUS;  Surgeon: Franchot Gallo, MD;  Location: [facility];  Service: Urology;  Laterality: Right;    Allergies  Allergies  Allergen Reactions   Contrast Media [Iodinated Contrast Media] Hives, Shortness Of Breath and Nausea And Vomiting   Iodinated Contrast Media Other (See Comments)    History of Present Illness    Gary [name] is a 66 y.o. male who presents via audio/video conferencing for a telehealth visit today.  Pt was last seen in cardiology clinic on 06/17/21 by Dr. Stanford Breed.  At that time Gary [name] was doing well .  The patient is now pending procedure as outlined above. Since his last visit, he remains stable from a cardiac standpoint. No issues with chest pain or SOB. He states his Afib is stable and he gets a notification on his phone about every 45 days that he has an episode. He is on Eliquis for anticoagulation. Today he denies chest pain, shortness  of breath, lower extremity edema, fatigue, palpitations, presyncope, syncope, orthopnea, and PND. He has no issues with walking and can even run a short distance if he had to. He could do all his own indoor and outdoor work if needed however, he does have someone to help with these tasks. For this reason, he has scored a 7.34 mets on the DASI. This exceeds the 4 mets minimum requirement.   We discussed  holding Eliquis for 1-2 days prior to the procedure and to resume when medically safe to do so.   Home Medications    Prior to Admission medications   Medication Sig Start Date End Date Taking? Authorizing Provider  allopurinol (ZYLOPRIM) 100 MG tablet Take 100 mg by mouth daily.    [provider]  apixaban (ELIQUIS) 5 MG TABS tablet TAKE 1 TABLET BY MOUTH TWICE A DAY 01/17/21   Lelon Perla, MD  Ascorbic Acid (VITAMIN C) 1000 MG tablet Take 2,000 mg by mouth every morning.     [provider]  atorvastatin (LIPITOR) 80 MG tablet TAKE 1 TABLET BY MOUTH EVERY DAY AT [facility] 01/19/21   Lelon Perla, MD  mometasone (NASONEX) 50 MCG/ACT nasal spray Place 2 sprays into the nose daily. 10/30/16   Dohmeier, Asencion Partridge, MD  Multiple Vitamins-Minerals (CENTRUM SILVER PO) Take 1 tablet by mouth every morning.     [provider]  Polyvinyl Alcohol-Povidone (REFRESH OP) Place 1 drop into both eyes 2 (two) times daily as needed (dry eyes).    [provider]  tamsulosin (FLOMAX) 0.4 MG CAPS capsule Take 0.4 mg by mouth every morning.    [provider]  triamcinolone (NASACORT) 55 MCG/ACT AERO nasal inhaler 1 spray in each nostril 10/05/17   [provider]    Physical Exam    Vital Signs:  Gary [name] does not have vital signs available for review today.  Given telephonic nature of communication, physical exam is limited. AAOx3. NAD. Normal affect.  Speech and respirations are unlabored.  Accessory Clinical Findings    None  Assessment & Plan    1.  Preoperative Cardiovascular Risk Assessment: Colonoscopy, Dr. Acie Fredrickson GI  Gary [name]'s perioperative risk of a major cardiac event is 0.9% according to the Revised Cardiac Risk Index (RCRI).  Therefore, he is at low risk for perioperative complications.   His functional capacity is excellent at 7.34 METs according to the [facility] Activity Status Index (DASI). Recommendations: According to  ACC/AHA guidelines, no further cardiovascular testing needed.  The patient may proceed to surgery at acceptable risk.   Antiplatelet and/or Anticoagulation Recommendations: Per office protocol, patient can hold Eliquis  for 1-2 days prior to procedure.    Primary Cardiologist: Kirk Ruths, MD  Patient was advised that if he develops new symptoms prior to surgery to contact our office to arrange a follow-up appointment.  He verbalized understanding.  I will route this recommendation to the requesting party via Epic fax function and remove from pre-op pool.  Please call with questions.   Time:   Today, I have spent 5 minutes with the patient with telehealth technology discussing medical history, symptoms, and management plan.     Elgie Collard, PA-C   11/15/2021, 7:26 AM

## 2021-12-06 ENCOUNTER — Other Ambulatory Visit: Payer: Self-pay | Admitting: Cardiology

## 2021-12-06 DIAGNOSIS — I480 Paroxysmal atrial fibrillation: Secondary | ICD-10-CM

## 2021-12-06 NOTE — Telephone Encounter (Signed)
Prescription refill request for Eliquis received. Indication:afib Last office visit:11/23 Scr:1.1 Age: 66 Weight:119.8  kg  Prescription refilled

## 2021-12-22 ENCOUNTER — Other Ambulatory Visit: Payer: Self-pay | Admitting: Cardiology

## 2021-12-22 DIAGNOSIS — E7800 Pure hypercholesterolemia, unspecified: Secondary | ICD-10-CM

## 2022-01-23 ENCOUNTER — Other Ambulatory Visit: Payer: Self-pay | Admitting: *Deleted

## 2022-01-23 DIAGNOSIS — R0989 Other specified symptoms and signs involving the circulatory and respiratory systems: Secondary | ICD-10-CM

## 2022-01-25 ENCOUNTER — Encounter (HOSPITAL_COMMUNITY): Payer: Self-pay

## 2022-01-25 ENCOUNTER — Encounter: Payer: Self-pay | Admitting: Cardiology

## 2022-02-02 ENCOUNTER — Other Ambulatory Visit: Payer: Self-pay | Admitting: Cardiology

## 2022-02-02 DIAGNOSIS — R0989 Other specified symptoms and signs involving the circulatory and respiratory systems: Secondary | ICD-10-CM

## 2022-02-06 ENCOUNTER — Ambulatory Visit (HOSPITAL_COMMUNITY)
Admission: RE | Admit: 2022-02-06 | Discharge: 2022-02-06 | Disposition: A | Discharge status: Home / Self Care | Payer: Medicare Other | Source: Ambulatory Visit | Attending: Cardiovascular Disease | Admitting: Cardiovascular Disease

## 2022-02-06 ENCOUNTER — Other Ambulatory Visit: Payer: Self-pay | Admitting: Cardiology

## 2022-02-06 DIAGNOSIS — Z136 Encounter for screening for cardiovascular disorders: Secondary | ICD-10-CM | POA: Diagnosis present

## 2022-02-06 DIAGNOSIS — Z951 Presence of aortocoronary bypass graft: Secondary | ICD-10-CM | POA: Insufficient documentation

## 2022-02-06 DIAGNOSIS — I10 Essential (primary) hypertension: Secondary | ICD-10-CM | POA: Insufficient documentation

## 2022-02-06 DIAGNOSIS — R0989 Other specified symptoms and signs involving the circulatory and respiratory systems: Secondary | ICD-10-CM

## 2022-02-06 DIAGNOSIS — I2510 Atherosclerotic heart disease of native coronary artery without angina pectoris: Secondary | ICD-10-CM | POA: Diagnosis not present

## 2022-02-06 DIAGNOSIS — E785 Hyperlipidemia, unspecified: Secondary | ICD-10-CM | POA: Diagnosis not present

## 2022-02-06 DIAGNOSIS — Z87891 Personal history of nicotine dependence: Secondary | ICD-10-CM | POA: Insufficient documentation

## 2022-02-06 DIAGNOSIS — Z79891 Long term (current) use of opiate analgesic: Secondary | ICD-10-CM | POA: Insufficient documentation

## 2022-02-07 ENCOUNTER — Other Ambulatory Visit: Payer: Self-pay | Admitting: *Deleted

## 2022-02-07 DIAGNOSIS — I723 Aneurysm of iliac artery: Secondary | ICD-10-CM

## 2022-02-27 ENCOUNTER — Other Ambulatory Visit (HOSPITAL_COMMUNITY): Payer: Self-pay | Admitting: Cardiology

## 2022-02-27 DIAGNOSIS — I723 Aneurysm of iliac artery: Secondary | ICD-10-CM

## 2022-06-18 ENCOUNTER — Other Ambulatory Visit: Payer: Self-pay | Admitting: Cardiology

## 2022-06-18 DIAGNOSIS — I480 Paroxysmal atrial fibrillation: Secondary | ICD-10-CM

## 2022-06-19 NOTE — Telephone Encounter (Signed)
Prescription refill request for Eliquis received. Indication:afib Last office visit:11/23 Scr:1.16  6/23 Age: 67 Weight:119.8  kg  Prescription refilled

## 2022-07-11 NOTE — Progress Notes (Signed)
HPI: Follow-up coronary artery disease and atrial fibrillation. Patient had coronary artery bypass and graft in April 2015 with LIMA to the LAD, saphenous vein graft to the diagonal, saphenous vein graft to the first and second marginal and saphenous vein graft to the acute marginal and distal circumflex. PreCABG dopplers showed 1-39 bilateral stenosis. Patient noted to have palpitations and rhythm strip on his apple watch showed atrial fibrillation. Started on apixaban. Echocardiogram September 2021 showed ejection fraction 50 to 55% and dilated ascending aorta at 40 mm. Monitor September 2021 showed sinus rhythm with occasional PAC, brief PAT, occasional PVC and rare couplet. Abdominal ultrasound January 2024 showed no aneurysm; there was note of mildly dilated external iliac arteries and right common iliac artery with largest being left external iliac measuring 1.5 cm.  Since last seen, the patient denies any dyspnea on exertion, orthopnea, PND, pedal edema, palpitations, syncope or chest pain.   Current Outpatient Medications  Medication Sig Dispense Refill   allopurinol (ZYLOPRIM) 100 MG tablet Take 100 mg by mouth daily.     apixaban (ELIQUIS) 5 MG TABS tablet TAKE 1 TABLET BY MOUTH TWICE A DAY 60 tablet 5   Ascorbic Acid (VITAMIN C) 1000 MG tablet Take 2,000 mg by mouth every morning.      atorvastatin (LIPITOR) 80 MG tablet TAKE 1 TABLET BY MOUTH EVERY DAY AT 6PM 90 tablet 3   mometasone (NASONEX) 50 MCG/ACT nasal spray Place 2 sprays into the nose daily. 17 g 12   Multiple Vitamins-Minerals (CENTRUM SILVER PO) Take 1 tablet by mouth every morning.      Polyvinyl Alcohol-Povidone (REFRESH OP) Place 1 drop into both eyes 2 (two) times daily as needed (dry eyes).     tamsulosin (FLOMAX) 0.4 MG CAPS capsule Take 0.4 mg by mouth every morning.     triamcinolone (NASACORT) 55 MCG/ACT AERO nasal inhaler 1 spray in each nostril     No current facility-administered medications for this visit.      Past Medical History:  Diagnosis Date   Arthritis    CAD (coronary artery disease) 2015   CABGx6   Gout    Kidney stones    Obesity (BMI 30-39.9)    PAF (paroxysmal atrial fibrillation) (HCC)    on Eliquis   S/P CABG x 6    Sleep apnea    uses CPAP nightly    Past Surgical History:  Procedure Laterality Date   CORONARY ARTERY BYPASS GRAFT N/A 04/18/2013   Procedure: CORONARY ARTERY BYPASS GRAFTING (CABG) TIMES SIX USING LEFT INTERNAL MAMMARY ARTERY AND RIGHT SAPHENOUS LEG VEIN HARVESTED ENDOSCOPICALLY;  Surgeon: Delight Ovens, MD;  Location: [facility] OR;  Service: Open Heart Surgery;  Laterality: N/A;   INTRAOPERATIVE TRANSESOPHAGEAL ECHOCARDIOGRAM N/A 04/18/2013   Procedure: INTRAOPERATIVE TRANSESOPHAGEAL ECHOCARDIOGRAM;  Surgeon: Delight Ovens, MD;  Location: [facility] OR;  Service: Open Heart Surgery;  Laterality: N/A;   KIDNEY STONE SURGERY     LEFT AND RIGHT HEART CATHETERIZATION WITH CORONARY ANGIOGRAM N/A 04/16/2013   Procedure: LEFT AND RIGHT HEART CATHETERIZATION WITH CORONARY ANGIOGRAM;  Surgeon: Othella Boyer, MD;  Location: [facility] CATH LAB;  Service: Cardiovascular;  Laterality: N/A;   LESION EXCISION WITH COMPLEX REPAIR Right 08/17/2021   Procedure: LESION EXCISION OF RIGHT LEG WITH COMPLEX REPAIR AND APPLICATION WITH A CELL;  Surgeon: Etter Sjogren, MD;  Location: [facility];  Service: Plastics;  Laterality: Right;  LOCAL   NEPHROLITHOTOMY Right 05/28/2014   Procedure: NEPHROLITHOTOMY PERCUTANEOUS;  Surgeon:  Marcine Matar, MD;  Location[facility];  Service: Urology;  Laterality: Right;    Social History   Socioeconomic History   Marital status: Married    Spouse name: Not on file   Number of children: Not on file   Years of education: Not on file   Highest education level: Not on file  Occupational History   Not on file  Tobacco Use   Smoking status: Former    Current packs/day: 0.00    Types: Cigarettes    Start date: 05/12/1969    Quit date:  05/13/1994    Years since quitting: 28.2   Smokeless tobacco: Never  Vaping Use   Vaping status: Never Used  Substance and Sexual Activity   Alcohol use: Yes    Comment: occasional   Drug use: No   Sexual activity: Yes  Other Topics Concern   Not on file  Social History Narrative   ** Merged History Encounter **       Social Determinants of Health   Financial Resource Strain: Not on file  Food Insecurity: Not on file  Transportation Needs: Not on file  Physical Activity: Not on file  Stress: Not on file  Social Connections: Not on file  Intimate Partner Violence: Not on file    Family History  Problem Relation Age of Onset   Heart attack Father    Pancreatic cancer Father    Melanoma Brother     ROS: no fevers or chills, productive cough, hemoptysis, dysphasia, odynophagia, melena, hematochezia, dysuria, hematuria, rash, seizure activity, orthopnea, PND, pedal edema, claudication. Remaining systems are negative.  Physical Exam: Well-developed well-nourished in no acute distress.  Skin is warm and dry.  HEENT is normal.  Neck is supple.  Chest is clear to auscultation with normal expansion.  Cardiovascular exam is regular rate and rhythm.  Abdominal exam nontender or distended. No masses palpated. Extremities show no edema. neuro grossly intact  EKG Interpretation Date/Time:  Monday July 24 2022 07:57:28 EDT Ventricular Rate:  61 PR Interval:  170 QRS Duration:  78 QT Interval:  414 QTC Calculation: 416 R Axis:   40  Text Interpretation: Sinus rhythm with Premature supraventricular complexes Nonspecific T wave abnormality When compared with ECG of 08-May-2013 04:33, Premature supraventricular complexes are now Present Confirmed by Olga Millers ([id number]) on 07/24/2022 8:00:03 AM    A/P  1 paroxysmal atrial fibrillation-he is in sinus rhythm today.  Continue apixaban.  He does occasionally have episodes of atrial fibrillation that he notes via his Apple Watch.   However he is not symptomatic.  He is in sinus rhythm today.  Would favor continued medical therapy.  Can consider addition of antiarrhythmic versus ablation in the future if he has symptomatic atrial fibrillation.  2 coronary artery disease-continue statin.  He is not on aspirin given need for apixaban.  3 hypertension-blood pressure mildly elevated.  I have asked him to track this and we will adjust regimen if needed.  4 hyperlipidemia-continue statin.  5 minimally dilated iliac arteries-we will plan follow-up ultrasound in 3 years.  6 sleep apnea-continue CPAP.  Olga Millers, MD

## 2022-07-17 ENCOUNTER — Ambulatory Visit: Payer: Medicare Other | Admitting: Cardiology

## 2022-07-24 ENCOUNTER — Encounter: Payer: Self-pay | Admitting: Cardiology

## 2022-07-24 ENCOUNTER — Ambulatory Visit: Payer: Medicare Other | Attending: Cardiology | Admitting: Cardiology

## 2022-07-24 VITALS — BP 142/84 | HR 61 | Ht 74.0 in | Wt 271.2 lb

## 2022-07-24 DIAGNOSIS — I10 Essential (primary) hypertension: Secondary | ICD-10-CM | POA: Diagnosis present

## 2022-07-24 DIAGNOSIS — E7800 Pure hypercholesterolemia, unspecified: Secondary | ICD-10-CM | POA: Insufficient documentation

## 2022-07-24 DIAGNOSIS — I2510 Atherosclerotic heart disease of native coronary artery without angina pectoris: Secondary | ICD-10-CM | POA: Diagnosis present

## 2022-07-24 DIAGNOSIS — I723 Aneurysm of iliac artery: Secondary | ICD-10-CM | POA: Insufficient documentation

## 2022-07-24 DIAGNOSIS — I480 Paroxysmal atrial fibrillation: Secondary | ICD-10-CM | POA: Diagnosis present

## 2022-07-24 NOTE — Patient Instructions (Signed)

## 2022-11-08 ENCOUNTER — Telehealth: Payer: Self-pay | Admitting: *Deleted

## 2022-11-08 NOTE — Telephone Encounter (Signed)
Pre-operative Risk Assessment    Patient Name: Alben Kosier  DOB: December 13, 1955 MRN: [id number]  DATE OF LAST VISIT: 07/24/22 DR. CRENSHAW DATE OF NEXT VISIT: NONE    Request for Surgical Clearance    Procedure:   RIGHT TOTAL ANTERIOR HIP ARTHROPLASTY  Date of Surgery:  Clearance TBD                                 Surgeon:  DR. Marcene Corning Surgeon's Group or Practice Name:  [facility] Phone number:  [phone] ATTN: REBECCA LANG Fax number:  [phone]   Type of Clearance Requested:   - Medical  - Pharmacy:  Hold Apixaban (Eliquis)     Type of Anesthesia:  Spinal   Additional requests/questions:    Elpidio Anis   11/08/2022, 6:19 PM

## 2022-11-09 NOTE — Telephone Encounter (Signed)
Please advise holding Eliquis prior to right total anterior hip arthroplasty.   Thank you!  DW

## 2022-11-10 ENCOUNTER — Telehealth: Payer: Self-pay

## 2022-11-10 NOTE — Telephone Encounter (Signed)
Patient with diagnosis of A Fib on Eliquis for anticoagulation.    Procedure:  RIGHT TOTAL ANTERIOR HIP ARTHROPLASTY  Date of procedure: TBD   CHA2DS2-VASc Score = 3  This indicates a 3.2% annual risk of stroke. The patient's score is based upon: CHF History: 0 HTN History: 1 Diabetes History: 0 Stroke History: 0 Vascular Disease History: 1 Age Score: 1 Gender Score: 0   CrCl 98 ml/min using adj body weight Platelet count 216K   Per office protocol, patient can hold Eliquis for 3 days prior to procedure.    **This guidance is not considered finalized until pre-operative APP has relayed final recommendations.**

## 2022-11-10 NOTE — Telephone Encounter (Signed)
Called and spoke to patient scheduled tele visit on 11/15 at 9:40 am patient voiced understanding med rec and consent done.Marland Kitchen SN

## 2022-11-10 NOTE — Telephone Encounter (Signed)
Called and spoke to patient scheduled tele visit on 11/15 at 9:40 am patient voiced understanding med rec and consent done.. SN     Patient Consent for Virtual Visit        [name] has provided verbal consent on 11/10/2022 for a virtual visit (video or telephone).   CONSENT FOR VIRTUAL VISIT FOR:  Gary [name]  By participating in this virtual visit I agree to the following:  I hereby voluntarily request, consent and authorize [facility] and its employed or contracted physicians, physician assistants, nurse practitioners or other licensed health care professionals (the Practitioner), to provide me with telemedicine health care services (the "Services") as deemed necessary by the treating Practitioner. I acknowledge and consent to receive the Services by the Practitioner via telemedicine. I understand that the telemedicine visit will involve communicating with the Practitioner through live audiovisual communication technology and the disclosure of certain medical information by electronic transmission. I acknowledge that I have been given the opportunity to request an in-person assessment or other available alternative prior to the telemedicine visit and am voluntarily participating in the telemedicine visit.  I understand that I have the right to withhold or withdraw my consent to the use of telemedicine in the course of my care at any time, without affecting my right to future care or treatment, and that the Practitioner or I may terminate the telemedicine visit at any time. I understand that I have the right to inspect all information obtained and/or recorded in the course of the telemedicine visit and may receive copies of available information for a reasonable fee.  I understand that some of the potential risks of receiving the Services via telemedicine include:  Delay or interruption in medical evaluation due to technological equipment failure or disruption; Information  transmitted may not be sufficient (e.g. poor resolution of images) to allow for appropriate medical decision making by the Practitioner; and/or  In rare instances, security protocols could fail, causing a breach of personal health information.  Furthermore, I acknowledge that it is my responsibility to provide information about my medical history, conditions and care that is complete and accurate to the best of my ability. I acknowledge that Practitioner's advice, recommendations, and/or decision may be based on factors not within their control, such as incomplete or inaccurate data provided by me or distortions of diagnostic images or specimens that may result from electronic transmissions. I understand that the practice of medicine is not an exact science and that Practitioner makes no warranties or guarantees regarding treatment outcomes. I acknowledge that a copy of this consent can be made available to me via my patient portal [facility] MyChart), or I can request a printed copy by calling the office of [facility].    I understand that my insurance will be billed for this visit.   I have read or had this consent read to me. I understand the contents of this consent, which adequately explains the benefits and risks of the Services being provided via telemedicine.  I have been provided ample opportunity to ask questions regarding this consent and the Services and have had my questions answered to my satisfaction. I give my informed consent for the services to be provided through the use of telemedicine in my medical care

## 2022-11-10 NOTE — Telephone Encounter (Signed)
Left message to call back to set up tele appt

## 2022-11-10 NOTE — Telephone Encounter (Signed)
   Name: Gary [name]  DOB: [birth year]  MRN: [id number]  Primary Cardiologist: Olga Millers, MD   Preoperative team, please contact this patient and set up a phone call appointment for further preoperative risk assessment. Please obtain consent and complete medication review. Thank you for your help.  I confirm that guidance regarding antiplatelet and oral anticoagulation therapy has been completed and, if necessary, noted below.  Per Pharm D, patient may hold Eliquis for 3 days prior to procedure.    I also confirmed the patient resides in the state of [place]. As per [facility] Medical Board telemedicine laws, the patient must reside in the state in which the provider is licensed.   Carlos Levering, NP 11/10/2022, 10:57 AM [facility]

## 2022-11-10 NOTE — Telephone Encounter (Signed)
Patient returned pre-op call.

## 2022-11-24 ENCOUNTER — Ambulatory Visit: Payer: Medicare Other | Attending: Internal Medicine | Admitting: Nurse Practitioner

## 2022-11-24 DIAGNOSIS — Z01810 Encounter for preprocedural cardiovascular examination: Secondary | ICD-10-CM

## 2022-11-24 NOTE — Progress Notes (Signed)
Virtual Visit via Telephone Note   Because of Gary [name]'s co-morbid illnesses, he is at least at moderate risk for complications without adequate follow up.  This format is felt to be most appropriate for this patient at this time.  The patient did not have access to video technology/had technical difficulties with video requiring transitioning to audio format only (telephone).  All issues noted in this document were discussed and addressed.  No physical exam could be performed with this format.  Please refer to the patient's chart for his consent to telehealth for [facility].  Evaluation Performed:  Preoperative cardiovascular risk assessment _____________   Date:  11/24/2022   Patient ID:  Gary [name], DOB [birth year], MRN [id number] Patient Location:  Home Provider location:   Office  Primary Care Provider:  Georgianne Fick, MD Primary Cardiologist:  Gary Millers, MD  Chief Complaint / Patient Profile   67 y.o. y/o male with a h/o CAD s/p CABG x 6 (LIMA-LAD, SVG-diagonal, SVG-OM1 and OM2, SVG-acute marginal, SVG-distal circumflex), mild carotid artery stenosis, paroxysmal atrial fibrillation on Eliquis, hypertension, hyperlipidemia, minimally dilated iliac arteries, and OSA who is pending right total anterior hip arthroplasty with Dr. Marcene [name] of [facility] and presents today for telephonic preoperative cardiovascular risk assessment.  History of Present Illness    Gary [name] is a 67 y.o. male who presents via audio/video conferencing for a telehealth visit today.  Pt was last seen in cardiology clinic on 07/24/2022 by Dr. Jens [name]. At that time Gary [name] was doing well.  The patient is now pending procedure as outlined above. Since his last visit, he has done well from a cardiac standpoint.  His activity is somewhat limited in the setting of hip pain.  He denies chest pain, palpitations, dyspnea, pnd, orthopnea, n, v, dizziness, syncope,  edema, weight gain, or early satiety. All other systems reviewed and are otherwise negative except as noted above.   Past Medical History    Past Medical History:  Diagnosis Date   Arthritis    CAD (coronary artery disease) 2015   CABGx6   Gout    Kidney stones    Obesity (BMI 30-39.9)    PAF (paroxysmal atrial fibrillation) (HCC)    on Eliquis   S/P CABG x 6    Sleep apnea    uses CPAP nightly   Past Surgical History:  Procedure Laterality Date   CORONARY ARTERY BYPASS GRAFT N/A 04/18/2013   Procedure: CORONARY ARTERY BYPASS GRAFTING (CABG) TIMES SIX USING LEFT INTERNAL MAMMARY ARTERY AND RIGHT SAPHENOUS LEG VEIN HARVESTED ENDOSCOPICALLY;  Surgeon: Gary Ovens, MD;  Location: [facility] OR;  Service: Open Heart Surgery;  Laterality: N/A;   INTRAOPERATIVE TRANSESOPHAGEAL ECHOCARDIOGRAM N/A 04/18/2013   Procedure: INTRAOPERATIVE TRANSESOPHAGEAL ECHOCARDIOGRAM;  Surgeon: Gary Ovens, MD;  Location: [facility] OR;  Service: Open Heart Surgery;  Laterality: N/A;   KIDNEY STONE SURGERY     LEFT AND RIGHT HEART CATHETERIZATION WITH CORONARY ANGIOGRAM N/A 04/16/2013   Procedure: LEFT AND RIGHT HEART CATHETERIZATION WITH CORONARY ANGIOGRAM;  Surgeon: Gary Boyer, MD;  Location: [facility] CATH LAB;  Service: Cardiovascular;  Laterality: N/A;   LESION EXCISION WITH COMPLEX REPAIR Right 08/17/2021   Procedure: LESION EXCISION OF RIGHT LEG WITH COMPLEX REPAIR AND APPLICATION WITH A CELL;  Surgeon: Gary Sjogren, MD;  Location: [facility];  Service: Plastics;  Laterality: Right;  LOCAL   NEPHROLITHOTOMY Right 05/28/2014   Procedure: NEPHROLITHOTOMY PERCUTANEOUS;  Surgeon: Gary Matar, MD;  Location: [facility] ORS;  Service: Urology;  Laterality: Right;    Allergies  Allergies  Allergen Reactions   Contrast Media [Iodinated Contrast Media] Hives, Shortness Of Breath and Nausea And Vomiting   Iodinated Contrast Media Other (See Comments)    Home Medications    Prior to Admission  medications   Medication Sig Start Date End Date Taking? Authorizing Provider  allopurinol (ZYLOPRIM) 100 MG tablet Take 100 mg by mouth daily.    [provider]  apixaban (ELIQUIS) 5 MG TABS tablet TAKE 1 TABLET BY MOUTH TWICE A DAY 06/19/22   Gary Bunting, MD  Ascorbic Acid (VITAMIN C) 1000 MG tablet Take 2,000 mg by mouth every morning.     [provider]  atorvastatin (LIPITOR) 80 MG tablet TAKE 1 TABLET BY MOUTH EVERY DAY AT 6PM 12/22/21   Gary Bunting, MD  mometasone (NASONEX) 50 MCG/ACT nasal spray Place 2 sprays into the nose daily. 10/30/16   Dohmeier, Porfirio Mylar, MD  Multiple Vitamins-Minerals (CENTRUM SILVER PO) Take 1 tablet by mouth every morning.     [provider]  Polyvinyl Alcohol-Povidone (REFRESH OP) Place 1 drop into both eyes 2 (two) times daily as needed (dry eyes).    [provider]  tamsulosin (FLOMAX) 0.4 MG CAPS capsule Take 0.4 mg by mouth every morning.    [provider]  triamcinolone (NASACORT) 55 MCG/ACT AERO nasal inhaler 1 spray in each nostril 10/05/17   [provider]    Physical Exam    Vital Signs:  Gary [name] does not have vital signs available for review today.  Given telephonic nature of communication, physical exam is limited. AAOx3. NAD. Normal affect.  Speech and respirations are unlabored.  Accessory Clinical Findings    None  Assessment & Plan    1.  Preoperative Cardiovascular Risk Assessment:  According to the Revised Cardiac Risk Index (RCRI), his Perioperative Risk of Major Cardiac Event is (%): 0.9. His Functional Capacity in METs is: 7.01 according to the [facility] Activity Status Index (DASI). Therefore, based on ACC/AHA guidelines, patient would be at acceptable risk for the planned procedure without further cardiovascular testing.   The patient was advised that if he develops new symptoms prior to surgery to contact our office to arrange for a follow-up visit, and he  verbalized understanding.  Per office protocol, patient can hold Eliquis for 3 days prior to procedure.  Please resume Eliquis as soon as possible postprocedure, at the discretion of the surgeon.   A copy of this note will be routed to requesting surgeon.  Time:   Today, I have spent 5 minutes with the patient with telehealth technology discussing medical history, symptoms, and management plan.     Joylene Grapes, NP  11/24/2022, 9:49 AM

## 2022-11-28 ENCOUNTER — Other Ambulatory Visit: Payer: Self-pay | Admitting: Orthopaedic Surgery

## 2022-12-01 ENCOUNTER — Encounter: Payer: Self-pay | Admitting: Cardiology

## 2022-12-05 NOTE — Patient Instructions (Signed)
SURGICAL WAITING ROOM VISITATION Patients having surgery or a procedure may have no more than 2 support people in the waiting area - these visitors may rotate in the visitor waiting room.   Due to an increase in RSV and influenza rates and associated hospitalizations, children ages 28 and under may not visit patients in [facility] hospitals. If the patient needs to stay at the hospital during part of their recovery, the visitor guidelines for inpatient rooms apply.  PRE-OP VISITATION  Pre-op nurse will coordinate an appropriate time for 1 support person to accompany the patient in pre-op.  This support person may not rotate.  This visitor will be contacted when the time is appropriate for the visitor to come back in the pre-op area.  Please refer to the [facility] website for the visitor guidelines for Inpatients (after your surgery is over and you are in a regular room).  You are not required to quarantine at this time prior to your surgery. However, you must do this: Hand Hygiene often Do NOT share personal items Notify your provider if you are in close contact with someone who has COVID or you develop fever 100.4 or greater, new onset of sneezing, cough, sore throat, shortness of breath or body aches.  If you test positive for Covid or have been in contact with anyone that has tested positive in the last 10 days please notify you surgeon.    Your procedure is scheduled on:  TUESDAY  December 19, 2022  Report to [facility] Main Entrance: [facility] entrance where the [employer] is available.   Report to admitting at: 07:15    AM  Call this number if you have any questions or problems the morning of surgery [phone]  Do not eat food after Midnight the night prior to your surgery/procedure.  After Midnight you may have the following liquids until   06:45 AM DAY OF SURGERY  Clear Liquid Diet Water Black Coffee (sugar ok, NO MILK/CREAM OR CREAMERS)  Tea (sugar ok, NO  MILK/CREAM OR CREAMERS) regular and decaf                             Plain Jell-O  with no fruit (NO RED)                                           Fruit ices (not with fruit pulp, NO RED)                                     Popsicles (NO RED)                                                                  Juice: NO CITRUS JUICES: only apple, WHITE grape, WHITE cranberry Sports drinks like Gatorade or Powerade (NO RED)                   The day of surgery:  Drink ONE (1) Pre-Surgery Clear Ensure at   06:45  AM the morning of surgery. Drink in one sitting. Do not sip.  This drink was given to you during your hospital pre-op appointment visit. Nothing else to drink after completing the Pre-Surgery Clear Ensure : No candy, chewing gum or throat lozenges.    FOLLOW ANY ADDITIONAL PRE OP INSTRUCTIONS YOU RECEIVED FROM YOUR SURGEON'S OFFICE!!!   Oral Hygiene is also important to reduce your risk of infection.        Remember - BRUSH YOUR TEETH THE MORNING OF SURGERY WITH YOUR REGULAR TOOTHPASTE  Do NOT smoke after Midnight the night before surgery.  STOP TAKING all Vitamins, Herbs and supplements 1 week before your surgery.   ELIQUIS- STOP TAKING ELIQUIS 3 days before your surgery. Last dose will be taken on Friday December 15, 2022  Take ONLY these medicines the morning of surgery with A SIP OF WATER: Tamsulosin (Flomax), allopurinol.  You may use your Eye drops if needed.    If You have been diagnosed with Sleep Apnea - Bring CPAP mask and tubing day of surgery. We will provide you with a CPAP machine on the day of your surgery.                   You may not have any metal on your body including hair pins, jewelry, and body piercing  Do not wear make-up, lotions, powders, perfumes or deodorant  Do not wear nail polish including gel and S&S, artificial / acrylic nails, or any other type of covering on natural nails including finger and toenails. If you have artificial nails, gel  coating, etc., that needs to be removed by a nail salon, Please have this removed prior to surgery. Not doing so may mean that your surgery could be cancelled or delayed if the Surgeon or anesthesia staff feels like they are unable to monitor you safely.   Do not shave 48 hours prior to surgery to avoid nicks in your skin which may contribute to postoperative infections.   Contacts, Hearing Aids, dentures or bridgework may not be worn into surgery. DENTURES WILL BE REMOVED PRIOR TO SURGERY PLEASE DO NOT APPLY "Poly grip" OR ADHESIVES!!!   Patients discharged on the day of surgery will not be allowed to drive home.  Someone NEEDS to stay with you for the first 24 hours after anesthesia.  Do not bring your home medications to the hospital. The Pharmacy will dispense medications listed on your medication list to you during your admission in the Hospital.  Special Instructions: Bring a copy of your healthcare power of attorney and living will documents the day of surgery, if you wish to have them scanned into your [facility]- EPIC  Please read over the following fact sheets you were given: IF YOU HAVE QUESTIONS ABOUT YOUR PRE-OP INSTRUCTIONS, PLEASE CALL [phone].     Pre-operative 5 CHG Bath Instructions   You can play a key role in reducing the risk of infection after surgery. Your skin needs to be as free of germs as possible. You can reduce the number of germs on your skin by washing with CHG (chlorhexidine gluconate) soap before surgery. CHG is an antiseptic soap that kills germs and continues to kill germs even after washing.   DO NOT use if you have an allergy to chlorhexidine/CHG or antibacterial soaps. If your skin becomes reddened or irritated, stop using the CHG and notify one of our RNs at [phone]  Please shower with the CHG soap starting 4 days before surgery  using the following schedule: START SHOWERS ON  FRIDAY December 15, 2022                                                                                                                                                                                       Please keep in mind the following:  DO NOT shave, including legs and underarms, starting the day of your first shower.   You may shave your face at any point before/day of surgery.   Place clean sheets on your bed the day you start using CHG soap. Use a clean washcloth (not used since being washed) for each shower. DO NOT sleep with pets once you start using the CHG.   CHG Shower Instructions:  If you choose to wash your hair and private area, wash first with your normal shampoo/soap.  After you use shampoo/soap, rinse your hair and body thoroughly to remove shampoo/soap residue.  Turn the water OFF and apply about 3 tablespoons (45 ml) of CHG soap to a CLEAN washcloth.  Apply CHG soap ONLY FROM YOUR NECK DOWN TO YOUR TOES (washing for 3-5 minutes)  DO NOT use CHG soap on face, private areas, open wounds, or sores.  Pay special attention to the area where your surgery is being performed.  If you are having back surgery, having someone wash your back for you may be helpful.  Wait 2 minutes after CHG soap is applied, then you may rinse off the CHG soap.  Pat dry with a clean towel  Put on clean clothes/pajamas   If you choose to wear lotion, please use ONLY the CHG-compatible lotions on the back of this paper.     Additional instructions for the day of surgery: DO NOT APPLY any lotions, deodorants, cologne, or perfumes.   Put on clean/comfortable clothes.  Brush your teeth.  Ask your nurse before applying any prescription medications to the skin.      CHG Compatible Lotions   Aveeno Moisturizing lotion  Cetaphil Moisturizing Cream  Cetaphil Moisturizing Lotion  Clairol Herbal Essence Moisturizing Lotion, Dry Skin  Clairol Herbal Essence Moisturizing Lotion, Extra Dry Skin  Clairol Herbal Essence Moisturizing Lotion, Normal  Skin  Curel Age Defying Therapeutic Moisturizing Lotion with Alpha Hydroxy  Curel Extreme Care Body Lotion  Curel Soothing Hands Moisturizing Hand Lotion  Curel Therapeutic Moisturizing Cream, Fragrance-Free  Curel Therapeutic Moisturizing Lotion, Fragrance-Free  Curel Therapeutic Moisturizing Lotion, Original Formula  Eucerin Daily Replenishing Lotion  Eucerin Dry Skin Therapy Plus Alpha Hydroxy Crme  Eucerin Dry Skin Therapy Plus Alpha Hydroxy Lotion  Eucerin Original Crme  Eucerin Original Lotion  Eucerin Plus Crme Eucerin Plus Lotion  Eucerin TriLipid Replenishing Lotion  Keri Anti-Bacterial Hand Lotion  Keri Deep Conditioning Original Lotion Dry Skin Formula Softly Scented  Keri Deep Conditioning Original Lotion, Fragrance Free Sensitive Skin Formula  Keri Lotion Fast Absorbing Fragrance Free Sensitive Skin Formula  Keri Lotion Fast Absorbing Softly Scented Dry Skin Formula  Keri Original Lotion  Keri Skin Renewal Lotion Keri Silky Smooth Lotion  Keri Silky Smooth Sensitive Skin Lotion  Nivea Body Creamy Conditioning Oil  Nivea Body Extra Enriched Lotion  Nivea Body Original Lotion  Nivea Body Sheer Moisturizing Lotion Nivea Crme  Nivea Skin Firming Lotion  NutraDerm 30 Skin Lotion  NutraDerm Skin Lotion  NutraDerm Therapeutic Skin Cream  NutraDerm Therapeutic Skin Lotion  ProShield Protective Hand Cream  Provon moisturizing lotion   FAILURE TO FOLLOW THESE INSTRUCTIONS MAY RESULT IN THE CANCELLATION OF YOUR SURGERY  PATIENT SIGNATURE_________________________________  NURSE SIGNATURE__________________________________  ________________________________________________________________________      Rogelia Mire    An incentive spirometer is a tool that can help keep your lungs clear and active. This tool measures how well you are filling your lungs with each breath. Taking long deep breaths may help reverse or decrease the chance of developing breathing  (pulmonary) problems (especially infection) following: A long period of time when you are unable to move or be active. BEFORE THE PROCEDURE  If the spirometer includes an indicator to show your best effort, your nurse or respiratory therapist will set it to a desired goal. If possible, sit up straight or lean slightly forward. Try not to slouch. Hold the incentive spirometer in an upright position. INSTRUCTIONS FOR USE  Sit on the edge of your bed if possible, or sit up as far as you can in bed or on a chair. Hold the incentive spirometer in an upright position. Breathe out normally. Place the mouthpiece in your mouth and seal your lips tightly around it. Breathe in slowly and as deeply as possible, raising the piston or the ball toward the top of the column. Hold your breath for 3-5 seconds or for as long as possible. Allow the piston or ball to fall to the bottom of the column. Remove the mouthpiece from your mouth and breathe out normally. Rest for a few seconds and repeat Steps 1 through 7 at least 10 times every 1-2 hours when you are awake. Take your time and take a few normal breaths between deep breaths. The spirometer may include an indicator to show your best effort. Use the indicator as a goal to work toward during each repetition. After each set of 10 deep breaths, practice coughing to be sure your lungs are clear. If you have an incision (the cut made at the time of surgery), support your incision when coughing by placing a pillow or rolled up towels firmly against it. Once you are able to get out of bed, walk around indoors and cough well. You may stop using the incentive spirometer when instructed by your caregiver.  RISKS AND COMPLICATIONS Take your time so you do not get dizzy or light-headed. If you are in pain, you may need to take or ask for pain medication before doing incentive spirometry. It is harder to take a deep breath if you are having pain. AFTER USE Rest and  breathe slowly and easily. It can be helpful to keep track of a log of your progress. Your caregiver can provide you with a simple table to help with this. If you are using the spirometer at home, follow these instructions:  SEEK MEDICAL CARE IF:  You are having difficultly using the spirometer. You have trouble using the spirometer as often as instructed. Your pain medication is not giving enough relief while using the spirometer. You develop fever of 100.5 F (38.1 C) or higher.                                                                                                    SEEK IMMEDIATE MEDICAL CARE IF:  You cough up bloody sputum that had not been present before. You develop fever of 102 F (38.9 C) or greater. You develop worsening pain at or near the incision site. MAKE SURE YOU:  Understand these instructions. Will watch your condition. Will get help right away if you are not doing well or get worse. Document Released: 05/08/2006 Document Revised: 03/20/2011 Document Reviewed: 07/09/2006 [employer] Patient Information 2014 [place], [place].     WHAT IS A BLOOD TRANSFUSION? Blood Transfusion Information  A transfusion is the replacement of blood or some of its parts. Blood is made up of multiple cells which provide different functions. Red blood cells carry oxygen and are used for blood loss replacement. White blood cells fight against infection. Platelets control bleeding. Plasma helps clot blood. Other blood products are available for specialized needs, such as hemophilia or other clotting disorders. BEFORE THE TRANSFUSION  Who gives blood for transfusions?  Healthy volunteers who are fully evaluated to make sure their blood is safe. This is blood bank blood. Transfusion therapy is the safest it has ever been in the practice of medicine. Before blood is taken from a donor, a complete history is taken to make sure that person has no history of diseases nor engages in risky  social behavior (examples are intravenous drug use or sexual activity with multiple partners). The donor's travel history is screened to minimize risk of transmitting infections, such as malaria. The donated blood is tested for signs of infectious diseases, such as HIV and hepatitis. The blood is then tested to be sure it is compatible with you in order to minimize the chance of a transfusion reaction. If you or a relative donates blood, this is often done in anticipation of surgery and is not appropriate for emergency situations. It takes many days to process the donated blood. RISKS AND COMPLICATIONS Although transfusion therapy is very safe and saves many lives, the main dangers of transfusion include:  Getting an infectious disease. Developing a transfusion reaction. This is an allergic reaction to something in the blood you were given. Every precaution is taken to prevent this. The decision to have a blood transfusion has been considered carefully by your caregiver before blood is given. Blood is not given unless the benefits outweigh the risks. AFTER THE TRANSFUSION Right after receiving a blood transfusion, you will usually feel much better and more energetic. This is especially true if your red blood cells have gotten low (anemic). The transfusion raises the level of the red blood cells which carry oxygen, and this usually causes an energy increase. The nurse administering the transfusion will monitor you carefully for complications.  HOME CARE INSTRUCTIONS  No special instructions are needed after a transfusion. You may find your energy is better. Speak with your caregiver about any limitations on activity for underlying diseases you may have. SEEK MEDICAL CARE IF:  Your condition is not improving after your transfusion. You develop redness or irritation at the intravenous (IV) site. SEEK IMMEDIATE MEDICAL CARE IF:  Any of the following symptoms occur over the next 12 hours: Shaking  chills. You have a temperature by mouth above 102 F (38.9 C), not controlled by medicine. Chest, back, or muscle pain. People around you feel you are not acting correctly or are confused. Shortness of breath or difficulty breathing. Dizziness and fainting. You get a rash or develop hives. You have a decrease in urine output. Your urine turns a dark color or changes to pink, red, or brown. Any of the following symptoms occur over the next 10 days: You have a temperature by mouth above 102 F (38.9 C), not controlled by medicine. Shortness of breath. Weakness after normal activity. The white part of the eye turns yellow (jaundice). You have a decrease in the amount of urine or are urinating less often. Your urine turns a dark color or changes to pink, red, or brown. Document Released: 12/24/1999 Document Revised: 03/20/2011 Document Reviewed: 08/12/2007 [facility] Patient Information 2014 [place], [place].  _______________________________________________________________________

## 2022-12-05 NOTE — Progress Notes (Signed)
COVID Vaccine received:  []  No [x]  Yes Date of any COVID positive Test in last 90 days:  PCP - Georgianne Fick, MD  Cardiologist - Olga Millers, MD,  Bernadene Person, NP cardiac clearance in 11-24-22 Epic note.   Chest x-ray - 06-26-2013  2v  Epic EKG -  07-24-2022  Epic Stress Test -  ECHO - 09-30-2019   Epic Cardiac Cath - 04-16-2013  Epic   LHC by Dr. Marchia Meiers Monitor- 09-29-2019  Epic  PCR screen: [x]  Ordered & Completed []   No Order but Needs PROFEND     []   N/A for this surgery  Surgery Plan:  [x]  Ambulatory   []  Outpatient in bed  []  Admit Anesthesia:    []  General  [x]  Spinal  []   Choice []   MAC  Pacemaker / ICD device [x]  No []  Yes   Spinal Cord Stimulator:[x]  No []  Yes       History of Sleep Apnea? []  No [x]  Yes   CPAP used?- []  No [x]  Yes    Does the patient monitor blood sugar?   [x]  N/A   []  No []  Yes  Patient has: [x]  NO Hx DM   []  Pre-DM   []  DM1  []   DM2  Blood Thinner / Instructions: Eliquis,  Hold x 3 days  Aspirin Instructions: none  ERAS Protocol Ordered: []  No  [x]  Yes PRE-SURGERY [x]  ENSURE  []  G2   Patient is to be NPO after: 06:45  Dental hx: []  Dentures:  []  N/A      []  Bridge or Partial:                   []  Loose or Damaged teeth:   Comments: Patient was given the 5 CHG shower / bath instructions for THA surgery along with 2 bottles of the CHG soap. Patient will start this on: Friday 12-15-2022           All questions were asked and answered, Patient voiced understanding of this process.   Activity level: Patient is able / unable to climb a flight of stairs without difficulty; []  No CP  []  No SOB, but would have ___   Patient can / can not perform ADLs without assistance.   Anesthesia review: PAF, OSA-CPAP, CAD- CABG x6 (04-19-2014), HTN, gout  Patient denies shortness of breath, fever, cough and chest pain at PAT appointment.  Patient verbalized understanding and agreement to the Pre-Surgical Instructions that were given to them at this PAT  appointment. Patient was also educated of the need to review these PAT instructions again prior to his surgery.I reviewed the appropriate phone numbers to call if they have any and questions or concerns.

## 2022-12-06 ENCOUNTER — Encounter (HOSPITAL_COMMUNITY)
Admission: RE | Admit: 2022-12-06 | Discharge: 2022-12-06 | Disposition: A | Discharge status: Home / Self Care | Payer: Medicare Other | Source: Ambulatory Visit | Attending: Orthopaedic Surgery | Admitting: Orthopaedic Surgery

## 2022-12-06 ENCOUNTER — Other Ambulatory Visit: Payer: Self-pay

## 2022-12-06 ENCOUNTER — Encounter (HOSPITAL_COMMUNITY): Payer: Self-pay

## 2022-12-06 VITALS — BP 121/86 | HR 80 | Temp 97.9°F | Resp 16 | Ht 74.0 in | Wt 269.0 lb

## 2022-12-06 DIAGNOSIS — Z01818 Encounter for other preprocedural examination: Secondary | ICD-10-CM

## 2022-12-06 DIAGNOSIS — I2510 Atherosclerotic heart disease of native coronary artery without angina pectoris: Secondary | ICD-10-CM | POA: Diagnosis not present

## 2022-12-06 DIAGNOSIS — Z01812 Encounter for preprocedural laboratory examination: Secondary | ICD-10-CM | POA: Diagnosis present

## 2022-12-06 DIAGNOSIS — I10 Essential (primary) hypertension: Secondary | ICD-10-CM | POA: Diagnosis not present

## 2022-12-06 HISTORY — DX: Malignant (primary) neoplasm, unspecified: C80.1

## 2022-12-06 HISTORY — DX: Personal history of urinary calculi: Z87.442

## 2022-12-06 HISTORY — DX: Cardiac arrhythmia, unspecified: I49.9

## 2022-12-06 LAB — CBC
HCT: 47.6 % (ref 39.0–52.0)
Hemoglobin: 15.5 g/dL (ref 13.0–17.0)
MCH: 32.7 pg (ref 26.0–34.0)
MCHC: 32.6 g/dL (ref 30.0–36.0)
MCV: 100.4 fL — ABNORMAL HIGH (ref 80.0–100.0)
Platelets: 195 10*3/uL (ref 150–400)
RBC: 4.74 MIL/uL (ref 4.22–5.81)
RDW: 12.4 % (ref 11.5–15.5)
WBC: 6.7 10*3/uL (ref 4.0–10.5)
nRBC: 0 % (ref 0.0–0.2)

## 2022-12-06 LAB — BASIC METABOLIC PANEL
Anion gap: 10 (ref 5–15)
BUN: 24 mg/dL — ABNORMAL HIGH (ref 8–23)
CO2: 24 mmol/L (ref 22–32)
Calcium: 9.1 mg/dL (ref 8.9–10.3)
Chloride: 104 mmol/L (ref 98–111)
Creatinine, Ser: 1.12 mg/dL (ref 0.61–1.24)
GFR, Estimated: >60 mL/min (ref >60)
Glucose, Bld: 98 mg/dL (ref 70–99)
Potassium: 4.9 mmol/L (ref 3.5–5.1)
Sodium: 138 mmol/L (ref 135–145)

## 2022-12-06 LAB — SURGICAL PCR SCREEN
MRSA, PCR: NEGATIVE
Staphylococcus aureus: NEGATIVE

## 2022-12-06 LAB — TYPE AND SCREEN
ABO/RH(D): O NEG
Antibody Screen: NEGATIVE

## 2022-12-11 ENCOUNTER — Other Ambulatory Visit: Payer: Self-pay | Admitting: Cardiology

## 2022-12-11 DIAGNOSIS — I480 Paroxysmal atrial fibrillation: Secondary | ICD-10-CM

## 2022-12-11 NOTE — Telephone Encounter (Signed)
Prescription refill request for Eliquis received. Indication:afib Last office visit:11/24 Scr:1.12  11/24 Age: 67 Weight:122  kg  Prescription refilled

## 2022-12-12 NOTE — Anesthesia Preprocedure Evaluation (Addendum)
Anesthesia Evaluation  Patient identified by MRN, date of birth, ID band Patient awake    Reviewed: Allergy & Precautions, NPO status , Patient's Chart, lab work & pertinent test results  Airway Mallampati: III  TM Distance: >3 FB Neck ROM: Full    Dental   Pulmonary sleep apnea , former smoker   breath sounds clear to auscultation       Cardiovascular hypertension, Pt. on medications + CAD and + CABG  + dysrhythmias Atrial Fibrillation  Rhythm:Irregular Rate:Normal     Neuro/Psych negative neurological ROS     GI/Hepatic negative GI ROS, Neg liver ROS,,,  Endo/Other  negative endocrine ROS    Renal/GU negative Renal ROS     Musculoskeletal  (+) Arthritis ,    Abdominal   Peds  Hematology  (+) Blood dyscrasia (Last dose eliquis 12/6)   Anesthesia Other Findings   Reproductive/Obstetrics                              Lab Results  Component Value Date   WBC 6.7 12/06/2022   HGB 15.5 12/06/2022   HCT 47.6 12/06/2022   MCV 100.4 (H) 12/06/2022   PLT 195 12/06/2022   Lab Results  Component Value Date   NA 138 12/06/2022   CL 104 12/06/2022   K 4.9 12/06/2022   CO2 24 12/06/2022   BUN 24 (H) 12/06/2022   CREATININE 1.12 12/06/2022   GFRNONAA >60 12/06/2022   CALCIUM 9.1 12/06/2022   ALBUMIN 4.9 (H) 03/20/2019   GLUCOSE 98 12/06/2022    Anesthesia Physical Anesthesia Plan  ASA: 3  Anesthesia Plan: Spinal   Post-op Pain Management: Ofirmev IV (intra-op)* and Toradol IV (intra-op)*   Induction:   PONV Risk Score and Plan: 1 and Propofol infusion, Dexamethasone and Ondansetron  Airway Management Planned: Natural Airway and Simple Face Mask  Additional Equipment:   Intra-op Plan:   Post-operative Plan:   Informed Consent: I have reviewed the patients History and Physical, chart, labs and discussed the procedure including the risks, benefits and alternatives for the  proposed anesthesia with the patient or authorized representative who has indicated his/her understanding and acceptance.     Dental advisory given  Plan Discussed with: CRNA  Anesthesia Plan Comments:         Anesthesia Quick Evaluation

## 2022-12-12 NOTE — Progress Notes (Signed)
Anesthesia Chart Review   Case: [id number] Date/Time: 12/19/22 0930   Procedure: RIGHT TOTAL HIP ARTHROPLASTY ANTERIOR APPROACH (Right: Hip)   Anesthesia type: Spinal   Pre-op diagnosis: RIGHT HIP DEGENERATIVE JOINT DISEASE   Location: [facility] RO[facility] 06 / [facility]   Surgeons: Marcene Corning, MD       DISCUSSION:66 y.o. former smoker with h/o sleep apnea with CPAP, CAD s/p CABG, PAF, right hip djd scheduled for above procedure 12/19/2022 with Dr. Marcene Corning.   Per cardiology preoperative evaluation 11/24/2022, "According to the Revised Cardiac Risk Index (RCRI), his Perioperative Risk of Major Cardiac Event is (%): 0.9. His Functional Capacity in METs is: 7.01 according to the Duke Activity Status Index (DASI). Therefore, based on ACC/AHA guidelines, patient would be at acceptable risk for the planned procedure without further cardiovascular testing.    The patient was advised that if he develops new symptoms prior to surgery to contact our office to arrange for a follow-up visit, and he verbalized understanding.   Per office protocol, patient can hold Eliquis for 3 days prior to procedure.  Please resume Eliquis as soon as possible postprocedure, at the discretion of the surgeon."  VS: BP 121/86 Comment: right arm sitting  Pulse 80   Temp 36.6 C (Oral)   Resp 16   Ht 6\' 2"  (1.88 m)   Wt 122 kg   SpO2 100%   BMI 34.54 kg/m   PROVIDERS: Georgianne Fick, MD is PCP   Cardiologist - Olga Millers, MD  LABS: Labs reviewed: Acceptable for surgery. (all labs ordered are listed, but only abnormal results are displayed)  Labs Reviewed  BASIC METABOLIC PANEL - Abnormal; Notable for the following components:      Result Value   BUN 24 (*)    All other components within normal limits  CBC - Abnormal; Notable for the following components:   MCV 100.4 (*)    All other components within normal limits  SURGICAL PCR SCREEN  TYPE AND SCREEN     IMAGES:   EKG:   CV: Echo  09/30/2019 1. Left ventricular ejection fraction, by estimation, is 50 to 55%. The  left ventricle has low normal function. The left ventricle has no regional  wall motion abnormalities. Left ventricular diastolic function could not  be evaluated.   2. Right ventricular systolic function is normal. The right ventricular  size is normal.   3. The mitral valve is normal in structure. No evidence of mitral valve  regurgitation.   4. The aortic valve is normal in structure. Aortic valve regurgitation is  not visualized.   5. Aortic dilatation noted. There is mild to moderate dilatation of the  ascending aorta, measuring 40 mm.   Past Medical History:  Diagnosis Date   Arthritis    CAD (coronary artery disease) 2015   CABGx6   Cancer (HCC)    melanoma on right leg,  Basal cell on right upper chest.   Dysrhythmia    a.fib   Gout    History of kidney stones    Kidney stones    Obesity (BMI 30-39.9)    PAF (paroxysmal atrial fibrillation) (HCC)    on Eliquis   S/P CABG x 6    Sleep apnea    uses CPAP nightly    Past Surgical History:  Procedure Laterality Date   CORONARY ARTERY BYPASS GRAFT N/A 04/18/2013   Procedure: CORONARY ARTERY BYPASS GRAFTING (CABG) TIMES SIX USING LEFT INTERNAL MAMMARY ARTERY AND RIGHT SAPHENOUS LEG VEIN  HARVESTED ENDOSCOPICALLY;  Surgeon: Delight Ovens, MD;  Location: [facility] OR;  Service: Open Heart Surgery;  Laterality: N/A;   EYE SURGERY Bilateral    lasik surgery   EYE SURGERY Right    retina laser surgery   EYE SURGERY Left    injections to retina,   stopped getting 2020   INTRAOPERATIVE TRANSESOPHAGEAL ECHOCARDIOGRAM N/A 04/18/2013   Procedure: INTRAOPERATIVE TRANSESOPHAGEAL ECHOCARDIOGRAM;  Surgeon: Delight Ovens, MD;  Location: [facility] OR;  Service: Open Heart Surgery;  Laterality: N/A;   KIDNEY STONE SURGERY     LEFT AND RIGHT HEART CATHETERIZATION WITH CORONARY ANGIOGRAM N/A 04/16/2013   Procedure: LEFT AND RIGHT HEART CATHETERIZATION WITH  CORONARY ANGIOGRAM;  Surgeon: Othella Boyer, MD;  Location: [facility] CATH LAB;  Service: Cardiovascular;  Laterality: N/A;   LESION EXCISION     basal cell removed right upper chest.   LESION EXCISION WITH COMPLEX REPAIR Right 08/17/2021   Procedure: LESION EXCISION OF RIGHT LEG WITH COMPLEX REPAIR AND APPLICATION WITH A CELL;  Surgeon: Etter Sjogren, MD;  Location: [facility];  Service: Plastics;  Laterality: Right;  LOCAL   NEPHROLITHOTOMY Right 05/28/2014   Procedure: NEPHROLITHOTOMY PERCUTANEOUS;  Surgeon: Marcine Matar, MD;  Location: [facility];  Service: Urology;  Laterality: Right;   TONSILLECTOMY      MEDICATIONS:  allopurinol (ZYLOPRIM) 100 MG tablet   atorvastatin (LIPITOR) 80 MG tablet   ELIQUIS 5 MG TABS tablet   mometasone (NASONEX) 50 MCG/ACT nasal spray   Multiple Vitamins-Minerals (CENTRUM SILVER PO)   Polyvinyl Alcohol-Povidone (REFRESH OP)   tamsulosin (FLOMAX) 0.4 MG CAPS capsule   No current facility-administered medications for this encounter.   Jodell Cipro Ward, PA-C [facility]-Surgical Testing [phone]

## 2022-12-18 MED ORDER — SODIUM CHLORIDE 0.9 % IV SOLN
2000.0000 mg | INTRAVENOUS | Status: DC
  Filled 2022-12-18: qty 20

## 2022-12-18 NOTE — H&P (Signed)
TOTAL HIP ADMISSION H&P  Patient is admitted for right total hip arthroplasty.  Subjective:  Chief Complaint: right hip pain  HPI: Gary [name], 67 y.o. male, has a history of pain and functional disability in the right hip(s) due to arthritis and patient has failed non-surgical conservative treatments for greater than 12 weeks to include NSAID's and/or analgesics, corticosteriod injections, flexibility and strengthening excercises, use of assistive devices, weight reduction as appropriate, and activity modification.  Onset of symptoms was gradual starting 5 years ago with gradually worsening course since that time.The patient noted no past surgery on the right hip(s).  Patient currently rates pain in the right hip at 10 out of 10 with activity. Patient has night pain, worsening of pain with activity and weight bearing, trendelenberg gait, pain that interfers with activities of daily living, and crepitus. Patient has evidence of subchondral cysts, subchondral sclerosis, periarticular osteophytes, and joint subluxation by imaging studies. This condition presents safety issues increasing the risk of falls. There is no current active infection.  Patient Active Problem List   Diagnosis Date Noted   Chronic anticoagulation 06/10/2019   Sleep apnea 06/10/2019   Hydronephrosis with renal and ureteral calculus obstruction 05/29/2014   Renal stones 05/28/2014   Dyslipidemia, goal LDL below 70    PAF (paroxysmal atrial fibrillation) (HCC) 04/24/2013   Essential hypertension    Gout    Kidney stones    Obesity (BMI 30-39.9) 04/15/2013   CAD (coronary artery disease), native coronary artery    Past Medical History:  Diagnosis Date   Arthritis    CAD (coronary artery disease) 2015   CABGx6   Cancer (HCC)    melanoma on right leg,  Basal cell on right upper chest.   Dysrhythmia    a.fib   Gout    History of kidney stones    Kidney stones    Obesity (BMI 30-39.9)    PAF (paroxysmal atrial  fibrillation) (HCC)    on Eliquis   S/P CABG x 6    Sleep apnea    uses CPAP nightly    Past Surgical History:  Procedure Laterality Date   CORONARY ARTERY BYPASS GRAFT N/A 04/18/2013   Procedure: CORONARY ARTERY BYPASS GRAFTING (CABG) TIMES SIX USING LEFT INTERNAL MAMMARY ARTERY AND RIGHT SAPHENOUS LEG VEIN HARVESTED ENDOSCOPICALLY;  Surgeon: Delight Ovens, MD;  Location: [facility] OR;  Service: Open Heart Surgery;  Laterality: N/A;   EYE SURGERY Bilateral    lasik surgery   EYE SURGERY Right    retina laser surgery   EYE SURGERY Left    injections to retina,   stopped getting 2020   INTRAOPERATIVE TRANSESOPHAGEAL ECHOCARDIOGRAM N/A 04/18/2013   Procedure: INTRAOPERATIVE TRANSESOPHAGEAL ECHOCARDIOGRAM;  Surgeon: Delight Ovens, MD;  Location: [facility] OR;  Service: Open Heart Surgery;  Laterality: N/A;   KIDNEY STONE SURGERY     LEFT AND RIGHT HEART CATHETERIZATION WITH CORONARY ANGIOGRAM N/A 04/16/2013   Procedure: LEFT AND RIGHT HEART CATHETERIZATION WITH CORONARY ANGIOGRAM;  Surgeon: Othella Boyer, MD;  Location: [facility] CATH LAB;  Service: Cardiovascular;  Laterality: N/A;   LESION EXCISION     basal cell removed right upper chest.   LESION EXCISION WITH COMPLEX REPAIR Right 08/17/2021   Procedure: LESION EXCISION OF RIGHT LEG WITH COMPLEX REPAIR AND APPLICATION WITH A CELL;  Surgeon: Etter Sjogren, MD;  Location: [facility];  Service: Plastics;  Laterality: Right;  LOCAL   NEPHROLITHOTOMY Right 05/28/2014   Procedure: NEPHROLITHOTOMY PERCUTANEOUS;  Surgeon: Marcine Matar,  MD;  Location: [facility];  Service: Urology;  Laterality: Right;   TONSILLECTOMY      Current Facility-Administered Medications  Medication Dose Route Frequency Provider Last Rate Last Admin   [START ON 12/19/2022] tranexamic acid (CYKLOKAPRON) 2,000 mg in sodium chloride 0.9 % 50 mL Topical Application  2,000 mg Topical To OR Poindexter, Leann T, RPH       Current Outpatient Medications  Medication  Sig Dispense Refill Last Dose   allopurinol (ZYLOPRIM) 100 MG tablet Take 100 mg by mouth daily.      atorvastatin (LIPITOR) 80 MG tablet TAKE 1 TABLET BY MOUTH EVERY DAY AT 6PM 90 tablet 3    Multiple Vitamins-Minerals (CENTRUM SILVER PO) Take 1 tablet by mouth every morning.       Polyvinyl Alcohol-Povidone (REFRESH OP) Place 1 drop into both eyes 2 (two) times daily as needed (dry eyes).      tamsulosin (FLOMAX) 0.4 MG CAPS capsule Take 0.4 mg by mouth every morning.      ELIQUIS 5 MG TABS tablet TAKE 1 TABLET BY MOUTH TWICE A DAY 60 tablet 5    mometasone (NASONEX) 50 MCG/ACT nasal spray Place 2 sprays into the nose daily. (Patient not taking: Reported on 12/01/2022) 17 g 12 Not Taking   Allergies  Allergen Reactions   Contrast Media [Iodinated Contrast Media] Hives, Shortness Of Breath and Nausea And Vomiting    Social History   Tobacco Use   Smoking status: Former    Current packs/day: 0.00    Types: Cigarettes    Start date: 05/12/1969    Quit date: 05/13/1994    Years since quitting: 28.6   Smokeless tobacco: Never  Substance Use Topics   Alcohol use: Yes    Comment: occasional    Family History  Problem Relation Age of Onset   Heart attack Father    Pancreatic cancer Father    Melanoma Brother      Review of Systems  Musculoskeletal:  Positive for arthralgias.       Right hip  All other systems reviewed and are negative.   Objective:  Physical Exam Constitutional:      Appearance: Normal appearance.  HENT:     Head: Normocephalic and atraumatic.     Nose: Nose normal.     Mouth/Throat:     Pharynx: Oropharynx is clear.  Eyes:     Extraocular Movements: Extraocular movements intact.  Pulmonary:     Effort: Pulmonary effort is normal.  Abdominal:     Palpations: Abdomen is soft.  Musculoskeletal:     Cervical back: Normal range of motion.     Comments: Examination of the right hip shows significantly limited range of motion with pain with any internal or  external rotation.  No real tenderness to palpation throughout.  He is neurovascularly intact distally.   Skin:    General: Skin is warm and dry.  Neurological:     General: No focal deficit present.     Mental Status: He is alert and oriented to person, place, and time. Mental status is at baseline.  Psychiatric:        Mood and Affect: Mood normal.        Behavior: Behavior normal.        Thought Content: Thought content normal.        Judgment: Judgment normal.     Vital signs in last 24 hours:    Labs:   Estimated body mass index is 34.54 kg/m as  calculated from the following:   Height as of 12/06/22: 6\' 2"  (1.88 m).   Weight as of 12/06/22: 122 kg.   Imaging Review Plain radiographs demonstrate severe degenerative joint disease of the right hip(s). The bone quality appears to be good for age and reported activity level.      Assessment/Plan:  End stage primary arthritis, right hip(s)  The patient history, physical examination, clinical judgement of the provider and imaging studies are consistent with end stage degenerative joint disease of the right hip(s) and total hip arthroplasty is deemed medically necessary. The treatment options including medical management, injection therapy, arthroscopy and arthroplasty were discussed at length. The risks and benefits of total hip arthroplasty were presented and reviewed. The risks due to aseptic loosening, infection, stiffness, dislocation/subluxation,  thromboembolic complications and other imponderables were discussed.  The patient acknowledged the explanation, agreed to proceed with the plan and consent was signed. Patient is being admitted for inpatient treatment for surgery, pain control, PT, OT, prophylactic antibiotics, VTE prophylaxis, progressive ambulation and ADL's and discharge planning.The patient is planning to be discharged home with home health services

## 2022-12-19 ENCOUNTER — Ambulatory Visit (HOSPITAL_COMMUNITY): Payer: Medicare Other | Admitting: Anesthesiology

## 2022-12-19 ENCOUNTER — Other Ambulatory Visit: Payer: Self-pay

## 2022-12-19 ENCOUNTER — Encounter (HOSPITAL_COMMUNITY): Payer: Self-pay | Admitting: Orthopaedic Surgery

## 2022-12-19 ENCOUNTER — Encounter (HOSPITAL_COMMUNITY)
Admission: RE | Disposition: A | Discharge status: Home / Self Care | Payer: Self-pay | Source: Ambulatory Visit | Attending: Orthopaedic Surgery

## 2022-12-19 ENCOUNTER — Ambulatory Visit (HOSPITAL_COMMUNITY): Payer: Medicare Other | Admitting: Physician Assistant

## 2022-12-19 ENCOUNTER — Observation Stay (HOSPITAL_COMMUNITY)
Admission: RE | Admit: 2022-12-19 | Discharge: 2022-12-21 | Disposition: A | Discharge status: Home / Self Care | Payer: Medicare Other | Source: Ambulatory Visit | Attending: Orthopaedic Surgery | Admitting: Orthopaedic Surgery

## 2022-12-19 ENCOUNTER — Ambulatory Visit (HOSPITAL_COMMUNITY): Payer: Medicare Other

## 2022-12-19 DIAGNOSIS — M1611 Unilateral primary osteoarthritis, right hip: Secondary | ICD-10-CM

## 2022-12-19 DIAGNOSIS — Z79899 Other long term (current) drug therapy: Secondary | ICD-10-CM | POA: Insufficient documentation

## 2022-12-19 DIAGNOSIS — Z85828 Personal history of other malignant neoplasm of skin: Secondary | ICD-10-CM | POA: Insufficient documentation

## 2022-12-19 DIAGNOSIS — Z87891 Personal history of nicotine dependence: Secondary | ICD-10-CM | POA: Insufficient documentation

## 2022-12-19 DIAGNOSIS — I2510 Atherosclerotic heart disease of native coronary artery without angina pectoris: Secondary | ICD-10-CM | POA: Insufficient documentation

## 2022-12-19 DIAGNOSIS — I480 Paroxysmal atrial fibrillation: Secondary | ICD-10-CM | POA: Diagnosis not present

## 2022-12-19 DIAGNOSIS — Z951 Presence of aortocoronary bypass graft: Secondary | ICD-10-CM | POA: Insufficient documentation

## 2022-12-19 DIAGNOSIS — Z96649 Presence of unspecified artificial hip joint: Secondary | ICD-10-CM

## 2022-12-19 DIAGNOSIS — Z7901 Long term (current) use of anticoagulants: Secondary | ICD-10-CM | POA: Insufficient documentation

## 2022-12-19 HISTORY — PX: TOTAL HIP ARTHROPLASTY: SHX124

## 2022-12-19 LAB — HEMOGLOBIN AND HEMATOCRIT, BLOOD
HCT: 42.7 % (ref 39.0–52.0)
Hemoglobin: 14.7 g/dL (ref 13.0–17.0)

## 2022-12-19 SURGERY — ARTHROPLASTY, HIP, TOTAL, ANTERIOR APPROACH
Anesthesia: Spinal | Site: Hip | Laterality: Right

## 2022-12-19 MED ORDER — TRANEXAMIC ACID 1000 MG/10ML IV SOLN
INTRAVENOUS | Status: DC | PRN
  Administered 2022-12-19: 2000 mg via TOPICAL

## 2022-12-19 MED ORDER — LACTATED RINGERS IV BOLUS
250.0000 mL | Freq: Once | INTRAVENOUS | Status: AC
  Administered 2022-12-19: 250 mL via INTRAVENOUS

## 2022-12-19 MED ORDER — CEFAZOLIN SODIUM-DEXTROSE 2-4 GM/100ML-% IV SOLN
INTRAVENOUS | Status: AC
  Filled 2022-12-19: qty 100

## 2022-12-19 MED ORDER — LACTATED RINGERS IV SOLN: INTRAVENOUS | Status: DC

## 2022-12-19 MED ORDER — CHLORHEXIDINE GLUCONATE 0.12 % MT SOLN
15.0000 mL | Freq: Once | OROMUCOSAL | Status: AC
  Administered 2022-12-19: 15 mL via OROMUCOSAL

## 2022-12-19 MED ORDER — BUPIVACAINE-EPINEPHRINE 0.25% -1:200000 IJ SOLN
INTRAMUSCULAR | Status: AC
  Filled 2022-12-19: qty 1

## 2022-12-19 MED ORDER — MENTHOL 3 MG MT LOZG: 1.0000 | LOZENGE | OROMUCOSAL | Status: DC | PRN

## 2022-12-19 MED ORDER — PHENYLEPHRINE HCL-NACL 20-0.9 MG/250ML-% IV SOLN
INTRAVENOUS | Status: DC | PRN
  Administered 2022-12-19: 40 ug/min via INTRAVENOUS

## 2022-12-19 MED ORDER — LIDOCAINE HCL (PF) 2 % IJ SOLN
INTRAMUSCULAR | Status: AC
  Filled 2022-12-19: qty 5

## 2022-12-19 MED ORDER — METHOCARBAMOL 500 MG PO TABS
ORAL_TABLET | ORAL | Status: AC
  Administered 2022-12-19: 500 mg via ORAL
  Filled 2022-12-19: qty 1

## 2022-12-19 MED ORDER — DIPHENHYDRAMINE HCL 12.5 MG/5ML PO ELIX: 12.5000 mg | ORAL_SOLUTION | ORAL | Status: DC | PRN

## 2022-12-19 MED ORDER — APIXABAN 5 MG PO TABS
5.0000 mg | ORAL_TABLET | Freq: Two times a day (BID) | ORAL | Status: DC
Start: 2022-12-20 — End: 2022-12-21
  Administered 2022-12-20 – 2022-12-21 (×3): 5 mg via ORAL
  Filled 2022-12-19 (×3): qty 1

## 2022-12-19 MED ORDER — METHOCARBAMOL 500 MG PO TABS
500.0000 mg | ORAL_TABLET | Freq: Four times a day (QID) | ORAL | Status: DC | PRN
  Administered 2022-12-19 – 2022-12-21 (×2): 500 mg via ORAL
  Filled 2022-12-19 (×2): qty 1

## 2022-12-19 MED ORDER — ACETAMINOPHEN 325 MG PO TABS
325.0000 mg | ORAL_TABLET | Freq: Four times a day (QID) | ORAL | Status: DC | PRN
  Administered 2022-12-21: 650 mg via ORAL
  Filled 2022-12-19: qty 2

## 2022-12-19 MED ORDER — METOCLOPRAMIDE HCL 5 MG PO TABS: 5.0000 mg | ORAL_TABLET | Freq: Three times a day (TID) | ORAL | Status: DC | PRN

## 2022-12-19 MED ORDER — BUPIVACAINE LIPOSOME 1.3 % IJ SUSP
INTRAMUSCULAR | Status: AC
  Filled 2022-12-19: qty 10

## 2022-12-19 MED ORDER — BUPIVACAINE-EPINEPHRINE (PF) 0.25% -1:200000 IJ SOLN
INTRAMUSCULAR | Status: DC | PRN
  Administered 2022-12-19: 30 mL via PERINEURAL

## 2022-12-19 MED ORDER — 0.9 % SODIUM CHLORIDE (POUR BTL) OPTIME
TOPICAL | Status: DC | PRN
  Administered 2022-12-19: 1000 mL

## 2022-12-19 MED ORDER — HYDROCODONE-ACETAMINOPHEN 7.5-325 MG PO TABS
ORAL_TABLET | ORAL | Status: AC
  Administered 2022-12-19: 2 via ORAL
  Filled 2022-12-19: qty 1

## 2022-12-19 MED ORDER — METOCLOPRAMIDE HCL 5 MG/ML IJ SOLN: 5.0000 mg | Freq: Three times a day (TID) | INTRAMUSCULAR | Status: DC | PRN

## 2022-12-19 MED ORDER — KETOROLAC TROMETHAMINE 15 MG/ML IJ SOLN
15.0000 mg | Freq: Four times a day (QID) | INTRAMUSCULAR | Status: AC
  Filled 2022-12-19: qty 1

## 2022-12-19 MED ORDER — CEFAZOLIN IN SODIUM CHLORIDE 3-0.9 GM/100ML-% IV SOLN
3.0000 g | INTRAVENOUS | Status: AC
  Administered 2022-12-19: 3 g via INTRAVENOUS
  Filled 2022-12-19: qty 100

## 2022-12-19 MED ORDER — MIDAZOLAM HCL 2 MG/2ML IJ SOLN
INTRAMUSCULAR | Status: AC
  Filled 2022-12-19: qty 2

## 2022-12-19 MED ORDER — ACETAMINOPHEN 500 MG PO TABS
ORAL_TABLET | ORAL | Status: AC
  Filled 2022-12-19: qty 1

## 2022-12-19 MED ORDER — PHENYLEPHRINE HCL (PRESSORS) 10 MG/ML IV SOLN
INTRAVENOUS | Status: DC | PRN
  Administered 2022-12-19: 100 ug via INTRAVENOUS

## 2022-12-19 MED ORDER — CEFAZOLIN SODIUM-DEXTROSE 2-4 GM/100ML-% IV SOLN
2.0000 g | Freq: Four times a day (QID) | INTRAVENOUS | Status: AC
  Administered 2022-12-19 (×2): 2 g via INTRAVENOUS
  Filled 2022-12-19: qty 100

## 2022-12-19 MED ORDER — LACTATED RINGERS IV BOLUS
500.0000 mL | Freq: Once | INTRAVENOUS | Status: AC
  Administered 2022-12-19: 500 mL via INTRAVENOUS

## 2022-12-19 MED ORDER — MIDAZOLAM HCL 5 MG/5ML IJ SOLN
INTRAMUSCULAR | Status: DC | PRN
  Administered 2022-12-19 (×2): 1 mg via INTRAVENOUS

## 2022-12-19 MED ORDER — ALUM & MAG HYDROXIDE-SIMETH 200-200-20 MG/5ML PO SUSP: 30.0000 mL | ORAL | Status: DC | PRN

## 2022-12-19 MED ORDER — ONDANSETRON HCL 4 MG/2ML IJ SOLN
INTRAMUSCULAR | Status: DC | PRN
  Administered 2022-12-19: 4 mg via INTRAVENOUS

## 2022-12-19 MED ORDER — PROPOFOL 500 MG/50ML IV EMUL
INTRAVENOUS | Status: DC | PRN
  Administered 2022-12-19: 10 mg via INTRAVENOUS
  Administered 2022-12-19: 50 ug/kg/min via INTRAVENOUS

## 2022-12-19 MED ORDER — ONDANSETRON HCL 4 MG PO TABS: 4.0000 mg | ORAL_TABLET | Freq: Four times a day (QID) | ORAL | Status: DC | PRN

## 2022-12-19 MED ORDER — POVIDONE-IODINE 10 % EX SWAB: 2.0000 | Freq: Once | CUTANEOUS | Status: DC

## 2022-12-19 MED ORDER — HYDROCODONE-ACETAMINOPHEN 7.5-325 MG PO TABS
ORAL_TABLET | ORAL | Status: AC
  Filled 2022-12-19: qty 2

## 2022-12-19 MED ORDER — METHOCARBAMOL 1000 MG/10ML IJ SOLN: 500.0000 mg | Freq: Four times a day (QID) | INTRAMUSCULAR | Status: DC | PRN

## 2022-12-19 MED ORDER — ORAL CARE MOUTH RINSE: 15.0000 mL | Freq: Once | OROMUCOSAL | Status: AC

## 2022-12-19 MED ORDER — TRANEXAMIC ACID-NACL 1000-0.7 MG/100ML-% IV SOLN: 1000.0000 mg | Freq: Once | INTRAVENOUS | Status: AC

## 2022-12-19 MED ORDER — TRANEXAMIC ACID-NACL 1000-0.7 MG/100ML-% IV SOLN
INTRAVENOUS | Status: AC
  Administered 2022-12-19: 1000 mg via INTRAVENOUS
  Filled 2022-12-19: qty 100

## 2022-12-19 MED ORDER — DOCUSATE SODIUM 100 MG PO CAPS
100.0000 mg | ORAL_CAPSULE | Freq: Two times a day (BID) | ORAL | Status: DC
  Administered 2022-12-19 – 2022-12-21 (×4): 100 mg via ORAL
  Filled 2022-12-19 (×4): qty 1

## 2022-12-19 MED ORDER — ALBUMIN HUMAN 5 % IV SOLN: INTRAVENOUS | Status: DC | PRN

## 2022-12-19 MED ORDER — FENTANYL CITRATE (PF) 100 MCG/2ML IJ SOLN
INTRAMUSCULAR | Status: AC
  Filled 2022-12-19: qty 2

## 2022-12-19 MED ORDER — CEFAZOLIN SODIUM-DEXTROSE 2-4 GM/100ML-% IV SOLN: 2.0000 g | Freq: Four times a day (QID) | INTRAVENOUS | Status: DC

## 2022-12-19 MED ORDER — ONDANSETRON HCL 4 MG/2ML IJ SOLN: 4.0000 mg | Freq: Four times a day (QID) | INTRAMUSCULAR | Status: DC | PRN

## 2022-12-19 MED ORDER — TRANEXAMIC ACID-NACL 1000-0.7 MG/100ML-% IV SOLN
1000.0000 mg | INTRAVENOUS | Status: AC
  Administered 2022-12-19: 1000 mg via INTRAVENOUS
  Filled 2022-12-19: qty 100

## 2022-12-19 MED ORDER — STERILE WATER FOR INJECTION IJ SOLN
INTRAMUSCULAR | Status: DC | PRN
  Administered 2022-12-19: 1000 mL

## 2022-12-19 MED ORDER — BUPIVACAINE IN DEXTROSE 0.75-8.25 % IT SOLN
INTRATHECAL | Status: DC | PRN
  Administered 2022-12-19: 1.8 mL via INTRATHECAL

## 2022-12-19 MED ORDER — PHENYLEPHRINE HCL-NACL 20-0.9 MG/250ML-% IV SOLN
INTRAVENOUS | Status: AC
  Filled 2022-12-19: qty 250

## 2022-12-19 MED ORDER — HYDROCODONE-ACETAMINOPHEN 7.5-325 MG PO TABS
1.0000 | ORAL_TABLET | ORAL | Status: DC | PRN
  Administered 2022-12-19: 1 via ORAL
  Administered 2022-12-19 – 2022-12-20 (×2): 2 via ORAL
  Filled 2022-12-19 (×2): qty 2

## 2022-12-19 MED ORDER — BUPIVACAINE LIPOSOME 1.3 % IJ SUSP: 10.0000 mL | Freq: Once | INTRAMUSCULAR | Status: DC

## 2022-12-19 MED ORDER — DEXAMETHASONE SODIUM PHOSPHATE 10 MG/ML IJ SOLN
INTRAMUSCULAR | Status: AC
  Filled 2022-12-19: qty 1

## 2022-12-19 MED ORDER — HYDROCODONE-ACETAMINOPHEN 5-325 MG PO TABS: 1.0000 | ORAL_TABLET | Freq: Four times a day (QID) | ORAL | 0 refills | Status: DC | PRN

## 2022-12-19 MED ORDER — ALBUMIN HUMAN 5 % IV SOLN
INTRAVENOUS | Status: AC
  Filled 2022-12-19: qty 250

## 2022-12-19 MED ORDER — HYDROCODONE-ACETAMINOPHEN 5-325 MG PO TABS
1.0000 | ORAL_TABLET | ORAL | Status: DC | PRN
  Administered 2022-12-20: 2 via ORAL
  Administered 2022-12-20: 1 via ORAL
  Administered 2022-12-20 – 2022-12-21 (×3): 2 via ORAL
  Filled 2022-12-19: qty 1
  Filled 2022-12-19 (×2): qty 2
  Filled 2022-12-19: qty 1
  Filled 2022-12-19 (×2): qty 2

## 2022-12-19 MED ORDER — FENTANYL CITRATE PF 50 MCG/ML IJ SOSY: 25.0000 ug | PREFILLED_SYRINGE | INTRAMUSCULAR | Status: DC | PRN

## 2022-12-19 MED ORDER — BUPIVACAINE LIPOSOME 1.3 % IJ SUSP
INTRAMUSCULAR | Status: DC | PRN
  Administered 2022-12-19: 10 mL

## 2022-12-19 MED ORDER — TAMSULOSIN HCL 0.4 MG PO CAPS
0.4000 mg | ORAL_CAPSULE | Freq: Every morning | ORAL | Status: DC
  Administered 2022-12-20 – 2022-12-21 (×2): 0.4 mg via ORAL
  Filled 2022-12-19 (×2): qty 1

## 2022-12-19 MED ORDER — ATORVASTATIN CALCIUM 40 MG PO TABS
80.0000 mg | ORAL_TABLET | Freq: Every day | ORAL | Status: DC
  Administered 2022-12-20: 80 mg via ORAL
  Filled 2022-12-19 (×2): qty 2

## 2022-12-19 MED ORDER — MORPHINE SULFATE (PF) 2 MG/ML IV SOLN: 0.5000 mg | INTRAVENOUS | Status: DC | PRN

## 2022-12-19 MED ORDER — PROPOFOL 1000 MG/100ML IV EMUL
INTRAVENOUS | Status: AC
  Filled 2022-12-19: qty 100

## 2022-12-19 MED ORDER — ALLOPURINOL 100 MG PO TABS
100.0000 mg | ORAL_TABLET | Freq: Every day | ORAL | Status: DC
  Administered 2022-12-20 – 2022-12-21 (×2): 100 mg via ORAL
  Filled 2022-12-19 (×2): qty 1

## 2022-12-19 MED ORDER — FENTANYL CITRATE (PF) 100 MCG/2ML IJ SOLN
INTRAMUSCULAR | Status: DC | PRN
  Administered 2022-12-19: 50 ug via INTRAVENOUS
  Administered 2022-12-19 (×2): 25 ug via INTRAVENOUS

## 2022-12-19 MED ORDER — ONDANSETRON HCL 4 MG/2ML IJ SOLN
INTRAMUSCULAR | Status: AC
  Filled 2022-12-19: qty 2

## 2022-12-19 MED ORDER — LACTATED RINGERS IV SOLN: INTRAVENOUS | Status: AC

## 2022-12-19 MED ORDER — ACETAMINOPHEN 325 MG PO TABS
ORAL_TABLET | ORAL | Status: AC
  Filled 2022-12-19: qty 2

## 2022-12-19 MED ORDER — DEXAMETHASONE SODIUM PHOSPHATE 10 MG/ML IJ SOLN
INTRAMUSCULAR | Status: DC | PRN
  Administered 2022-12-19: 4 mg via INTRAVENOUS

## 2022-12-19 MED ORDER — AMISULPRIDE (ANTIEMETIC) 5 MG/2ML IV SOLN: 10.0000 mg | Freq: Once | INTRAVENOUS | Status: DC | PRN

## 2022-12-19 MED ORDER — TIZANIDINE HCL 4 MG PO TABS: 4.0000 mg | ORAL_TABLET | Freq: Four times a day (QID) | ORAL | 1 refills | Status: DC | PRN

## 2022-12-19 MED ORDER — PHENOL 1.4 % MT LIQD: 1.0000 | OROMUCOSAL | Status: DC | PRN

## 2022-12-19 MED ORDER — ACETAMINOPHEN 500 MG PO TABS
500.0000 mg | ORAL_TABLET | Freq: Four times a day (QID) | ORAL | Status: AC
  Administered 2022-12-19 – 2022-12-20 (×4): 500 mg via ORAL
  Filled 2022-12-19 (×3): qty 1

## 2022-12-19 MED ORDER — BISACODYL 5 MG PO TBEC
5.0000 mg | DELAYED_RELEASE_TABLET | Freq: Every day | ORAL | Status: DC | PRN
  Administered 2022-12-21: 5 mg via ORAL
  Filled 2022-12-19: qty 1

## 2022-12-19 SURGICAL SUPPLY — 48 items
BAG COUNTER SPONGE SURGICOUNT (BAG) IMPLANT
BAG DECANTER FOR FLEXI CONT (MISCELLANEOUS) ×2 IMPLANT
BLADE SAW SGTL 18X1.27X75 (BLADE) ×2 IMPLANT
BOOTIES KNEE HIGH SLOAN (MISCELLANEOUS) ×2 IMPLANT
CELLS DAT CNTRL 66122 CELL SVR (MISCELLANEOUS) ×1 IMPLANT
COVER PERINEAL POST (MISCELLANEOUS) ×2 IMPLANT
COVER SURGICAL LIGHT HANDLE (MISCELLANEOUS) ×2 IMPLANT
CUP PINN GRIPTON 54 100 (Cup) IMPLANT
DRAPE FOOT SWITCH (DRAPES) ×2 IMPLANT
DRAPE IMP U-DRAPE 54X76 (DRAPES) ×2 IMPLANT
DRAPE STERI IOBAN 125X83 (DRAPES) ×2 IMPLANT
DRAPE U-SHAPE 47X51 STRL (DRAPES) ×2 IMPLANT
DRSG AQUACEL AG ADV 3.5X 6 (GAUZE/BANDAGES/DRESSINGS) ×2 IMPLANT
DRSG AQUACEL AG ADV 3.5X10 (GAUZE/BANDAGES/DRESSINGS) IMPLANT
DURAPREP 26ML APPLICATOR (WOUND CARE) ×2 IMPLANT
ELECT BLADE TIP CTD 4 INCH (ELECTRODE) ×2 IMPLANT
ELECT REM PT RETURN 15FT ADLT (MISCELLANEOUS) ×2 IMPLANT
ELIMINATOR HOLE APEX DEPUY (Hips) IMPLANT
GLOVE BIO SURGEON STRL SZ8 (GLOVE) ×4 IMPLANT
GLOVE BIOGEL PI IND STRL 7.0 (GLOVE) ×2 IMPLANT
GLOVE BIOGEL PI IND STRL 8 (GLOVE) ×4 IMPLANT
GLOVE SURG SYN 7.0 (GLOVE) ×1 IMPLANT
GLOVE SURG SYN 7.0 PF PI (GLOVE) ×2 IMPLANT
GOWN SRG XL LVL 4 BRTHBL STRL (GOWNS) ×2 IMPLANT
GOWN STRL REUS W/ TWL XL LVL3 (GOWN DISPOSABLE) ×4 IMPLANT
HEAD CERAMIC DELTA 36 PLUS 1.5 (Hips) IMPLANT
HOLDER FOLEY CATH W/STRAP (MISCELLANEOUS) ×2 IMPLANT
KIT TURNOVER KIT A (KITS) IMPLANT
LINER NEUTRAL 54X36MM PLUS 4 (Hips) IMPLANT
MANIFOLD NEPTUNE II (INSTRUMENTS) ×2 IMPLANT
NDL HYPO 22X1.5 SAFETY MO (MISCELLANEOUS) ×2 IMPLANT
NEEDLE HYPO 22X1.5 SAFETY MO (MISCELLANEOUS) ×1 IMPLANT
NS IRRIG 1000ML POUR BTL (IV SOLUTION) ×2 IMPLANT
PACK ANTERIOR HIP CUSTOM (KITS) ×2 IMPLANT
PROTECTOR NERVE ULNAR (MISCELLANEOUS) ×2 IMPLANT
RETRACTOR WND ALEXIS 18 MED (MISCELLANEOUS) ×2 IMPLANT
RTRCTR WOUND ALEXIS 18CM MED (MISCELLANEOUS) ×1
SPIKE FLUID TRANSFER (MISCELLANEOUS) ×2 IMPLANT
STEM FEMORAL SZ 6MM STD ACTIS (Stem) IMPLANT
SUT ETHIBOND NAB CT1 #1 30IN (SUTURE) ×4 IMPLANT
SUT STRATAFIX 0 PDS 27 VIOLET (SUTURE) ×1
SUT VIC AB 1 CT1 36 (SUTURE) ×2 IMPLANT
SUT VIC AB 2-0 CT1 TAPERPNT 27 (SUTURE) ×2 IMPLANT
SUT VICRYL AB 3-0 FS1 BRD 27IN (SUTURE) ×2 IMPLANT
SUTURE STRATFX 0 PDS 27 VIOLET (SUTURE) ×2 IMPLANT
SYR 50ML LL SCALE MARK (SYRINGE) ×2 IMPLANT
TRAY FOLEY MTR SLVR 16FR STAT (SET/KITS/TRAYS/PACK) ×2 IMPLANT
YANKAUER SUCT BULB TIP NO VENT (SUCTIONS) ×2 IMPLANT

## 2022-12-19 NOTE — Op Note (Signed)
PRE-OP DIAGNOSIS:  RIGHT HIP DEGENERATIVE JOINT DISEASE POST-OP DIAGNOSIS:  same PROCEDURE: RIGHT TOTAL HIP ARTHROPLASTY ANTERIOR APPROACH ANESTHESIA:  Spinal and MAC SURGEON:  Marcene Corning MD ASSISTANT:  Elodia Florence PA-C   INDICATIONS FOR PROCEDURE:  The patient is a 67 y.o. male with a long history of a painful hip.  This has persisted despite multiple conservative measures.  The patient has persisted with pain and dysfunction making rest and activity difficult.  A total hip replacement is offered as surgical treatment.  Informed operative consent was obtained after discussion of possible complications including reaction to anesthesia, infection, neurovascular injury, dislocation, DVT, PE, and death.  The importance of the postoperative rehab program to optimize result was stressed with the patient.  SUMMARY OF FINDINGS AND PROCEDURE:  Under the above anesthesia through a anterior approach an the Hana table a right THR was performed.  The patient had severe degenerative change and excellent bone quality.  We used DePuy components to replace the hip and these were size 6 Actis femur capped with a +1.5 36mm ceramic hip ball.  On the acetabular side we used a size 54 Gription shell with a  plus 4 neutral polyethylene liner.  We did use a hole eliminator.  Elodia Florence PA-C assisted throughout and was invaluable to the completion of the case in that he helped position and retract while I performed the procedure.  He also closed simultaneously to help minimize OR time.  I used fluoroscopy throughout the case to check position of implants and leg lengths and read all of these views myself.  DESCRIPTION OF PROCEDURE:  The patient was taken to the OR suite where the above anesthetic was applied.  The patient was then positioned on the Hana table supine.  All bony prominences were appropriately padded.  Prep and drape was then performed in normal sterile fashion.  The patient was given kefzol preoperative  antibiotic and an appropriate time out was performed.  We then took an anterior approach to the right hip.  Dissection was taken through adipose to the tensor fascia lata fascia.  This structure was incised longitudinally and we dissected in the intermuscular interval just medial to this muscle.  Cobra retractors were placed superior and inferior to the femoral neck superficial to the capsule.  A capsular incision was then made and the retractors were placed along the femoral neck.  Xray was brought in to get a good level for the femoral neck cut which was made with an oscillating saw and osteotome.  The femoral head was removed with a corkscrew.  The acetabulum was exposed and some labral tissues were excised. Reaming was taken to the inside wall of the pelvis and sequentially up to 1 mm smaller than the actual component.  A trial of components was done and then the aforementioned acetabular shell was placed in appropriate tilt and anteversion confirmed by fluoroscopy. The liner was placed along with the hole eliminator and attention was turned to the femur.  The leg was brought down and over into adduction and the elevator bar was used to raise the femur up gently in the wound.  The piriformis was released with care taken to preserve the obturator internus attachment and all of the posterior capsule. The femur was reamed and then broached to the appropriate size.  A trial reduction was done and the aforementioned head and neck assembly gave [place] the best stability in extension with external rotation.  Leg lengths were felt to be about equal  by fluoroscopic exam.  The trial components were removed and the wound irrigated.  We then placed the femoral component in appropriate anteversion.  The head was applied to a dry stem neck and the hip again reduced.  It was again stable in the aforementioned position.  The would was irrigated again followed by re-approximation of anterior capsule with ethibond suture. Tensor  fascia was repaired with V-loc suture  followed by deep closure with #O and #2 undyed vicryl.  Skin was closed with subQ stitch and steristrips followed by a sterile dressing.  EBL and IOF can be obtained from anesthesia records.  DISPOSITION:  The patient was taken to PACU in stable condition to potentially go home same day depending on ability to walk and tolerate liquids.

## 2022-12-19 NOTE — Transfer of Care (Signed)
Immediate Anesthesia Transfer of Care Note  Patient: Gary [name]  Procedure(s) Performed: RIGHT TOTAL HIP ARTHROPLASTY ANTERIOR APPROACH (Right: Hip)  Patient Location: PACU  Anesthesia Type:Spinal  Level of Consciousness: awake, alert , oriented, and patient cooperative  Airway & Oxygen Therapy: Patient Spontanous Breathing and Patient connected to face mask oxygen  Post-op Assessment: Report given to RN and Post -op Vital signs reviewed and stable  Post vital signs: Reviewed and stable  Last Vitals:  Vitals Value Taken Time  BP    Temp    Pulse    Resp    SpO2      Last Pain:  Vitals:   12/19/22 0618  TempSrc:   PainSc: 0-No pain      Patients Stated Pain Goal: 5 (12/19/22 0618)  Complications: No notable events documented.

## 2022-12-19 NOTE — Interval H&P Note (Signed)
History and Physical Interval Note:  12/19/2022 7:31 AM  Gary [name]  has presented today for surgery, with the diagnosis of RIGHT HIP DEGENERATIVE JOINT DISEASE.  The various methods of treatment have been discussed with the patient and family. After consideration of risks, benefits and other options for treatment, the patient has consented to  Procedure(s): RIGHT TOTAL HIP ARTHROPLASTY ANTERIOR APPROACH (Right) as a surgical intervention.  The patient's history has been reviewed, patient examined, no change in status, stable for surgery.  I have reviewed the patient's chart and labs.  Questions were answered to the patient's satisfaction.     Velna Ochs

## 2022-12-19 NOTE — Anesthesia Postprocedure Evaluation (Signed)
Anesthesia Post Note  Patient: Gary [name]  Procedure(s) Performed: RIGHT TOTAL HIP ARTHROPLASTY ANTERIOR APPROACH (Right: Hip)     Patient location during evaluation: PACU Anesthesia Type: Spinal Level of consciousness: awake and alert Pain management: pain level controlled Vital Signs Assessment: post-procedure vital signs reviewed and stable Respiratory status: spontaneous breathing, nonlabored ventilation, respiratory function stable and patient connected to nasal cannula oxygen Cardiovascular status: blood pressure returned to baseline and stable Postop Assessment: no apparent nausea or vomiting and spinal receding Anesthetic complications: no  No notable events documented.  Last Vitals:  Vitals:   12/19/22 1137 12/19/22 1200  BP: 139/82 121/80  Pulse: 74 73  Resp:    Temp: (!) 36.4 C   SpO2: 98% 100%    Last Pain:  Vitals:   12/19/22 1130  TempSrc:   PainSc: 0-No pain                 Kennieth Rad

## 2022-12-19 NOTE — Progress Notes (Signed)
PT Note  Patient Details Name: Gary [name] MRN: [id number] DOB: 1955/04/03   Cancelled Treatment:    Reason Eval/Treat Not Completed: Other (comment). 1559 pt is electing to be admitted to the hospital, no longer appropriate for same day d/c  reporting still felling a little light headed. PT to continue to follow acutely.   Johnny Bridge, PT Acute Rehab   Gary [name] 12/19/2022, 4:19 PM

## 2022-12-19 NOTE — Plan of Care (Signed)
  Problem: Education: Goal: Knowledge of General Education information will improve Description: Including pain rating scale, medication(s)/side effects and non-pharmacologic comfort measures Outcome: Progressing   Problem: Health Behavior/Discharge Planning: Goal: Ability to manage health-related needs will improve Outcome: Adequate for Discharge   Problem: Clinical Measurements: Goal: Ability to maintain clinical measurements within normal limits will improve Outcome: Progressing Goal: Will remain free from infection Outcome: Progressing Goal: Diagnostic test results will improve Outcome: Progressing Goal: Respiratory complications will improve Outcome: Progressing Goal: Cardiovascular complication will be avoided Outcome: Progressing   Problem: Activity: Goal: Risk for activity intolerance will decrease Outcome: Progressing   Problem: Nutrition: Goal: Adequate nutrition will be maintained Outcome: Completed/Met   Problem: Coping: Goal: Level of anxiety will decrease Outcome: Progressing   Problem: Elimination: Goal: Will not experience complications related to bowel motility Outcome: Progressing Goal: Will not experience complications related to urinary retention Outcome: Completed/Met   Problem: Pain Management: Goal: General experience of comfort will improve Outcome: Progressing   Problem: Safety: Goal: Ability to remain free from injury will improve Outcome: Progressing   Problem: Skin Integrity: Goal: Risk for impaired skin integrity will decrease Outcome: Progressing   Problem: Education: Goal: Knowledge of the prescribed therapeutic regimen will improve Outcome: Progressing Goal: Understanding of discharge needs will improve Outcome: Progressing Goal: Individualized Educational Video(s) Outcome: Completed/Met   Problem: Activity: Goal: Ability to avoid complications of mobility impairment will improve Outcome: Progressing Goal: Ability to  tolerate increased activity will improve Outcome: Progressing   Problem: Clinical Measurements: Goal: Postoperative complications will be avoided or minimized Outcome: Progressing   Problem: Pain Management: Goal: Pain level will decrease with appropriate interventions Outcome: Progressing   Problem: Skin Integrity: Goal: Will show signs of wound healing Outcome: Progressing

## 2022-12-19 NOTE — Anesthesia Procedure Notes (Signed)
Spinal  Patient location during procedure: OR Start time: 12/19/2022 7:44 AM End time: 12/19/2022 7:49 AM Reason for block: surgical anesthesia Staffing Performed: anesthesiologist  Anesthesiologist: Marcene Duos, MD Performed by: Marcene Duos, MD Authorized by: Marcene Duos, MD   Preanesthetic Checklist Completed: patient identified, IV checked, site marked, risks and benefits discussed, surgical consent, monitors and equipment checked, pre-op evaluation and timeout performed Spinal Block Patient position: sitting Prep: DuraPrep Patient monitoring: heart rate, cardiac monitor, continuous pulse ox and blood pressure Approach: midline Location: L4-5 Injection technique: single-shot Needle Needle type: Pencan  Needle gauge: 24 G Needle length: 9 cm Assessment Sensory level: T4 Events: CSF return

## 2022-12-19 NOTE — Progress Notes (Signed)
   12/19/22 2300  BiPAP/CPAP/SIPAP  $ Non-Invasive Home Ventilator  Initial  BiPAP/CPAP/SIPAP Pt Type Adult  BiPAP/CPAP/SIPAP DREAMSTATIOND  Mask Type Nasal pillows (Pts. own)  Respiratory Rate 18 breaths/min  EPAP 4 cmH2O  FiO2 (%) 21 %  Flow Rate 0 lpm  Patient Home Equipment Yes (Only hose/Nasal Pillows)  Auto Titrate No  CPAP/SIPAP surface wiped down Yes   98% R/A, P-83, Pt. made aware to notify if needed.

## 2022-12-19 NOTE — Evaluation (Signed)
Physical Therapy Evaluation Patient Details Name: Gary [name] MRN: [id number] DOB: 09-06-1955 Today's Date: 12/19/2022  History of Present Illness  67 yo male presents to therapy s/p R THA, anterior approach on 12/19/2022 due to failure of conservative measures. Pt PMH includes but is not limited to: OSA on CPAP, hydronephrosis, renal stones, PAF, HTN, gout, and CAD s/p CABG x 6.  Clinical Impression      Gary [name] is a 67 y.o. male POD 0 s/p R THA. Patient reports IND with mobility at baseline. Patient is now limited by functional impairments (see PT problem list below) and requires CGA for supine to sit and S for sit to supine for bed mobility and CGA for transfers. Patient was  unable to safely ambulate due to reports of light headedness with standing TE and pre-gait tasks.  PT initiated instruction in exercise to facilitate ROM and circulation to manage edema. Patient will benefit from continued skilled PT interventions to address impairments and progress towards PLOF. Pt to return later in the day to determine if pt is safe and appropriate for same day d/c. Acute PT will follow to progress mobility and stair training in preparation for safe discharge home with family support and OPPT services scheduled for 12/20. Bp semi reclined in bed prior PT intervention 127/98 Bp s/p light headed event and pt returned to semi reclined in bed 121/91    If plan is discharge home, recommend the following: A little help with walking and/or transfers;A little help with bathing/dressing/bathroom;Assistance with cooking/housework;Assist for transportation;Help with stairs or ramp for entrance   Can travel by private vehicle        Equipment Recommendations Rolling walker (2 wheels)  Recommendations for Other Services       Functional Status Assessment Patient has had a recent decline in their functional status and demonstrates the ability to make significant improvements in function in a reasonable  and predictable amount of time.     Precautions / Restrictions Precautions Precautions: Fall Restrictions Weight Bearing Restrictions: No      Mobility  Bed Mobility Overal bed mobility: Needs Assistance Bed Mobility: Supine to Sit, Sit to Supine     Supine to sit: Contact guard, HOB elevated Sit to supine: Supervision, HOB elevated   General bed mobility comments: min cues and pt returned to bed due to reports of light headedness elevated bed to emulate home setting    Transfers Overall transfer level: Needs assistance Equipment used: Rolling walker (2 wheels) Transfers: Sit to/from Stand Sit to Stand: Contact guard assist           General transfer comment: min cues, pt able to progress to pre gait tasks in standing with weight shifting and single limb stance as well as hip abd during the course of standing pt reported feeling light headed and initiated return to bed. pt and Bp monitored    Ambulation/Gait               General Gait Details: NT due to pt report of light headedness  Stairs            Wheelchair Mobility     Tilt Bed    Modified Rankin (Stroke Patients Only)       Balance Overall balance assessment: Needs assistance Sitting-balance support: Feet supported Sitting balance-Leahy Scale: Good     Standing balance support: Bilateral upper extremity supported, During functional activity, Reliant on assistive device for balance Standing balance-Leahy Scale: Poor  Pertinent Vitals/Pain Pain Assessment Pain Assessment: 0-10 Pain Score: 3  Pain Location: R hip and LE Pain Descriptors / Indicators: Aching, Constant, Discomfort, Throbbing, Operative site guarding Pain Intervention(s): Limited activity within patient's tolerance, Monitored during session, Premedicated before session, Repositioned, Ice applied    Home Living Family/patient expects to be discharged to:: Private  residence Living Arrangements: Spouse/significant other Available Help at Discharge: Family Type of Home: House Home Access: Stairs to enter Entrance Stairs-Rails: None Entrance Stairs-Number of Steps: 2 Alternate Level Stairs-Number of Steps: 14 (7 +7 with landing) Home Layout: Multi-level;Bed/bath upstairs Home Equipment: Cane - single point;Shower seat - built in Additional Comments: pt and spouse report bed is high in home setting    Prior Function Prior Level of Function : Independent/Modified Independent;Driving;Working/employed             Mobility Comments: IND no AD for all ADLs, self care tasks and IADLs,       Extremity/Trunk Assessment        Lower Extremity Assessment Lower Extremity Assessment: RLE deficits/detail RLE Deficits / Details: ankle DF/PF RLE Sensation: WNL    Cervical / Trunk Assessment Cervical / Trunk Assessment: Normal  Communication   Communication Communication: No apparent difficulties  Cognition Arousal: Alert Behavior During Therapy: WFL for tasks assessed/performed Overall Cognitive Status: Within Functional Limits for tasks assessed                                          General Comments      Exercises Total Joint Exercises Ankle Circles/Pumps: AROM, Both, 10 reps Quad Sets: AROM, Right, 5 reps Heel Slides: AROM, Right, 5 reps Hip ABduction/ADduction: AROM, Right, 5 reps, Standing   Assessment/Plan    PT Assessment Patient needs continued PT services  PT Problem List Decreased strength;Decreased range of motion;Decreased activity tolerance;Decreased balance;Decreased mobility;Decreased coordination;Pain       PT Treatment Interventions DME instruction;Gait training;Stair training;Functional mobility training;Therapeutic activities;Therapeutic exercise;Balance training;Neuromuscular re-education;Patient/family education;Modalities    PT Goals (Current goals can be found in the Care Plan section)   Acute Rehab PT Goals Patient Stated Goal: to get back to normal and golf PT Goal Formulation: With patient Time For Goal Achievement: 01/02/23 Potential to Achieve Goals: Good    Frequency 7X/week     Co-evaluation               AM-PAC PT "6 Clicks" Mobility  Outcome Measure Help needed turning from your back to your side while in a flat bed without using bedrails?: A Little Help needed moving from lying on your back to sitting on the side of a flat bed without using bedrails?: A Little Help needed moving to and from a bed to a chair (including a wheelchair)?: A Little Help needed standing up from a chair using your arms (e.g., wheelchair or bedside chair)?: A Little Help needed to walk in hospital room?: Total Help needed climbing 3-5 steps with a railing? : Total 6 Click Score: 14    End of Session Equipment Utilized During Treatment: Gait belt Activity Tolerance: Treatment limited secondary to medical complications (Comment) (reports of symptomatic orthostatic hypotension) Patient left: in bed;with call bell/phone within reach;with family/visitor present Nurse Communication: Mobility status;Other (comment) (pt physiological response) PT Visit Diagnosis: Unsteadiness on feet (R26.81);Other abnormalities of gait and mobility (R26.89);Muscle weakness (generalized) (M62.81);Difficulty in walking, not elsewhere classified (R26.2);Pain Pain - Right/Left: Right Pain -  part of body: Hip;Leg    Time: 4540-9811 PT Time Calculation (min) (ACUTE ONLY): 30 min   Charges:   PT Evaluation $PT Eval Low Complexity: 1 Low PT Treatments $Therapeutic Activity: 8-22 mins PT General Charges $$ ACUTE PT VISIT: 1 Visit         Johnny Bridge, PT Acute Rehab   Jacqualyn Posey 12/19/2022, 2:56 PM

## 2022-12-20 ENCOUNTER — Encounter (HOSPITAL_COMMUNITY): Payer: Self-pay | Admitting: Orthopaedic Surgery

## 2022-12-20 DIAGNOSIS — M1611 Unilateral primary osteoarthritis, right hip: Secondary | ICD-10-CM | POA: Diagnosis not present

## 2022-12-20 LAB — CBC
HCT: 39.5 % (ref 39.0–52.0)
Hemoglobin: 13.4 g/dL (ref 13.0–17.0)
MCH: 33.7 pg (ref 26.0–34.0)
MCHC: 33.9 g/dL (ref 30.0–36.0)
MCV: 99.2 fL (ref 80.0–100.0)
Platelets: 178 10*3/uL (ref 150–400)
RBC: 3.98 MIL/uL — ABNORMAL LOW (ref 4.22–5.81)
RDW: 12.6 % (ref 11.5–15.5)
WBC: 13.4 10*3/uL — ABNORMAL HIGH (ref 4.0–10.5)
nRBC: 0 % (ref 0.0–0.2)

## 2022-12-20 NOTE — Plan of Care (Signed)
  Problem: Safety: Goal: Ability to remain free from injury will improve Outcome: Progressing   Problem: Education: Goal: Knowledge of the prescribed therapeutic regimen will improve Outcome: Progressing   Problem: Clinical Measurements: Goal: Postoperative complications will be avoided or minimized Outcome: Progressing   Problem: Pain Management: Goal: Pain level will decrease with appropriate interventions Outcome: Progressing

## 2022-12-20 NOTE — Discharge Summary (Addendum)
Patient ID: Gary [name] MRN: [id number] DOB/AGE: 02/19/1955 67 y.o.  Admit date: 12/19/2022 Discharge date: 12/21/2022  Admission Diagnoses:  Principal Problem:   Primary localized osteoarthritis of right hip Active Problems:   S/P total hip arthroplasty   Discharge Diagnoses:  Same  Past Medical History:  Diagnosis Date   Arthritis    CAD (coronary artery disease) 2015   CABGx6   Cancer (HCC)    melanoma on right leg,  Basal cell on right upper chest.   Dysrhythmia    a.fib   Gout    History of kidney stones    Kidney stones    Obesity (BMI 30-39.9)    PAF (paroxysmal atrial fibrillation) (HCC)    on Eliquis   S/P CABG x 6    Sleep apnea    uses CPAP nightly    Surgeries: Procedure(s): RIGHT TOTAL HIP ARTHROPLASTY ANTERIOR APPROACH on 12/19/2022   Consultants:   Discharged Condition: Improved  Hospital Course: Kelvion Sotiropoulos is an 67 y.o. male who was admitted 12/19/2022 for operative treatment ofPrimary localized osteoarthritis of right hip. Patient has severe unremitting pain that affects sleep, daily activities, and work/hobbies. After pre-op clearance the patient was taken to the operating room on 12/19/2022 and underwent  Procedure(s): RIGHT TOTAL HIP ARTHROPLASTY ANTERIOR APPROACH.    Patient was given perioperative antibiotics:  Anti-infectives (From admission, onward)    Start     Dose/Rate Route Frequency Ordered Stop   12/19/22 1423  ceFAZolin (ANCEF) 2-4 GM/100ML-% IVPB       Note to Pharmacy: Ileene Hutchinson L: cabinet override      12/19/22 1423 12/20/22 0229   12/19/22 1400  ceFAZolin (ANCEF) IVPB 2g/100 mL premix        2 g 200 mL/hr over 30 Minutes Intravenous Every 6 hours 12/19/22 0942 12/19/22 2200   12/19/22 1330  ceFAZolin (ANCEF) IVPB 2g/100 mL premix  Status:  Discontinued        2 g 200 mL/hr over 30 Minutes Intravenous Every 6 hours 12/19/22 0942 12/19/22 0942   12/19/22 0600  ceFAZolin (ANCEF) IVPB 3g/100 mL premix        3  g 200 mL/hr over 30 Minutes Intravenous On call to O.R. 12/19/22 0532 12/19/22 0805        Patient was given sequential compression devices, early ambulation, and chemoprophylaxis to prevent DVT.  Patient benefited maximally from hospital stay and there were no complications.    Recent vital signs: Patient Vitals for the past 24 hrs:  BP Temp Pulse Resp SpO2  12/20/22 0652 114/79 98 F (36.7 C) 83 14 100 %  12/20/22 0147 110/67 98.4 F (36.9 C) 78 14 97 %  12/19/22 2155 106/73 98.5 F (36.9 C) 86 19 95 %  12/19/22 1832 (!) 128/95 98.5 F (36.9 C) 80 14 98 %  12/19/22 1700 (!) 130/92 -- 82 -- 100 %  12/19/22 1600 128/85 -- 76 -- 100 %  12/19/22 1430 -- -- -- -- 99 %  12/19/22 1400 (!) 139/91 -- 65 -- 100 %  12/19/22 1300 (!) 127/98 -- 75 -- 100 %  12/19/22 1200 121/80 -- 73 -- 100 %  12/19/22 1137 139/82 (!) 97.5 F (36.4 C) 74 -- 98 %  12/19/22 1130 127/84 97.7 F (36.5 C) (!) 45 10 100 %  12/19/22 1115 122/86 -- 84 13 100 %  12/19/22 1100 (!) 114/94 -- 65 14 100 %  12/19/22 1045 133/79 -- 63 12 100 %  12/19/22 1030 130/86 Marland Kitchen)  97.5 F (36.4 C) (!) 50 15 100 %  12/19/22 1015 114/81 -- 69 14 100 %  12/19/22 1000 109/79 -- 72 10 100 %  12/19/22 0945 95/65 -- 76 10 94 %  12/19/22 0936 102/68 (!) 97.5 F (36.4 C) 77 12 100 %     Recent laboratory studies:  Recent Labs    12/19/22 1617 12/20/22 0337  WBC  --  13.4*  HGB 14.7 13.4  HCT 42.7 39.5  PLT  --  178     Discharge Medications:   Allergies as of 12/20/2022       Reactions   Contrast Media [iodinated Contrast Media] Hives, Shortness Of Breath, Nausea And Vomiting        Medication List     STOP taking these medications    mometasone 50 MCG/ACT nasal spray Commonly known as: NASONEX       TAKE these medications    allopurinol 100 MG tablet Commonly known as: ZYLOPRIM Take 100 mg by mouth daily.   atorvastatin 80 MG tablet Commonly known as: LIPITOR TAKE 1 TABLET BY MOUTH EVERY DAY AT  6PM   CENTRUM SILVER PO Take 1 tablet by mouth every morning.   Eliquis 5 MG Tabs tablet Generic drug: apixaban TAKE 1 TABLET BY MOUTH TWICE A DAY   HYDROcodone-acetaminophen 5-325 MG tablet Commonly known as: NORCO/VICODIN Take 1-2 tablets by mouth every 6 (six) hours as needed for moderate pain (pain score 4-6).   REFRESH OP Place 1 drop into both eyes 2 (two) times daily as needed (dry eyes).   tamsulosin 0.4 MG Caps capsule Commonly known as: FLOMAX Take 0.4 mg by mouth every morning.   tiZANidine 4 MG tablet Commonly known as: Zanaflex Take 1 tablet (4 mg total) by mouth every 6 (six) hours as needed for muscle spasms.   vitamin C 1000 MG tablet Take 1,000 mg by mouth daily.               Durable Medical Equipment  (From admission, onward)           Start     Ordered   12/19/22 1800  DME Walker rolling  Once       Question:  Patient needs a walker to treat with the following condition  Answer:  Primary osteoarthritis of right hip   12/19/22 1759   12/19/22 1800  DME 3 n 1  Once        12/19/22 1759   12/19/22 1800  DME Bedside commode  Once       Question:  Patient needs a bedside commode to treat with the following condition  Answer:  Primary osteoarthritis of right hip   12/19/22 1759            Diagnostic Studies: DG HIP UNILAT WITH PELVIS 1V RIGHT  Result Date: 12/19/2022 CLINICAL DATA:  Right hip surgery EXAM: DG HIP (WITH OR WITHOUT PELVIS) 1V RIGHT COMPARISON:  None Available. FINDINGS: Series of fluoroscopic intraoperative images document changes of right hip arthroplasty, components projecting in expected location. IMPRESSION: Right hip arthroplasty, components projecting in expected location. Electronically Signed   By: Corlis Leak M.D.   On: 12/19/2022 13:07   DG C-Arm 1-60 Min-No Report  Result Date: 12/19/2022 Fluoroscopy was utilized by the requesting physician.  No radiographic interpretation.   DG C-Arm 1-60 Min-No  Report  Result Date: 12/19/2022 Fluoroscopy was utilized by the requesting physician.  No radiographic interpretation.    Disposition: Discharge disposition: 01-Home  or Self Care       Discharge Instructions     Call MD / Call 911   Complete by: As directed    If you experience chest pain or shortness of breath, CALL 911 and be transported to the hospital emergency room.  If you develope a fever above 101 F, pus (white drainage) or increased drainage or redness at the wound, or calf pain, call your surgeon's office.   Call MD / Call 911   Complete by: As directed    If you experience chest pain or shortness of breath, CALL 911 and be transported to the hospital emergency room.  If you develope a fever above 101 F, pus (white drainage) or increased drainage or redness at the wound, or calf pain, call your surgeon's office.   Constipation Prevention   Complete by: As directed    Drink plenty of fluids.  Prune juice may be helpful.  You may use a stool softener, such as Colace (over the counter) 100 mg twice a day.  Use MiraLax (over the counter) for constipation as needed.   Constipation Prevention   Complete by: As directed    Drink plenty of fluids.  Prune juice may be helpful.  You may use a stool softener, such as Colace (over the counter) 100 mg twice a day.  Use MiraLax (over the counter) for constipation as needed.   Diet - low sodium heart healthy   Complete by: As directed    Diet - low sodium heart healthy   Complete by: As directed    Discharge instructions   Complete by: As directed    INSTRUCTIONS AFTER JOINT REPLACEMENT   Remove items at home which could result in a fall. This includes throw rugs or furniture in walking pathways ICE to the affected joint every three hours while awake for 30 minutes at a time, for at least the first 3-5 days, and then as needed for pain and swelling.  Continue to use ice for pain and swelling. You may notice swelling that will progress  down to the foot and ankle.  This is normal after surgery.  Elevate your leg when you are not up walking on it.   Continue to use the breathing machine you got in the hospital (incentive spirometer) which will help keep your temperature down.  It is common for your temperature to cycle up and down following surgery, especially at night when you are not up moving around and exerting yourself.  The breathing machine keeps your lungs expanded and your temperature down.   DIET:  As you were doing prior to hospitalization, we recommend a well-balanced diet.  DRESSING / WOUND CARE / SHOWERING  You may shower 3 days after surgery, but keep the wounds dry during showering.  You may use an occlusive plastic wrap (Press'n Seal for example), NO SOAKING/SUBMERGING IN THE BATHTUB.  If the bandage gets wet, change with a clean dry gauze.  If the incision gets wet, pat the wound dry with a clean towel.  ACTIVITY  Increase activity slowly as tolerated, but follow the weight bearing instructions below.   No driving for 6 weeks or until further direction given by your physician.  You cannot drive while taking narcotics.  No lifting or carrying greater than 10 lbs. until further directed by your surgeon. Avoid periods of inactivity such as sitting longer than an hour when not asleep. This helps prevent blood clots.  You may return to work once you  are authorized by your doctor.     WEIGHT BEARING   Weight bearing as tolerated with assist device (walker, cane, etc) as directed, use it as long as suggested by your surgeon or therapist, typically at least 4-6 weeks.   EXERCISES  Results after joint replacement surgery are often greatly improved when you follow the exercise, range of motion and muscle strengthening exercises prescribed by your doctor. Safety measures are also important to protect the joint from further injury. Any time any of these exercises cause you to have increased pain or swelling, decrease  what you are doing until you are comfortable again and then slowly increase them. If you have problems or questions, call your caregiver or physical therapist for advice.   Rehabilitation is important following a joint replacement. After just a few days of immobilization, the muscles of the leg can become weakened and shrink (atrophy).  These exercises are designed to build up the tone and strength of the thigh and leg muscles and to improve motion. Often times heat used for twenty to thirty minutes before working out will loosen up your tissues and help with improving the range of motion but do not use heat for the first two weeks following surgery (sometimes heat can increase post-operative swelling).   These exercises can be done on a training (exercise) mat, on the floor, on a table or on a bed. Use whatever works the best and is most comfortable for you.    Use music or television while you are exercising so that the exercises are a pleasant break in your day. This will make your life better with the exercises acting as a break in your routine that you can look forward to.   Perform all exercises about fifteen times, three times per day or as directed.  You should exercise both the operative leg and the other leg as well.  Exercises include:   Quad Sets - Tighten up the muscle on the front of the thigh (Quad) and hold for 5-10 seconds.   Straight Leg Raises - With your knee straight (if you were given a brace, keep it on), lift the leg to 60 degrees, hold for 3 seconds, and slowly lower the leg.  Perform this exercise against resistance later as your leg gets stronger.  Leg Slides: Lying on your back, slowly slide your foot toward your buttocks, bending your knee up off the floor (only go as far as is comfortable). Then slowly slide your foot back down until your leg is flat on the floor again.  Angel Wings: Lying on your back spread your legs to the side as far apart as you can without causing  discomfort.  Hamstring Strength:  Lying on your back, push your heel against the floor with your leg straight by tightening up the muscles of your buttocks.  Repeat, but this time bend your knee to a comfortable angle, and push your heel against the floor.  You may put a pillow under the heel to make it more comfortable if necessary.   A rehabilitation program following joint replacement surgery can speed recovery and prevent re-injury in the future due to weakened muscles. Contact your doctor or a physical therapist for more information on knee rehabilitation.    CONSTIPATION  Constipation is defined medically as fewer than three stools per week and severe constipation as less than one stool per week.  Even if you have a regular bowel pattern at home, your normal regimen is likely to  be disrupted due to multiple reasons following surgery.  Combination of anesthesia, postoperative narcotics, change in appetite and fluid intake all can affect your bowels.   YOU MUST use at least one of the following options; they are listed in order of increasing strength to get the job done.  They are all available over the counter, and you may need to use some, POSSIBLY even all of these options:    Drink plenty of fluids (prune juice may be helpful) and high fiber foods Colace 100 mg by mouth twice a day  Senokot for constipation as directed and as needed Dulcolax (bisacodyl), take with full glass of water  Miralax (polyethylene glycol) once or twice a day as needed.  If you have tried all these things and are unable to have a bowel movement in the first 3-4 days after surgery call either your surgeon or your primary doctor.    If you experience loose stools or diarrhea, hold the medications until you stool forms back up.  If your symptoms do not get better within 1 week or if they get worse, check with your doctor.  If you experience "the worst abdominal pain ever" or develop nausea or vomiting, please contact  the office immediately for further recommendations for treatment.   ITCHING:  If you experience itching with your medications, try taking only a single pain pill, or even half a pain pill at a time.  You can also use Benadryl over the counter for itching or also to help with sleep.   TED HOSE STOCKINGS:  Use stockings on both legs until for at least 2 weeks or as directed by physician office. They may be removed at night for sleeping.  MEDICATIONS:  See your medication summary on the "After Visit Summary" that nursing will review with you.  You may have some home medications which will be placed on hold until you complete the course of blood thinner medication.  It is important for you to complete the blood thinner medication as prescribed.  PRECAUTIONS:  If you experience chest pain or shortness of breath - call 911 immediately for transfer to the hospital emergency department.   If you develop a fever greater that 101 F, purulent drainage from wound, increased redness or drainage from wound, foul odor from the wound/dressing, or calf pain - CONTACT YOUR SURGEON.                                                   FOLLOW-UP APPOINTMENTS:  If you do not already have a post-op appointment, please call the office for an appointment to be seen by your surgeon.  Guidelines for how soon to be seen are listed in your "After Visit Summary", but are typically between 1-4 weeks after surgery.  OTHER INSTRUCTIONS:   Knee Replacement:  Do not place pillow under knee, focus on keeping the knee straight while resting. CPM instructions: 0-90 degrees, 2 hours in the morning, 2 hours in the afternoon, and 2 hours in the evening. Place foam block, curve side up under heel at all times except when in CPM or when walking.  DO NOT modify, tear, cut, or change the foam block in any way.  POST-OPERATIVE OPIOID TAPER INSTRUCTIONS: It is important to wean off of your opioid medication as soon as possible. If you do not  need  pain medication after your surgery it is ok to stop day one. Opioids include: Codeine, Hydrocodone(Norco, Vicodin), Oxycodone(Percocet, oxycontin) and hydromorphone amongst others.  Long term and even short term use of opiods can cause: Increased pain response Dependence Constipation Depression Respiratory depression And more.  Withdrawal symptoms can include Flu like symptoms Nausea, vomiting And more Techniques to manage these symptoms Hydrate well Eat regular healthy meals Stay active Use relaxation techniques(deep breathing, meditating, yoga) Do Not substitute Alcohol to help with tapering If you have been on opioids for less than two weeks and do not have pain than it is ok to stop all together.  Plan to wean off of opioids This plan should start within one week post op of your joint replacement. Maintain the same interval or time between taking each dose and first decrease the dose.  Cut the total daily intake of opioids by one tablet each day Next start to increase the time between doses. The last dose that should be eliminated is the evening dose.     MAKE SURE YOU:  Understand these instructions.  Get help right away if you are not doing well or get worse.    Thank you for letting us be a part of your medical care team.  It is a privilege we respect greatly.  We hope these instructions will help you stay on track for a fast and full recovery!   Discharge instructions   Complete by: As directed    INSTRUCTIONS AFTER JOINT REPLACEMENT   Remove items at home which could result in a fall. This includes throw rugs or furniture in walking pathways ICE to the affected joint every three hours while awake for 30 minutes at a time, for at least the first 3-5 days, and then as needed for pain and swelling.  Continue to use ice for pain and swelling. You may notice swelling that will progress down to the foot and ankle.  This is normal after surgery.  Elevate your leg when  you are not up walking on it.   Continue to use the breathing machine you got in the hospital (incentive spirometer) which will help keep your temperature down.  It is common for your temperature to cycle up and down following surgery, especially at night when you are not up moving around and exerting yourself.  The breathing machine keeps your lungs expanded and your temperature down.   DIET:  As you were doing prior to hospitalization, we recommend a well-balanced diet.  DRESSING / WOUND CARE / SHOWERING  You may shower 3 days after surgery, but keep the wounds dry during showering.  You may use an occlusive plastic wrap (Press'n Seal for example), NO SOAKING/SUBMERGING IN THE BATHTUB.  If the bandage gets wet, change with a clean dry gauze.  If the incision gets wet, pat the wound dry with a clean towel.  ACTIVITY  Increase activity slowly as tolerated, but follow the weight bearing instructions below.   No driving for 6 weeks or until further direction given by your physician.  You cannot drive while taking narcotics.  No lifting or carrying greater than 10 lbs. until further directed by your surgeon. Avoid periods of inactivity such as sitting longer than an hour when not asleep. This helps prevent blood clots.  You may return to work once you are authorized by your doctor.     WEIGHT BEARING   Weight bearing as tolerated with assist device (walker, cane, etc) as directed, use it as  long as suggested by your surgeon or therapist, typically at least 4-6 weeks.   EXERCISES  Results after joint replacement surgery are often greatly improved when you follow the exercise, range of motion and muscle strengthening exercises prescribed by your doctor. Safety measures are also important to protect the joint from further injury. Any time any of these exercises cause you to have increased pain or swelling, decrease what you are doing until you are comfortable again and then slowly increase them.  If you have problems or questions, call your caregiver or physical therapist for advice.   Rehabilitation is important following a joint replacement. After just a few days of immobilization, the muscles of the leg can become weakened and shrink (atrophy).  These exercises are designed to build up the tone and strength of the thigh and leg muscles and to improve motion. Often times heat used for twenty to thirty minutes before working out will loosen up your tissues and help with improving the range of motion but do not use heat for the first two weeks following surgery (sometimes heat can increase post-operative swelling).   These exercises can be done on a training (exercise) mat, on the floor, on a table or on a bed. Use whatever works the best and is most comfortable for you.    Use music or television while you are exercising so that the exercises are a pleasant break in your day. This will make your life better with the exercises acting as a break in your routine that you can look forward to.   Perform all exercises about fifteen times, three times per day or as directed.  You should exercise both the operative leg and the other leg as well.  Exercises include:   Quad Sets - Tighten up the muscle on the front of the thigh (Quad) and hold for 5-10 seconds.   Straight Leg Raises - With your knee straight (if you were given a brace, keep it on), lift the leg to 60 degrees, hold for 3 seconds, and slowly lower the leg.  Perform this exercise against resistance later as your leg gets stronger.  Leg Slides: Lying on your back, slowly slide your foot toward your buttocks, bending your knee up off the floor (only go as far as is comfortable). Then slowly slide your foot back down until your leg is flat on the floor again.  Angel Wings: Lying on your back spread your legs to the side as far apart as you can without causing discomfort.  Hamstring Strength:  Lying on your back, push your heel against the floor  with your leg straight by tightening up the muscles of your buttocks.  Repeat, but this time bend your knee to a comfortable angle, and push your heel against the floor.  You may put a pillow under the heel to make it more comfortable if necessary.   A rehabilitation program following joint replacement surgery can speed recovery and prevent re-injury in the future due to weakened muscles. Contact your doctor or a physical therapist for more information on knee rehabilitation.    CONSTIPATION  Constipation is defined medically as fewer than three stools per week and severe constipation as less than one stool per week.  Even if you have a regular bowel pattern at home, your normal regimen is likely to be disrupted due to multiple reasons following surgery.  Combination of anesthesia, postoperative narcotics, change in appetite and fluid intake all can affect your bowels.   YOU  MUST use at least one of the following options; they are listed in order of increasing strength to get the job done.  They are all available over the counter, and you may need to use some, POSSIBLY even all of these options:    Drink plenty of fluids (prune juice may be helpful) and high fiber foods Colace 100 mg by mouth twice a day  Senokot for constipation as directed and as needed Dulcolax (bisacodyl), take with full glass of water  Miralax (polyethylene glycol) once or twice a day as needed.  If you have tried all these things and are unable to have a bowel movement in the first 3-4 days after surgery call either your surgeon or your primary doctor.    If you experience loose stools or diarrhea, hold the medications until you stool forms back up.  If your symptoms do not get better within 1 week or if they get worse, check with your doctor.  If you experience "the worst abdominal pain ever" or develop nausea or vomiting, please contact the office immediately for further recommendations for treatment.   ITCHING:  If you  experience itching with your medications, try taking only a single pain pill, or even half a pain pill at a time.  You can also use Benadryl over the counter for itching or also to help with sleep.   TED HOSE STOCKINGS:  Use stockings on both legs until for at least 2 weeks or as directed by physician office. They may be removed at night for sleeping.  MEDICATIONS:  See your medication summary on the "After Visit Summary" that nursing will review with you.  You may have some home medications which will be placed on hold until you complete the course of blood thinner medication.  It is important for you to complete the blood thinner medication as prescribed.  PRECAUTIONS:  If you experience chest pain or shortness of breath - call 911 immediately for transfer to the hospital emergency department.   If you develop a fever greater that 101 F, purulent drainage from wound, increased redness or drainage from wound, foul odor from the wound/dressing, or calf pain - CONTACT YOUR SURGEON.                                                   FOLLOW-UP APPOINTMENTS:  If you do not already have a post-op appointment, please call the office for an appointment to be seen by your surgeon.  Guidelines for how soon to be seen are listed in your "After Visit Summary", but are typically between 1-4 weeks after surgery.  OTHER INSTRUCTIONS:   Knee Replacement:  Do not place pillow under knee, focus on keeping the knee straight while resting. CPM instructions: 0-90 degrees, 2 hours in the morning, 2 hours in the afternoon, and 2 hours in the evening. Place foam block, curve side up under heel at all times except when in CPM or when walking.  DO NOT modify, tear, cut, or change the foam block in any way.  POST-OPERATIVE OPIOID TAPER INSTRUCTIONS: It is important to wean off of your opioid medication as soon as possible. If you do not need pain medication after your surgery it is ok to stop day one. Opioids  include: Codeine, Hydrocodone(Norco, Vicodin), Oxycodone(Percocet, oxycontin) and hydromorphone amongst others.  Long term and even  short term use of opiods can cause: Increased pain response Dependence Constipation Depression Respiratory depression And more.  Withdrawal symptoms can include Flu like symptoms Nausea, vomiting And more Techniques to manage these symptoms Hydrate well Eat regular healthy meals Stay active Use relaxation techniques(deep breathing, meditating, yoga) Do Not substitute Alcohol to help with tapering If you have been on opioids for less than two weeks and do not have pain than it is ok to stop all together.  Plan to wean off of opioids This plan should start within one week post op of your joint replacement. Maintain the same interval or time between taking each dose and first decrease the dose.  Cut the total daily intake of opioids by one tablet each day Next start to increase the time between doses. The last dose that should be eliminated is the evening dose.     MAKE SURE YOU:  Understand these instructions.  Get help right away if you are not doing well or get worse.    Thank you for letting us be a part of your medical care team.  It is a privilege we respect greatly.  We hope these instructions will help you stay on track for a fast and full recovery!   Increase activity slowly as tolerated   Complete by: As directed    Increase activity slowly as tolerated   Complete by: As directed    Post-operative opioid taper instructions:   Complete by: As directed    POST-OPERATIVE OPIOID TAPER INSTRUCTIONS: It is important to wean off of your opioid medication as soon as possible. If you do not need pain medication after your surgery it is ok to stop day one. Opioids include: Codeine, Hydrocodone(Norco, Vicodin), Oxycodone(Percocet, oxycontin) and hydromorphone amongst others.  Long term and even short term use of opiods can cause: Increased pain  response Dependence Constipation Depression Respiratory depression And more.  Withdrawal symptoms can include Flu like symptoms Nausea, vomiting And more Techniques to manage these symptoms Hydrate well Eat regular healthy meals Stay active Use relaxation techniques(deep breathing, meditating, yoga) Do Not substitute Alcohol to help with tapering If you have been on opioids for less than two weeks and do not have pain than it is ok to stop all together.  Plan to wean off of opioids This plan should start within one week post op of your joint replacement. Maintain the same interval or time between taking each dose and first decrease the dose.  Cut the total daily intake of opioids by one tablet each day Next start to increase the time between doses. The last dose that should be eliminated is the evening dose.      Post-operative opioid taper instructions:   Complete by: As directed    POST-OPERATIVE OPIOID TAPER INSTRUCTIONS: It is important to wean off of your opioid medication as soon as possible. If you do not need pain medication after your surgery it is ok to stop day one. Opioids include: Codeine, Hydrocodone(Norco, Vicodin), Oxycodone(Percocet, oxycontin) and hydromorphone amongst others.  Long term and even short term use of opiods can cause: Increased pain response Dependence Constipation Depression Respiratory depression And more.  Withdrawal symptoms can include Flu like symptoms Nausea, vomiting And more Techniques to manage these symptoms Hydrate well Eat regular healthy meals Stay active Use relaxation techniques(deep breathing, meditating, yoga) Do Not substitute Alcohol to help with tapering If you have been on opioids for less than two weeks and do not have pain than it is ok  to stop all together.  Plan to wean off of opioids This plan should start within one week post op of your joint replacement. Maintain the same interval or time between taking  each dose and first decrease the dose.  Cut the total daily intake of opioids by one tablet each day Next start to increase the time between doses. The last dose that should be eliminated is the evening dose.           Follow-up Information     Marcene Corning, MD. Schedule an appointment as soon as possible for a visit in 2 week(s).   Specialty: Orthopedic Surgery Contact information: [address] [place] [address] [phone]                  Signed: Ginger Organ Allante Whitmire 12/20/2022, 8:26 AM

## 2022-12-20 NOTE — TOC Transition Note (Signed)
Transition of Care [facility]) - CM/SW Discharge Note   Patient Details  Name: Gary [name] MRN: [id number] Date of Birth: 07/16/1955  Transition of Care [facility]) CM/SW Contact:  Amada Jupiter, LCSW Phone Number: 12/20/2022, 1:14 PM   Clinical Narrative:     Met with pt and spouse who confirm pt has needed DME in the home.  OPPT already set up with [facility].  No further TOC needs.  Final next level of care: OP Rehab Barriers to Discharge: No Barriers Identified   Patient Goals and CMS Choice      Discharge Placement                         Discharge Plan and Services Additional resources added to the After Visit Summary for                  DME Arranged: N/A DME Agency: NA                  Social Determinants of Health (SDOH) Interventions SDOH Screenings   Food Insecurity: No Food Insecurity (12/19/2022)  Housing: Low Risk  (12/19/2022)  Transportation Needs: No Transportation Needs (12/19/2022)  Utilities: Not At Risk (12/19/2022)  Tobacco Use: Medium Risk (12/19/2022)     Readmission Risk Interventions     No data to display

## 2022-12-20 NOTE — Progress Notes (Signed)
PT Cancellation Note  Patient Details Name: Tritan Riegsecker MRN: [id number] DOB: 03/20/1955   Cancelled Treatment: Afternoon treatment session cancelled due to incision bleeding, per RN.   Lazaro Arms, SPTA 12/20/2022, 1:43 PM

## 2022-12-20 NOTE — Progress Notes (Signed)
Subjective: 1 Day Post-Op Procedure(s) (LRB): RIGHT TOTAL HIP ARTHROPLASTY ANTERIOR APPROACH (Right)  Patient doing well this morning. He needed a bandage change in PACU due to bleeding through. He is resting comfortably in bed. He did not get up with PT yesterday due to feeling dizzy.  Activity level:  wbat Diet tolerance:  ok Voiding:  ok Patient reports pain as mild.    Objective: Vital signs in last 24 hours: Temp:  [97.5 F (36.4 C)-98.5 F (36.9 C)] 98 F (36.7 C) (12/11 0652) Pulse Rate:  [45-86] 83 (12/11 0652) Resp:  [10-19] 14 (12/11 0652) BP: (95-139)/(65-98) 114/79 (12/11 0652) SpO2:  [94 %-100 %] 100 % (12/11 0652) FiO2 (%):  [21 %] 21 % (12/10 2300)  Labs: Recent Labs    12/19/22 1617 12/20/22 0337  HGB 14.7 13.4   Recent Labs    12/19/22 1617 12/20/22 0337  WBC  --  13.4*  RBC  --  3.98*  HCT 42.7 39.5  PLT  --  178   No results for input(s): "NA", "K", "CL", "CO2", "BUN", "CREATININE", "GLUCOSE", "CALCIUM" in the last 72 hours. No results for input(s): "LABPT", "INR" in the last 72 hours.  Physical Exam:  Neurologically intact ABD soft Neurovascular intact Sensation intact distally Intact pulses distally Dorsiflexion/Plantar flexion intact Incision: scant drainage No cellulitis present Compartment soft  Assessment/Plan:  1 Day Post-Op Procedure(s) (LRB): RIGHT TOTAL HIP ARTHROPLASTY ANTERIOR APPROACH (Right) Advance diet Up with therapy Discharge home with home health today if cleared by PT and doing well. We changed his bandage again this morning.  His blood work looked good so I think the dizziness yesterday was from anesthesia meds. He will resume his home eliquis.  Follow up in office 2 weeks post op.  Gary [name] 12/20/2022, 8:22 AM

## 2022-12-20 NOTE — Progress Notes (Signed)
Physical Therapy Treatment Patient Details Name: Gary [name] MRN: [id number] DOB: 1955/03/13 Today's Date: 12/20/2022   History of Present Illness 67 yo male presents to therapy s/p R THA, anterior approach on 12/19/2022 due to failure of conservative measures. Pt PMH includes but is not limited to: OSA on CPAP, hydronephrosis, renal stones, PAF, HTN, gout, and CAD s/p CABG x 6.    PT Comments  POD # 1 am session limited due to Orthostatic Hypotension associated with dizziness after practicing stairs.   RN called to room BP sitting after stairs 105/75 HR 84 Standing BP 94/61 HR 77 Left pt in recliner recovery BP 119/74 HR 69 Will see pt again later this morning.     If plan is discharge home, recommend the following: A little help with walking and/or transfers;A little help with bathing/dressing/bathroom;Assistance with cooking/housework;Assist for transportation;Help with stairs or ramp for entrance   Can travel by private vehicle        Equipment Recommendations  Rolling walker (2 wheels)    Recommendations for Other Services       Precautions / Restrictions Precautions Precautions: Fall Restrictions Weight Bearing Restrictions: No Other Position/Activity Restrictions: WBAT     Mobility  Bed Mobility   Bed Mobility: Supine to Sit     Supine to sit: Supervision, Contact guard     General bed mobility comments: demonstarted and instructed how to use belt to self asisst LE off bed.    Transfers Overall transfer level: Needs assistance Equipment used: Rolling walker (2 wheels) Transfers: Sit to/from Stand Sit to Stand: Contact guard assist, Supervision           General transfer comment: <25% VC's on proper tech, proper hand placement and safety with walker.    Ambulation/Gait Ambulation/Gait assistance: Supervision, Contact guard assist Gait Distance (Feet): 6 Feet Assistive device: Rolling walker (2 wheels) Gait Pattern/deviations: Step-to pattern,  Decreased stance time - right Gait velocity: decreased     General Gait Details: limited amb distance due to increased c/o dizziness after practicing stairs.  BP dropped to 94/61 HR 77 "I don't feel good".  Quickly asissted to recliner.   Stairs Stairs: Yes Stairs assistance: Min assist Stair Management: No rails, Step to pattern, Forwards, With walker Number of Stairs: 2 General stair comments: with Spouse present "hands on" asisst under Therapist direction on proper tech up/down 2 steps forward using walker.  Pt did well until end when he c/o dizziness.   Wheelchair Mobility     Tilt Bed    Modified Rankin (Stroke Patients Only)       Balance                                            Cognition Arousal: Alert Behavior During Therapy: WFL for tasks assessed/performed Overall Cognitive Status: Within Functional Limits for tasks assessed                                 General Comments: AxO x 3 pleasant/motivated        Exercises      General Comments        Pertinent Vitals/Pain Pain Assessment Pain Assessment: 0-10 Pain Score: 4  Pain Location: R hip Pain Descriptors / Indicators: Aching, Grimacing, Operative site guarding, Sore Pain Intervention(s): Monitored during session, Premedicated before session,  Repositioned, Ice applied    Home Living                          Prior Function            PT Goals (current goals can now be found in the care plan section) Progress towards PT goals: Progressing toward goals    Frequency    7X/week      PT Plan      Co-evaluation              AM-PAC PT "6 Clicks" Mobility   Outcome Measure  Help needed turning from your back to your side while in a flat bed without using bedrails?: A Little Help needed moving from lying on your back to sitting on the side of a flat bed without using bedrails?: A Little Help needed moving to and from a bed to a chair  (including a wheelchair)?: A Little Help needed standing up from a chair using your arms (e.g., wheelchair or bedside chair)?: A Little Help needed to walk in hospital room?: A Little Help needed climbing 3-5 steps with a railing? : A Lot 6 Click Score: 17    End of Session Equipment Utilized During Treatment: Gait belt Activity Tolerance: Treatment limited secondary to medical complications (Comment) (orthostatic) Patient left: in chair;with call bell/phone within reach;with chair alarm set;with family/visitor present Nurse Communication: Mobility status PT Visit Diagnosis: Unsteadiness on feet (R26.81);Other abnormalities of gait and mobility (R26.89);Muscle weakness (generalized) (M62.81);Difficulty in walking, not elsewhere classified (R26.2);Pain Pain - part of body: Hip;Leg     Time: 0902-0930 PT Time Calculation (min) (ACUTE ONLY): 28 min  Charges:    $Gait Training: 8-22 mins $Therapeutic Activity: 8-22 mins PT General Charges $$ ACUTE PT VISIT: 1 Visit                     Felecia Shelling  PTA Acute  Rehabilitation Services Office M-F          [phone]

## 2022-12-21 DIAGNOSIS — M1611 Unilateral primary osteoarthritis, right hip: Secondary | ICD-10-CM | POA: Diagnosis not present

## 2022-12-21 NOTE — Progress Notes (Signed)
Physical Therapy Treatment Patient Details Name: Gary [name] MRN: [id number] DOB: 06-15-1955 Today's Date: 12/21/2022   History of Present Illness 67 yo male presents to therapy s/p R THA, anterior approach on 12/19/2022 due to failure of conservative measures. Pt PMH includes but is not limited to: OSA on CPAP, hydronephrosis, renal stones, PAF, HTN, gout, and CAD s/p CABG x 6.    PT Comments  POD # 3 pm session Pt feeling "better".  General Gait Details: tolerated an increased distance with no c/o dizziness "feeling better" BP 119/75 HR 88 Then returned to room to perform some TE's following HEP handout.  Instructed on proper tech, freq as well as use of ICE.   Addressed all mobility questions, discussed appropriate activity, educated on use of ICE.  Pt ready for D/C to home.    If plan is discharge home, recommend the following: A little help with walking and/or transfers;A little help with bathing/dressing/bathroom;Assistance with cooking/housework;Assist for transportation;Help with stairs or ramp for entrance   Can travel by private vehicle        Equipment Recommendations  Rolling walker (2 wheels)    Recommendations for Other Services       Precautions / Restrictions Precautions Precautions: Fall Restrictions Weight Bearing Restrictions Per Provider Order: No Other Position/Activity Restrictions: WBAT     Mobility  Bed Mobility     General bed mobility comments: OOB in recliner    Transfers Overall transfer level: Needs assistance Equipment used: Rolling walker (2 wheels) Transfers: Sit to/from Stand Sit to Stand: Supervision           General transfer comment: <25% VC's on proper tech, proper hand placement and safety with walker.    Ambulation/Gait Ambulation/Gait assistance: Supervision Gait Distance (Feet): 75 Feet Assistive device: Rolling walker (2 wheels) Gait Pattern/deviations: Step-to pattern, Decreased stance time - right Gait velocity:  decreased     General Gait Details: tolerated an increased distance with no c/o dizziness "feeling better" BP 119/75 HR 88   Stairs  Wheelchair Mobility     Tilt Bed    Modified Rankin (Stroke Patients Only)       Balance                                            Cognition Arousal: Alert Behavior During Therapy: WFL for tasks assessed/performed Overall Cognitive Status: Within Functional Limits for tasks assessed                                 General Comments: AxO x 3 pleasant/motivated        Exercises  Total Hip Replacement TE's following HEP Handout 10 reps ankle pumps 05 reps knee presses 05 reps heel slides 05 reps SAQ's 05 reps ABD Instructed how to use a belt loop to assist  Followed by ICE     General Comments        Pertinent Vitals/Pain Pain Assessment Pain Assessment: 0-10 Pain Score: 5  Pain Location: R hip Pain Descriptors / Indicators: Aching, Grimacing, Operative site guarding, Sore Pain Intervention(s): Monitored during session, Premedicated before session, Repositioned, Ice applied    Home Living                          Prior Function  PT Goals (current goals can now be found in the care plan section) Progress towards PT goals: Progressing toward goals    Frequency    7X/week      PT Plan      Co-evaluation              AM-PAC PT "6 Clicks" Mobility   Outcome Measure  Help needed turning from your back to your side while in a flat bed without using bedrails?: A Little Help needed moving from lying on your back to sitting on the side of a flat bed without using bedrails?: A Little Help needed moving to and from a bed to a chair (including a wheelchair)?: A Little Help needed standing up from a chair using your arms (e.g., wheelchair or bedside chair)?: A Little Help needed to walk in hospital room?: A Little Help needed climbing 3-5 steps with a railing? :  A Lot 6 Click Score: 17    End of Session Equipment Utilized During Treatment: Gait belt Activity Tolerance: Patient limited by fatigue Patient left: in chair;with call bell/phone within reach;with chair alarm set;with family/visitor present Nurse Communication: Mobility status PT Visit Diagnosis: Unsteadiness on feet (R26.81);Other abnormalities of gait and mobility (R26.89);Muscle weakness (generalized) (M62.81);Difficulty in walking, not elsewhere classified (R26.2);Pain Pain - Right/Left: Right Pain - part of body: Hip;Leg     Time: 1610-9604 PT Time Calculation (min) (ACUTE ONLY): 25 min  Charges:    $Gait Training: 8-22 mins $Therapeutic Exercise: 8-22 mins $Therapeutic Activity: 8-22 mins PT General Charges $$ ACUTE PT VISIT: 1 Visit                     Felecia Shelling  PTA Acute  Rehabilitation Services Office M-F          [phone]

## 2022-12-21 NOTE — Progress Notes (Signed)
Physical Therapy Treatment Patient Details Name: Gary [name] MRN: [id number] DOB: Mar 25, 1955 Today's Date: 12/21/2022   History of Present Illness 67 yo male presents to therapy s/p R THA, anterior approach on 12/19/2022 due to failure of conservative measures. Pt PMH includes but is not limited to: OSA on CPAP, hydronephrosis, renal stones, PAF, HTN, gout, and CAD s/p CABG x 6.    PT Comments  POD # 3 am session Spouse present.  Assisted OOB to amb a short distance in hallway.  General Gait Details: limited amb distance due to mild c/o dizziness BP 108/72 HR 84 "not as bad as yesterday" stated pt but still feels "a little fuzzy".  Assisted to recliner.  Practiced stairs. General stair comments: with Spouse present "hands on" asisst under Therapist direction on proper tech up/down 2 steps forward using walker.  Pt did not feel well after, required an extended rest break. Returned to room. Pt will need another PT session this afternoon to ensure a safe D/C later today.    If plan is discharge home, recommend the following: A little help with walking and/or transfers;A little help with bathing/dressing/bathroom;Assistance with cooking/housework;Assist for transportation;Help with stairs or ramp for entrance   Can travel by private vehicle        Equipment Recommendations  Rolling walker (2 wheels)    Recommendations for Other Services       Precautions / Restrictions Precautions Precautions: Fall Restrictions Weight Bearing Restrictions Per Provider Order: No Other Position/Activity Restrictions: WBAT     Mobility  Bed Mobility Overal bed mobility: Needs Assistance Bed Mobility: Supine to Sit     Supine to sit: Supervision, Contact guard     General bed mobility comments: demonstarted and instructed how to use belt to self asisst LE off bed.    Transfers Overall transfer level: Needs assistance Equipment used: Rolling walker (2 wheels) Transfers: Sit to/from Stand Sit  to Stand: Contact guard assist, Supervision           General transfer comment: <25% VC's on proper tech, proper hand placement and safety with walker.    Ambulation/Gait Ambulation/Gait assistance: Supervision, Contact guard assist Gait Distance (Feet): 22 Feet Assistive device: Rolling walker (2 wheels) Gait Pattern/deviations: Step-to pattern, Decreased stance time - right Gait velocity: decreased     General Gait Details: limited amb distance due to mild c/o dizziness BP 108/72 HR 84 "not as bad as yesterday" stated pt but still feels "a little fuzzy".  Assisted to recliner   Stairs Stairs: Yes Stairs assistance: Min assist Stair Management: No rails, Step to pattern, Forwards, With walker Number of Stairs: 2 General stair comments: with Spouse present "hands on" asisst under Therapist direction on proper tech up/down 2 steps forward using walker.  Pt did not feel well after, required an extended rest break.   Wheelchair Mobility     Tilt Bed    Modified Rankin (Stroke Patients Only)       Balance                                            Cognition Arousal: Alert Behavior During Therapy: WFL for tasks assessed/performed Overall Cognitive Status: Within Functional Limits for tasks assessed  General Comments: AxO x 3 pleasant/motivated        Exercises      General Comments        Pertinent Vitals/Pain Pain Assessment Pain Assessment: 0-10 Pain Score: 5  Pain Location: R hip Pain Descriptors / Indicators: Aching, Grimacing, Operative site guarding, Sore Pain Intervention(s): Monitored during session, Premedicated before session, Repositioned, Ice applied    Home Living                          Prior Function            PT Goals (current goals can now be found in the care plan section) Progress towards PT goals: Progressing toward goals    Frequency     7X/week      PT Plan      Co-evaluation              AM-PAC PT "6 Clicks" Mobility   Outcome Measure  Help needed turning from your back to your side while in a flat bed without using bedrails?: A Little Help needed moving from lying on your back to sitting on the side of a flat bed without using bedrails?: A Little Help needed moving to and from a bed to a chair (including a wheelchair)?: A Little Help needed standing up from a chair using your arms (e.g., wheelchair or bedside chair)?: A Little Help needed to walk in hospital room?: A Little Help needed climbing 3-5 steps with a railing? : A Lot 6 Click Score: 17    End of Session Equipment Utilized During Treatment: Gait belt Activity Tolerance: Patient limited by fatigue Patient left: in chair;with call bell/phone within reach;with chair alarm set;with family/visitor present Nurse Communication: Mobility status PT Visit Diagnosis: Unsteadiness on feet (R26.81);Other abnormalities of gait and mobility (R26.89);Muscle weakness (generalized) (M62.81);Difficulty in walking, not elsewhere classified (R26.2);Pain Pain - Right/Left: Right Pain - part of body: Hip;Leg     Time: 1015-1100 PT Time Calculation (min) (ACUTE ONLY): 45 min  Charges:    $Gait Training: 8-22 mins $Therapeutic Exercise: 8-22 mins $Therapeutic Activity: 8-22 mins PT General Charges $$ ACUTE PT VISIT: 1 Visit                    Felecia Shelling  PTA Acute  Rehabilitation Services Office M-F          [phone]

## 2022-12-21 NOTE — Progress Notes (Signed)
Subjective: 2 Days Post-Op Procedure(s) (LRB): RIGHT TOTAL HIP ARTHROPLASTY ANTERIOR APPROACH (Right) Patient is feeling better this morning. He is hoping to go home today. His bandage has stopped bleeding.  Activity level:  wbat Diet tolerance:  ok Voiding:  ok Patient reports pain as mild.    Objective: Vital signs in last 24 hours: Temp:  [97.6 F (36.4 C)-98.9 F (37.2 C)] 98.3 F (36.8 C) (12/12 0557) Pulse Rate:  [77-93] 85 (12/12 0557) Resp:  [16-18] 18 (12/12 0557) BP: (114-132)/(71-91) 132/91 (12/12 0557) SpO2:  [96 %-100 %] 98 % (12/12 0557)  Labs: Recent Labs    12/19/22 1617 12/20/22 0337  HGB 14.7 13.4   Recent Labs    12/19/22 1617 12/20/22 0337  WBC  --  13.4*  RBC  --  3.98*  HCT 42.7 39.5  PLT  --  178   No results for input(s): "NA", "K", "CL", "CO2", "BUN", "CREATININE", "GLUCOSE", "CALCIUM" in the last 72 hours. No results for input(s): "LABPT", "INR" in the last 72 hours.  Physical Exam:  Neurologically intact ABD soft Neurovascular intact Sensation intact distally Intact pulses distally Dorsiflexion/Plantar flexion intact Incision: dressing C/D/I and no drainage No cellulitis present Compartment soft  Assessment/Plan:  2 Days Post-Op Procedure(s) (LRB): RIGHT TOTAL HIP ARTHROPLASTY ANTERIOR APPROACH (Right) Advance diet Up with therapy D/C IV fluids Discharge home with home health today if cleared by PT and doing well. Follow up in office 2 weeks post op. Continue home eliquis.  Gary [name] 12/21/2022, 6:55 AM

## 2023-01-04 ENCOUNTER — Other Ambulatory Visit: Payer: Self-pay | Admitting: Cardiology

## 2023-01-04 DIAGNOSIS — E7800 Pure hypercholesterolemia, unspecified: Secondary | ICD-10-CM

## 2023-01-22 DIAGNOSIS — E782 Mixed hyperlipidemia: Secondary | ICD-10-CM | POA: Diagnosis not present

## 2023-01-22 DIAGNOSIS — N182 Chronic kidney disease, stage 2 (mild): Secondary | ICD-10-CM | POA: Diagnosis not present

## 2023-01-22 DIAGNOSIS — I480 Paroxysmal atrial fibrillation: Secondary | ICD-10-CM | POA: Diagnosis not present

## 2023-01-22 DIAGNOSIS — I2510 Atherosclerotic heart disease of native coronary artery without angina pectoris: Secondary | ICD-10-CM | POA: Diagnosis not present

## 2023-01-22 DIAGNOSIS — I10 Essential (primary) hypertension: Secondary | ICD-10-CM | POA: Diagnosis not present

## 2023-01-29 DIAGNOSIS — E782 Mixed hyperlipidemia: Secondary | ICD-10-CM | POA: Diagnosis not present

## 2023-01-29 DIAGNOSIS — M1A0790 Idiopathic chronic gout, unspecified ankle and foot, without tophus (tophi): Secondary | ICD-10-CM | POA: Diagnosis not present

## 2023-01-29 DIAGNOSIS — I2510 Atherosclerotic heart disease of native coronary artery without angina pectoris: Secondary | ICD-10-CM | POA: Diagnosis not present

## 2023-01-29 DIAGNOSIS — I10 Essential (primary) hypertension: Secondary | ICD-10-CM | POA: Diagnosis not present

## 2023-01-29 DIAGNOSIS — N182 Chronic kidney disease, stage 2 (mild): Secondary | ICD-10-CM | POA: Diagnosis not present

## 2023-01-29 DIAGNOSIS — I480 Paroxysmal atrial fibrillation: Secondary | ICD-10-CM | POA: Diagnosis not present

## 2023-02-12 ENCOUNTER — Ambulatory Visit (HOSPITAL_COMMUNITY)
Admission: RE | Admit: 2023-02-12 | Discharge: 2023-02-12 | Disposition: A | Discharge status: Home / Self Care | Payer: Medicare Other | Source: Ambulatory Visit | Attending: Cardiovascular Disease | Admitting: Cardiovascular Disease

## 2023-02-12 DIAGNOSIS — I723 Aneurysm of iliac artery: Secondary | ICD-10-CM | POA: Diagnosis not present

## 2023-02-13 ENCOUNTER — Encounter: Payer: Self-pay | Admitting: *Deleted

## 2023-02-26 DIAGNOSIS — H348322 Tributary (branch) retinal vein occlusion, left eye, stable: Secondary | ICD-10-CM | POA: Diagnosis not present

## 2023-02-26 DIAGNOSIS — H2513 Age-related nuclear cataract, bilateral: Secondary | ICD-10-CM | POA: Diagnosis not present

## 2023-02-26 DIAGNOSIS — H33302 Unspecified retinal break, left eye: Secondary | ICD-10-CM | POA: Diagnosis not present

## 2023-03-12 DIAGNOSIS — L988 Other specified disorders of the skin and subcutaneous tissue: Secondary | ICD-10-CM | POA: Diagnosis not present

## 2023-03-12 DIAGNOSIS — Z85828 Personal history of other malignant neoplasm of skin: Secondary | ICD-10-CM | POA: Diagnosis not present

## 2023-03-12 DIAGNOSIS — L905 Scar conditions and fibrosis of skin: Secondary | ICD-10-CM | POA: Diagnosis not present

## 2023-03-12 DIAGNOSIS — D225 Melanocytic nevi of trunk: Secondary | ICD-10-CM | POA: Diagnosis not present

## 2023-03-12 DIAGNOSIS — Z85820 Personal history of malignant melanoma of skin: Secondary | ICD-10-CM | POA: Diagnosis not present

## 2023-03-12 DIAGNOSIS — D485 Neoplasm of uncertain behavior of skin: Secondary | ICD-10-CM | POA: Diagnosis not present

## 2023-03-12 DIAGNOSIS — L821 Other seborrheic keratosis: Secondary | ICD-10-CM | POA: Diagnosis not present

## 2023-03-12 DIAGNOSIS — D1801 Hemangioma of skin and subcutaneous tissue: Secondary | ICD-10-CM | POA: Diagnosis not present

## 2023-03-12 DIAGNOSIS — L570 Actinic keratosis: Secondary | ICD-10-CM | POA: Diagnosis not present

## 2023-03-19 DIAGNOSIS — M25559 Pain in unspecified hip: Secondary | ICD-10-CM | POA: Diagnosis not present

## 2023-04-17 DIAGNOSIS — Z87442 Personal history of urinary calculi: Secondary | ICD-10-CM | POA: Diagnosis not present

## 2023-04-17 DIAGNOSIS — R350 Frequency of micturition: Secondary | ICD-10-CM | POA: Diagnosis not present

## 2023-04-17 DIAGNOSIS — R310 Gross hematuria: Secondary | ICD-10-CM | POA: Diagnosis not present

## 2023-04-18 ENCOUNTER — Encounter: Payer: Self-pay | Admitting: Cardiology

## 2023-04-20 DIAGNOSIS — M1611 Unilateral primary osteoarthritis, right hip: Secondary | ICD-10-CM | POA: Diagnosis not present

## 2023-05-04 ENCOUNTER — Encounter: Payer: Self-pay | Admitting: Hematology & Oncology

## 2023-05-04 ENCOUNTER — Inpatient Hospital Stay: Attending: Hematology & Oncology

## 2023-05-04 ENCOUNTER — Inpatient Hospital Stay: Admitting: Hematology & Oncology

## 2023-05-04 VITALS — BP 129/69 | HR 58 | Temp 97.7°F | Resp 20 | Ht 74.0 in | Wt 257.1 lb

## 2023-05-04 DIAGNOSIS — M1611 Unilateral primary osteoarthritis, right hip: Secondary | ICD-10-CM

## 2023-05-04 DIAGNOSIS — M899 Disorder of bone, unspecified: Secondary | ICD-10-CM | POA: Insufficient documentation

## 2023-05-04 DIAGNOSIS — R634 Abnormal weight loss: Secondary | ICD-10-CM | POA: Diagnosis not present

## 2023-05-04 DIAGNOSIS — Z87891 Personal history of nicotine dependence: Secondary | ICD-10-CM | POA: Diagnosis not present

## 2023-05-04 DIAGNOSIS — M898X5 Other specified disorders of bone, thigh: Secondary | ICD-10-CM

## 2023-05-04 DIAGNOSIS — R937 Abnormal findings on diagnostic imaging of other parts of musculoskeletal system: Secondary | ICD-10-CM

## 2023-05-04 DIAGNOSIS — Z85820 Personal history of malignant melanoma of skin: Secondary | ICD-10-CM | POA: Insufficient documentation

## 2023-05-04 LAB — CBC WITH DIFFERENTIAL (CANCER CENTER ONLY)
Abs Immature Granulocytes: 0.01 10*3/uL (ref 0.00–0.07)
Basophils Absolute: 0 10*3/uL (ref 0.0–0.1)
Basophils Relative: 1 %
Eosinophils Absolute: 0.1 10*3/uL (ref 0.0–0.5)
Eosinophils Relative: 1 %
HCT: 40.3 % (ref 39.0–52.0)
Hemoglobin: 13.3 g/dL (ref 13.0–17.0)
Immature Granulocytes: 0 %
Lymphocytes Relative: 19 %
Lymphs Abs: 1 10*3/uL (ref 0.7–4.0)
MCH: 32.8 pg (ref 26.0–34.0)
MCHC: 33 g/dL (ref 30.0–36.0)
MCV: 99.3 fL (ref 80.0–100.0)
Monocytes Absolute: 0.5 10*3/uL (ref 0.1–1.0)
Monocytes Relative: 9 %
Neutro Abs: 3.8 10*3/uL (ref 1.7–7.7)
Neutrophils Relative %: 70 %
Platelet Count: 229 10*3/uL (ref 150–400)
RBC: 4.06 MIL/uL — ABNORMAL LOW (ref 4.22–5.81)
RDW: 13.9 % (ref 11.5–15.5)
WBC Count: 5.5 10*3/uL (ref 4.0–10.5)
nRBC: 0 % (ref 0.0–0.2)

## 2023-05-04 LAB — CMP (CANCER CENTER ONLY)
ALT: 23 U/L (ref 0–44)
AST: 21 U/L (ref 15–41)
Albumin: 4.1 g/dL (ref 3.5–5.0)
Alkaline Phosphatase: 64 U/L (ref 38–126)
Anion gap: 7 (ref 5–15)
BUN: 20 mg/dL (ref 8–23)
CO2: 27 mmol/L (ref 22–32)
Calcium: 9.2 mg/dL (ref 8.9–10.3)
Chloride: 106 mmol/L (ref 98–111)
Creatinine: 1 mg/dL (ref 0.61–1.24)
GFR, Estimated: >60 mL/min (ref >60)
Glucose, Bld: 98 mg/dL (ref 70–99)
Potassium: 4.6 mmol/L (ref 3.5–5.1)
Sodium: 140 mmol/L (ref 135–145)
Total Bilirubin: 1.1 mg/dL (ref 0.0–1.2)
Total Protein: 6.1 g/dL — ABNORMAL LOW (ref 6.5–8.1)

## 2023-05-04 LAB — CEA (ACCESS): CEA (CHCC): 1.11 ng/mL (ref 0.00–5.00)

## 2023-05-04 NOTE — Progress Notes (Signed)
 Referral MD  Reason for Referral: Abnormal MRI of the pelvis  Chief Complaint  Patient presents with   New Patient (Initial Visit)    Abnormal MRI of right hip.  : There is a lesion MRI pelvis that no one knows what it is.    HPI: Gary [name] is a very nice 68 year old white male.  He is originally from [place] .  He has he went to college in [place] .  He has been down in [place]  for 40 years.  He recently had right hip surgery.  This was done by Dr. Dalldorf.  This was done in December.  Apparently, he has been having MRIs which have shown an abnormality in the pelvis.  There is a stable 10 mm lesion in the right pubic bone adjacent to the pubic symphysis.  It is recommended that he have a follow-up MRI in 6 months.  He does have a history of melanoma.  This was in the right lower thigh.  He underwent excisional removal.  There is a history of melanoma in his family.  His younger brother died of melanoma.  Of note, he did have 6 bypass heart surgery several years ago.  He did develop atrial fibrillation afterwards.  It was felt that he needed to see us  at the [facility] to try to help with any diagnosis.  He did have a history of tobacco use.  He said at most to smoking 3 packs a day.  He stopped 30 years ago.  He does have occasional adult beverage use.  He has had a little bit of weight loss.  This was intentional because of the possibility of prediabetes.  He has had no fever.  He has had no problems with COVID.  He does have I think colonic polyps and has colonoscopies every 3 years.  There has been no rashes.  He has had no bleeding.  He has had no cough or shortness of breath.  He has had no nausea or vomiting.  Overall, I would say his performance status is probably ECOG 0.    Past Medical History:  Diagnosis Date   Arthritis    CAD (coronary artery disease) 2015   CABGx6   Cancer (HCC)    melanoma on right leg,  Basal cell  on right upper chest.   Dysrhythmia    a.fib   Gout    History of kidney stones    Kidney stones    Obesity (BMI 30-39.9)    PAF (paroxysmal atrial fibrillation) (HCC)    on Eliquis    S/P CABG x 6    Sleep apnea    uses CPAP nightly  :   Past Surgical History:  Procedure Laterality Date   CORONARY ARTERY BYPASS GRAFT N/A 04/18/2013   Procedure: CORONARY ARTERY BYPASS GRAFTING (CABG) TIMES SIX USING LEFT INTERNAL MAMMARY ARTERY AND RIGHT SAPHENOUS LEG VEIN HARVESTED ENDOSCOPICALLY;  Surgeon: Norita Beauvais, MD;  Location: [facility] OR;  Service: Open Heart Surgery;  Laterality: N/A;   EYE SURGERY Bilateral    lasik surgery   EYE SURGERY Right    retina laser surgery   EYE SURGERY Left    injections to retina,   stopped getting 2020   INTRAOPERATIVE TRANSESOPHAGEAL ECHOCARDIOGRAM N/A 04/18/2013   Procedure: INTRAOPERATIVE TRANSESOPHAGEAL ECHOCARDIOGRAM;  Surgeon: Norita Beauvais, MD;  Location: [facility] OR;  Service: Open Heart Surgery;  Laterality: N/A;   KIDNEY STONE SURGERY     LEFT AND RIGHT HEART CATHETERIZATION WITH  CORONARY ANGIOGRAM N/A 04/16/2013   Procedure: LEFT AND RIGHT HEART CATHETERIZATION WITH CORONARY ANGIOGRAM;  Surgeon: Dorsey Gault, MD;  Location: [facility] CATH LAB;  Service: Cardiovascular;  Laterality: N/A;   LESION EXCISION     basal cell removed right upper chest.   LESION EXCISION WITH COMPLEX REPAIR Right 08/17/2021   Procedure: LESION EXCISION OF RIGHT LEG WITH COMPLEX REPAIR AND APPLICATION WITH A CELL;  Surgeon: Elidia Grout, MD;  Location: [facility];  Service: Plastics;  Laterality: Right;  LOCAL   NEPHROLITHOTOMY Right 05/28/2014   Procedure: NEPHROLITHOTOMY PERCUTANEOUS;  Surgeon: Trent Frizzle, MD;  Location: [facility] ORS;  Service: Urology;  Laterality: Right;   TONSILLECTOMY     TOTAL HIP ARTHROPLASTY Right 12/19/2022   Procedure: RIGHT TOTAL HIP ARTHROPLASTY ANTERIOR APPROACH;  Surgeon: Dayne Even, MD;  Location: [facility] ORS;  Service:  Orthopedics;  Laterality: Right;  :   Current Outpatient Medications:    allopurinol  (ZYLOPRIM ) 100 MG tablet, Take 100 mg by mouth daily., Disp: , Rfl:    Ascorbic Acid  (VITAMIN C ) 1000 MG tablet, Take 1,000 mg by mouth daily., Disp: , Rfl:    atorvastatin  (LIPITOR) 80 MG tablet, TAKE 1 TABLET BY MOUTH EVERY DAY AT 6PM, Disp: 90 tablet, Rfl: 1   ELIQUIS  5 MG TABS tablet, TAKE 1 TABLET BY MOUTH TWICE A DAY, Disp: 60 tablet, Rfl: 5   Multiple Vitamins-Minerals (CENTRUM SILVER PO), Take 1 tablet by mouth every morning. , Disp: , Rfl:    Polyvinyl Alcohol -Povidone (REFRESH OP), Place 1 drop into both eyes 2 (two) times daily as needed (dry eyes)., Disp: , Rfl:    tamsulosin  (FLOMAX ) 0.4 MG CAPS capsule, Take 0.4 mg by mouth every morning., Disp: , Rfl: :  :   Allergies  Allergen Reactions   Contrast Media [Iodinated Contrast Media] Hives, Shortness Of Breath and Nausea And Vomiting  :   Family History  Problem Relation Age of Onset   Heart attack Father    Pancreatic cancer Father    Melanoma Brother   :   Social History   Socioeconomic History   Marital status: Married    Spouse name: Not on file   Number of children: Not on file   Years of education: Not on file   Highest education level: Not on file  Occupational History   Not on file  Tobacco Use   Smoking status: Former    Current packs/day: 0.00    Types: Cigarettes    Start date: 05/12/1969    Quit date: 05/13/1994    Years since quitting: 28.9    Passive exposure: Never   Smokeless tobacco: Never  Vaping Use   Vaping status: Never Used  Substance and Sexual Activity   Alcohol  use: Yes    Comment: occasional   Drug use: No   Sexual activity: Yes  Other Topics Concern   Not on file  Social History Narrative   ** Merged History Encounter **       Social Drivers of Health   Financial Resource Strain: Low Risk  (05/04/2023)   Overall Financial Resource Strain (CARDIA)    Difficulty of Paying Living  Expenses: Not hard at all  Food Insecurity: No Food Insecurity (12/19/2022)   Hunger Vital Sign    Worried About Running Out of Food in the Last Year: Never true    Ran Out of Food in the Last Year: Never true  Transportation Needs: No Transportation Needs (12/19/2022)   PRAPARE - Transportation  Lack of Transportation (Medical): No    Lack of Transportation (Non-Medical): No  Physical Activity: Sufficiently Active (05/04/2023)   Exercise Vital Sign    Days of Exercise per Week: 7 days    Minutes of Exercise per Session: 120 min  Stress: No Stress Concern Present (05/04/2023)   [employer] of Occupational Health - Occupational Stress Questionnaire    Feeling of Stress : Not at all  Social Connections: Socially Integrated (05/04/2023)   Social Connection and Isolation Panel [NHANES]    Frequency of Communication with Friends and Family: More than three times a week    Frequency of Social Gatherings with Friends and Family: Three times a week    Attends Religious Services: 1 to 4 times per year    Active Member of Clubs or Organizations: Yes    Attends Banker Meetings: 1 to 4 times per year    Marital Status: Married  Catering manager Violence: Not At Risk (12/19/2022)   Humiliation, Afraid, Rape, and Kick questionnaire    Fear of Current or Ex-Partner: No    Emotionally Abused: No    Physically Abused: No    Sexually Abused: No  :  Review of Systems  Constitutional: Negative.   HENT: Negative.    Eyes: Negative.   Respiratory: Negative.    Cardiovascular: Negative.   Gastrointestinal: Negative.   Genitourinary: Negative.   Musculoskeletal: Negative.   Skin: Negative.   Neurological: Negative.   Endo/Heme/Allergies: Negative.   Psychiatric/Behavioral: Negative.       Exam:  Vital signs show temperature of 97.7.  Pulse 58.  Blood pressure 129/69.  Weight is 257 pounds.  @IPVITALS @ Physical Exam Vitals reviewed.  HENT:     Head: Normocephalic and  atraumatic.  Eyes:     Pupils: Pupils are equal, round, and reactive to light.  Cardiovascular:     Rate and Rhythm: Normal rate and regular rhythm.     Heart sounds: Normal heart sounds.  Pulmonary:     Effort: Pulmonary effort is normal.     Breath sounds: Normal breath sounds.  Abdominal:     General: Bowel sounds are normal.     Palpations: Abdomen is soft.  Musculoskeletal:        General: No tenderness or deformity. Normal range of motion.     Cervical back: Normal range of motion.  Lymphadenopathy:     Cervical: No cervical adenopathy.  Skin:    General: Skin is warm and dry.     Findings: No erythema or rash.  Neurological:     Mental Status: He is alert and oriented to person, place, and time.  Psychiatric:        Behavior: Behavior normal.        Thought Content: Thought content normal.        Judgment: Judgment normal.      Recent Labs    05/04/23 1059  WBC 5.5  HGB 13.3  HCT 40.3  PLT 229    Recent Labs    05/04/23 1059  NA 140  K 4.6  CL 106  CO2 27  GLUCOSE 98  BUN 20  CREATININE 1.00  CALCIUM  9.2    Blood smear review: None  Pathology: None    Assessment and Plan: Gary [name] is a very nice 68 year old white male.  He has this right pubic bone lesion.  It is only 10 mm.  It appears to be stable based on past MRIs.  Again I would think this  is not anything malignant given that this appears to be stable.  There is not appear to be any lesions elsewhere.  However, given the fact that he has a history of melanoma, this certainly would be troublesome and that we probably do need to get additional studies.  I would recommend a PET scan.  I think this would be very helpful.  If this lesion lies up on a PET scan or if there are other bony lesions or lesions elsewhere that are active, then we clearly will need make a diagnosis.  I had a long talk with Gary [name] and explained my recommendation.  He is in agreement with this.  We will try to  get the PET scan set up in a couple weeks or so.  I told him that we can try to take everything over the phone to make it easier for him.  If the PET scan is positive, we will definitely  have to get him back in and then talk to him as to how we can proceed.  I have to believe that this is not can to be malignant.

## 2023-05-05 ENCOUNTER — Other Ambulatory Visit: Payer: Self-pay | Admitting: Cardiology

## 2023-05-05 DIAGNOSIS — I480 Paroxysmal atrial fibrillation: Secondary | ICD-10-CM

## 2023-05-05 LAB — PSA, TOTAL AND FREE
PSA, Free Pct: 16.9 %
PSA, Free: 0.81 ng/mL
Prostate Specific Ag, Serum: 4.8 ng/mL — ABNORMAL HIGH (ref 0.0–4.0)

## 2023-05-07 NOTE — Telephone Encounter (Signed)
 Prescription refill request for Eliquis  received. Indication: PAF Last office visit: 07/24/22  Isobel Marie MD Scr: 1.00 on 05/04/23  Epic Age: 68 Weight: 123kg  Based on above findings Eliquis  5mg  twice daily is the appropriate dose.  Refill approved.

## 2023-05-08 DIAGNOSIS — R310 Gross hematuria: Secondary | ICD-10-CM | POA: Diagnosis not present

## 2023-05-15 LAB — THYROGLOBULIN LEVEL: Thyroglobulin: 14 ng/mL

## 2023-05-21 ENCOUNTER — Encounter (HOSPITAL_COMMUNITY)
Admission: RE | Admit: 2023-05-21 | Discharge: 2023-05-21 | Disposition: A | Discharge status: Home / Self Care | Source: Ambulatory Visit | Attending: Hematology & Oncology | Admitting: Hematology & Oncology

## 2023-05-21 DIAGNOSIS — Z85820 Personal history of malignant melanoma of skin: Secondary | ICD-10-CM | POA: Insufficient documentation

## 2023-05-21 DIAGNOSIS — M898X5 Other specified disorders of bone, thigh: Secondary | ICD-10-CM

## 2023-05-21 DIAGNOSIS — C4371 Malignant melanoma of right lower limb, including hip: Secondary | ICD-10-CM | POA: Diagnosis not present

## 2023-05-21 LAB — GLUCOSE, CAPILLARY: Glucose-Capillary: 120 mg/dL — ABNORMAL HIGH (ref 70–99)

## 2023-05-21 MED ORDER — FLUDEOXYGLUCOSE F - 18 (FDG) INJECTION
12.8000 | Freq: Once | INTRAVENOUS | Status: AC
  Administered 2023-05-21: 12.8 via INTRAVENOUS

## 2023-05-22 ENCOUNTER — Ambulatory Visit: Payer: Self-pay | Admitting: Hematology & Oncology

## 2023-05-23 NOTE — Telephone Encounter (Signed)
-----   Message from Gary [name] sent at 05/22/2023  4:55 PM EDT ----- Please call and let him know that the PET scan is negative for any obvious cancer.  This is certainly a great report.  I am happy about this.  [name]

## 2023-05-23 NOTE — Telephone Encounter (Signed)
 Advised via MyChart.

## 2023-05-28 NOTE — Progress Notes (Signed)
 The PET scan was to try and idenify if the lytic lesion in the bone was potentially cancerous.   Gary [name]

## 2023-06-28 ENCOUNTER — Other Ambulatory Visit: Payer: Self-pay | Admitting: Cardiology

## 2023-06-28 DIAGNOSIS — E7800 Pure hypercholesterolemia, unspecified: Secondary | ICD-10-CM

## 2023-07-18 DIAGNOSIS — Z0000 Encounter for general adult medical examination without abnormal findings: Secondary | ICD-10-CM | POA: Diagnosis not present

## 2023-07-18 DIAGNOSIS — I10 Essential (primary) hypertension: Secondary | ICD-10-CM | POA: Diagnosis not present

## 2023-07-18 DIAGNOSIS — I2510 Atherosclerotic heart disease of native coronary artery without angina pectoris: Secondary | ICD-10-CM | POA: Diagnosis not present

## 2023-07-18 DIAGNOSIS — N182 Chronic kidney disease, stage 2 (mild): Secondary | ICD-10-CM | POA: Diagnosis not present

## 2023-07-18 DIAGNOSIS — E782 Mixed hyperlipidemia: Secondary | ICD-10-CM | POA: Diagnosis not present

## 2023-07-18 LAB — LAB REPORT - SCANNED
Albumin, Urine POC: 15.2
Albumin/Creatinine Ratio, Urine, POC: 21
Creatinine, POC: 71.9 mg/dL
EGFR: 64

## 2023-07-18 NOTE — Progress Notes (Signed)
 HPI: Follow-up coronary artery disease and atrial fibrillation. Patient had coronary artery bypass and graft in April 2015 with LIMA to the LAD, saphenous vein graft to the diagonal, saphenous vein graft to the first and second marginal and saphenous vein graft to the acute marginal and distal circumflex. PreCABG dopplers showed 1-39 bilateral stenosis. Patient noted to have palpitations and rhythm strip on his apple watch showed atrial fibrillation. Started on apixaban . Echocardiogram September 2021 showed ejection fraction 50 to 55% and dilated ascending aorta at 40 mm. Monitor September 2021 showed sinus rhythm with occasional PAC, brief PAT, occasional PVC and rare couplet.  Follow-up Dopplers February 2025 showed no abdominal aortic aneurysm, left common iliac measuring 1.4 cm.  Since last seen, patient denies dyspnea, chest pain, palpitations or syncope.  No bleeding.  Current Outpatient Medications  Medication Sig Dispense Refill   allopurinol  (ZYLOPRIM ) 100 MG tablet Take 100 mg by mouth daily.     Ascorbic Acid  (VITAMIN C ) 1000 MG tablet Take 1,000 mg by mouth daily.     atorvastatin  (LIPITOR) 80 MG tablet TAKE 1 TABLET BY MOUTH EVERY DAY AT 6PM 30 tablet 0   ELIQUIS  5 MG TABS tablet TAKE 1 TABLET BY MOUTH TWICE A DAY 180 tablet 1   Multiple Vitamins-Minerals (CENTRUM SILVER PO) Take 1 tablet by mouth every morning.      Polyvinyl Alcohol -Povidone (REFRESH OP) Place 1 drop into both eyes 2 (two) times daily as needed (dry eyes).     tamsulosin  (FLOMAX ) 0.4 MG CAPS capsule Take 0.4 mg by mouth every morning.     No current facility-administered medications for this visit.     Past Medical History:  Diagnosis Date   Arthritis    CAD (coronary artery disease) 2015   CABGx6   Cancer (HCC)    melanoma on right leg,  Basal cell on right upper chest.   Dysrhythmia    a.fib   Gout    History of kidney stones    Kidney stones    Obesity (BMI 30-39.9)    PAF (paroxysmal atrial  fibrillation) (HCC)    on Eliquis    S/P CABG x 6    Sleep apnea    uses CPAP nightly    Past Surgical History:  Procedure Laterality Date   CORONARY ARTERY BYPASS GRAFT N/A 04/18/2013   Procedure: CORONARY ARTERY BYPASS GRAFTING (CABG) TIMES SIX USING LEFT INTERNAL MAMMARY ARTERY AND RIGHT SAPHENOUS LEG VEIN HARVESTED ENDOSCOPICALLY;  Surgeon: Dallas KATHEE Jude, MD;  Location: [facility] OR;  Service: Open Heart Surgery;  Laterality: N/A;   EYE SURGERY Bilateral    lasik surgery   EYE SURGERY Right    retina laser surgery   EYE SURGERY Left    injections to retina,   stopped getting 2020   INTRAOPERATIVE TRANSESOPHAGEAL ECHOCARDIOGRAM N/A 04/18/2013   Procedure: INTRAOPERATIVE TRANSESOPHAGEAL ECHOCARDIOGRAM;  Surgeon: Dallas KATHEE Jude, MD;  Location: [facility] OR;  Service: Open Heart Surgery;  Laterality: N/A;   KIDNEY STONE SURGERY     LEFT AND RIGHT HEART CATHETERIZATION WITH CORONARY ANGIOGRAM N/A 04/16/2013   Procedure: LEFT AND RIGHT HEART CATHETERIZATION WITH CORONARY ANGIOGRAM;  Surgeon: Elsie GORMAN Somerset, MD;  Location: [facility] CATH LAB;  Service: Cardiovascular;  Laterality: N/A;   LESION EXCISION     basal cell removed right upper chest.   LESION EXCISION WITH COMPLEX REPAIR Right 08/17/2021   Procedure: LESION EXCISION OF RIGHT LEG WITH COMPLEX REPAIR AND APPLICATION WITH A CELL;  Surgeon: Leora Lenis, MD;  Location: [facility];  Service: Plastics;  Laterality: Right;  LOCAL   NEPHROLITHOTOMY Right 05/28/2014   Procedure: NEPHROLITHOTOMY PERCUTANEOUS;  Surgeon: Garnette Shack, MD;  Location: [facility];  Service: Urology;  Laterality: Right;   TONSILLECTOMY     TOTAL HIP ARTHROPLASTY Right 12/19/2022   Procedure: RIGHT TOTAL HIP ARTHROPLASTY ANTERIOR APPROACH;  Surgeon: Sheril Coy, MD;  Location: [facility] ORS;  Service: Orthopedics;  Laterality: Right;    Social History   Socioeconomic History   Marital status: Married    Spouse name: Not on file   Number of children: Not  on file   Years of education: Not on file   Highest education level: Not on file  Occupational History   Not on file  Tobacco Use   Smoking status: Former    Current packs/day: 0.00    Types: Cigarettes    Start date: 05/12/1969    Quit date: 05/13/1994    Years since quitting: 29.2    Passive exposure: Never   Smokeless tobacco: Never  Vaping Use   Vaping status: Never Used  Substance and Sexual Activity   Alcohol  use: Yes    Comment: occasional   Drug use: No   Sexual activity: Yes  Other Topics Concern   Not on file  Social History Narrative   ** Merged History Encounter **       Social Drivers of Health   Financial Resource Strain: Low Risk  (05/04/2023)   Overall Financial Resource Strain (CARDIA)    Difficulty of Paying Living Expenses: Not hard at all  Food Insecurity: No Food Insecurity (12/19/2022)   Hunger Vital Sign    Worried About Running Out of Food in the Last Year: Never true    Ran Out of Food in the Last Year: Never true  Transportation Needs: No Transportation Needs (12/19/2022)   PRAPARE - Administrator, Civil Service (Medical): No    Lack of Transportation (Non-Medical): No  Physical Activity: Sufficiently Active (05/04/2023)   Exercise Vital Sign    Days of Exercise per Week: 7 days    Minutes of Exercise per Session: 120 min  Stress: No Stress Concern Present (05/04/2023)   [employer] of Occupational Health - Occupational Stress Questionnaire    Feeling of Stress : Not at all  Social Connections: Socially Integrated (05/04/2023)   Social Connection and Isolation Panel    Frequency of Communication with Friends and Family: More than three times a week    Frequency of Social Gatherings with Friends and Family: Three times a week    Attends Religious Services: 1 to 4 times per year    Active Member of Clubs or Organizations: Yes    Attends Banker Meetings: 1 to 4 times per year    Marital Status: Married  Careers information officer Violence: Not At Risk (12/19/2022)   Humiliation, Afraid, Rape, and Kick questionnaire    Fear of Current or Ex-Partner: No    Emotionally Abused: No    Physically Abused: No    Sexually Abused: No    Family History  Problem Relation Age of Onset   Heart attack Father    Pancreatic cancer Father    Melanoma Brother     ROS: no fevers or chills, productive cough, hemoptysis, dysphasia, odynophagia, melena, hematochezia, dysuria, hematuria, rash, seizure activity, orthopnea, PND, pedal edema, claudication. Remaining systems are negative.  Physical Exam: Well-developed well-nourished in no acute distress.  Skin is warm and dry.  HEENT is normal.  Neck is supple.  Chest is clear to auscultation with normal expansion.  Cardiovascular exam is irregular Abdominal exam nontender or distended. No masses palpated. Extremities show no edema. neuro grossly intact  EKG Interpretation Date/Time:  Tuesday July 31 2023 09:40:21 EDT Ventricular Rate:  61 PR Interval:    QRS Duration:  74 QT Interval:  388 QTC Calculation: 390 R Axis:   7  Text Interpretation: Atrial fibrillation with premature ventricular or aberrantly conducted complexes Nonspecific T wave abnormality Confirmed by Pietro Rogue ([id number]) on 07/31/2023 9:42:33 AM    A/P  1 paroxysmal atrial fibrillation-patient is in atrial fibrillation this morning.  Continue apixaban .  I discussed the options with him today.  He may be having paroxysmal atrial fibrillation but he is asymptomatic.  I will see him back in 3 months.  If he remains in atrial fibrillation then we will discuss rate control versus rhythm control at that time.  May be a candidate for antiarrhythmic therapy versus ablation.  Note his heart rate is controlled on no medications.  Will likely repeat echocardiogram when he returns in 3 months.  2 hypertension-patient's blood pressure is controlled.    3 hyperlipidemia-continue statin.    4 coronary artery  disease-continue statin.  He denies chest pain.  5 obstructive sleep apnea-continue CPAP.  Rogue Pietro, MD

## 2023-07-26 ENCOUNTER — Other Ambulatory Visit: Payer: Self-pay | Admitting: Cardiology

## 2023-07-26 DIAGNOSIS — E7800 Pure hypercholesterolemia, unspecified: Secondary | ICD-10-CM

## 2023-07-30 ENCOUNTER — Ambulatory Visit: Admitting: Cardiology

## 2023-07-30 DIAGNOSIS — N182 Chronic kidney disease, stage 2 (mild): Secondary | ICD-10-CM | POA: Diagnosis not present

## 2023-07-30 DIAGNOSIS — M1A0790 Idiopathic chronic gout, unspecified ankle and foot, without tophus (tophi): Secondary | ICD-10-CM | POA: Diagnosis not present

## 2023-07-30 DIAGNOSIS — I2510 Atherosclerotic heart disease of native coronary artery without angina pectoris: Secondary | ICD-10-CM | POA: Diagnosis not present

## 2023-07-30 DIAGNOSIS — I480 Paroxysmal atrial fibrillation: Secondary | ICD-10-CM | POA: Diagnosis not present

## 2023-07-30 DIAGNOSIS — I10 Essential (primary) hypertension: Secondary | ICD-10-CM | POA: Diagnosis not present

## 2023-07-30 DIAGNOSIS — Z0000 Encounter for general adult medical examination without abnormal findings: Secondary | ICD-10-CM | POA: Diagnosis not present

## 2023-07-30 DIAGNOSIS — E782 Mixed hyperlipidemia: Secondary | ICD-10-CM | POA: Diagnosis not present

## 2023-07-31 ENCOUNTER — Encounter: Payer: Self-pay | Admitting: Cardiology

## 2023-07-31 ENCOUNTER — Ambulatory Visit: Attending: Cardiology | Admitting: Cardiology

## 2023-07-31 VITALS — BP 118/81 | HR 61 | Ht 74.0 in | Wt 260.0 lb

## 2023-07-31 DIAGNOSIS — I2510 Atherosclerotic heart disease of native coronary artery without angina pectoris: Secondary | ICD-10-CM | POA: Diagnosis not present

## 2023-07-31 DIAGNOSIS — I480 Paroxysmal atrial fibrillation: Secondary | ICD-10-CM | POA: Diagnosis not present

## 2023-07-31 DIAGNOSIS — E7800 Pure hypercholesterolemia, unspecified: Secondary | ICD-10-CM | POA: Diagnosis not present

## 2023-07-31 DIAGNOSIS — I10 Essential (primary) hypertension: Secondary | ICD-10-CM | POA: Diagnosis not present

## 2023-07-31 NOTE — Patient Instructions (Signed)
  Follow-Up: At [facility], you and your health needs are our priority.  As part of our continuing mission to provide you with exceptional heart care, our providers are all part of one team.  This team includes your primary Cardiologist (physician) and Advanced Practice Providers or APPs (Physician Assistants and Nurse Practitioners) who all work together to provide you with the care you need, when you need it.  Your next appointment:   3 month(s)  Provider:   Alexandria Angel, MD

## 2023-08-01 ENCOUNTER — Encounter: Payer: Self-pay | Admitting: Cardiology

## 2023-09-17 DIAGNOSIS — D225 Melanocytic nevi of trunk: Secondary | ICD-10-CM | POA: Diagnosis not present

## 2023-09-17 DIAGNOSIS — L905 Scar conditions and fibrosis of skin: Secondary | ICD-10-CM | POA: Diagnosis not present

## 2023-09-17 DIAGNOSIS — H43393 Other vitreous opacities, bilateral: Secondary | ICD-10-CM | POA: Diagnosis not present

## 2023-09-17 DIAGNOSIS — Z85820 Personal history of malignant melanoma of skin: Secondary | ICD-10-CM | POA: Diagnosis not present

## 2023-09-17 DIAGNOSIS — H2513 Age-related nuclear cataract, bilateral: Secondary | ICD-10-CM | POA: Diagnosis not present

## 2023-09-17 DIAGNOSIS — H348322 Tributary (branch) retinal vein occlusion, left eye, stable: Secondary | ICD-10-CM | POA: Diagnosis not present

## 2023-09-17 DIAGNOSIS — Z85828 Personal history of other malignant neoplasm of skin: Secondary | ICD-10-CM | POA: Diagnosis not present

## 2023-09-17 DIAGNOSIS — L821 Other seborrheic keratosis: Secondary | ICD-10-CM | POA: Diagnosis not present

## 2023-09-17 DIAGNOSIS — H35371 Puckering of macula, right eye: Secondary | ICD-10-CM | POA: Diagnosis not present

## 2023-09-17 DIAGNOSIS — H43813 Vitreous degeneration, bilateral: Secondary | ICD-10-CM | POA: Diagnosis not present

## 2023-09-17 DIAGNOSIS — D2261 Melanocytic nevi of right upper limb, including shoulder: Secondary | ICD-10-CM | POA: Diagnosis not present

## 2023-09-17 DIAGNOSIS — H35033 Hypertensive retinopathy, bilateral: Secondary | ICD-10-CM | POA: Diagnosis not present

## 2023-09-17 DIAGNOSIS — D2262 Melanocytic nevi of left upper limb, including shoulder: Secondary | ICD-10-CM | POA: Diagnosis not present

## 2023-09-17 DIAGNOSIS — L820 Inflamed seborrheic keratosis: Secondary | ICD-10-CM | POA: Diagnosis not present

## 2023-09-17 DIAGNOSIS — H31092 Other chorioretinal scars, left eye: Secondary | ICD-10-CM | POA: Diagnosis not present

## 2023-10-03 ENCOUNTER — Other Ambulatory Visit: Payer: Self-pay | Admitting: Cardiology

## 2023-10-03 DIAGNOSIS — E7800 Pure hypercholesterolemia, unspecified: Secondary | ICD-10-CM

## 2023-10-08 NOTE — Progress Notes (Signed)
 HPI: Follow-up coronary artery disease and atrial fibrillation. Patient had coronary artery bypass and graft in April 2015 with LIMA to the LAD, saphenous vein graft to the diagonal, saphenous vein graft to the first and second marginal and saphenous vein graft to the acute marginal and distal circumflex. PreCABG dopplers showed 1-39 bilateral stenosis. Patient noted to have palpitations and rhythm strip on his apple watch showed atrial fibrillation. Started on apixaban . Echocardiogram September 2021 showed ejection fraction 50 to 55% and dilated ascending aorta at 40 mm. Monitor September 2021 showed sinus rhythm with occasional PAC, brief PAT, occasional PVC and rare couplet.  Follow-up Dopplers February 2025 showed no abdominal aortic aneurysm, left common iliac measuring 1.4 cm.  Patient in atrial fibrillation at last office visit.  Since last seen, patient occasionally has palpitations in bed but denies increased dyspnea, fatigue, chest pain, bleeding or syncope.  Current Outpatient Medications  Medication Sig Dispense Refill   allopurinol  (ZYLOPRIM ) 100 MG tablet Take 100 mg by mouth daily.     Ascorbic Acid  (VITAMIN C ) 1000 MG tablet Take 1,000 mg by mouth daily.     atorvastatin  (LIPITOR) 80 MG tablet TAKE ONE TABLET BY MOUTH ONE TIME DAILY AT 06:00PM 90 tablet 3   ELIQUIS  5 MG TABS tablet TAKE 1 TABLET BY MOUTH TWICE A DAY 180 tablet 1   Multiple Vitamins-Minerals (CENTRUM SILVER PO) Take 1 tablet by mouth every morning.      Polyvinyl Alcohol -Povidone (REFRESH OP) Place 1 drop into both eyes 2 (two) times daily as needed (dry eyes).     tamsulosin  (FLOMAX ) 0.4 MG CAPS capsule Take 0.4 mg by mouth every morning.     No current facility-administered medications for this visit.     Past Medical History:  Diagnosis Date   Arthritis    CAD (coronary artery disease) 2015   CABGx6   Cancer (HCC)    melanoma on right leg,  Basal cell on right upper chest.   Dysrhythmia    a.fib    Gout    History of kidney stones    Kidney stones    Obesity (BMI 30-39.9)    PAF (paroxysmal atrial fibrillation) (HCC)    on Eliquis    S/P CABG x 6    Sleep apnea    uses CPAP nightly    Past Surgical History:  Procedure Laterality Date   CORONARY ARTERY BYPASS GRAFT N/A 04/18/2013   Procedure: CORONARY ARTERY BYPASS GRAFTING (CABG) TIMES SIX USING LEFT INTERNAL MAMMARY ARTERY AND RIGHT SAPHENOUS LEG VEIN HARVESTED ENDOSCOPICALLY;  Surgeon: Dallas KATHEE Jude, MD;  Location: [facility] OR;  Service: Open Heart Surgery;  Laterality: N/A;   EYE SURGERY Bilateral    lasik surgery   EYE SURGERY Right    retina laser surgery   EYE SURGERY Left    injections to retina,   stopped getting 2020   INTRAOPERATIVE TRANSESOPHAGEAL ECHOCARDIOGRAM N/A 04/18/2013   Procedure: INTRAOPERATIVE TRANSESOPHAGEAL ECHOCARDIOGRAM;  Surgeon: Dallas KATHEE Jude, MD;  Location: [facility] OR;  Service: Open Heart Surgery;  Laterality: N/A;   KIDNEY STONE SURGERY     LEFT AND RIGHT HEART CATHETERIZATION WITH CORONARY ANGIOGRAM N/A 04/16/2013   Procedure: LEFT AND RIGHT HEART CATHETERIZATION WITH CORONARY ANGIOGRAM;  Surgeon: Elsie GORMAN Somerset, MD;  Location: [facility] CATH LAB;  Service: Cardiovascular;  Laterality: N/A;   LESION EXCISION     basal cell removed right upper chest.   LESION EXCISION WITH COMPLEX REPAIR Right 08/17/2021   Procedure: LESION EXCISION OF  RIGHT LEG WITH COMPLEX REPAIR AND APPLICATION WITH A CELL;  Surgeon: Leora Lenis, MD;  Location: [facility];  Service: Plastics;  Laterality: Right;  LOCAL   NEPHROLITHOTOMY Right 05/28/2014   Procedure: NEPHROLITHOTOMY PERCUTANEOUS;  Surgeon: Garnette Shack, MD;  Location: [facility] ORS;  Service: Urology;  Laterality: Right;   TONSILLECTOMY     TOTAL HIP ARTHROPLASTY Right 12/19/2022   Procedure: RIGHT TOTAL HIP ARTHROPLASTY ANTERIOR APPROACH;  Surgeon: Sheril Coy, MD;  Location: [facility] ORS;  Service: Orthopedics;  Laterality: Right;    Social History    Socioeconomic History   Marital status: Married    Spouse name: Not on file   Number of children: Not on file   Years of education: Not on file   Highest education level: Not on file  Occupational History   Not on file  Tobacco Use   Smoking status: Former    Current packs/day: 0.00    Types: Cigarettes    Start date: 05/12/1969    Quit date: 05/13/1994    Years since quitting: 29.4    Passive exposure: Never   Smokeless tobacco: Never  Vaping Use   Vaping status: Never Used  Substance and Sexual Activity   Alcohol  use: Yes    Comment: occasional   Drug use: No   Sexual activity: Yes  Other Topics Concern   Not on file  Social History Narrative   ** Merged History Encounter **       Social Drivers of Health   Financial Resource Strain: Low Risk  (05/04/2023)   Overall Financial Resource Strain (CARDIA)    Difficulty of Paying Living Expenses: Not hard at all  Food Insecurity: No Food Insecurity (12/19/2022)   Hunger Vital Sign    Worried About Running Out of Food in the Last Year: Never true    Ran Out of Food in the Last Year: Never true  Transportation Needs: No Transportation Needs (12/19/2022)   PRAPARE - Administrator, Civil Service (Medical): No    Lack of Transportation (Non-Medical): No  Physical Activity: Sufficiently Active (05/04/2023)   Exercise Vital Sign    Days of Exercise per Week: 7 days    Minutes of Exercise per Session: 120 min  Stress: No Stress Concern Present (05/04/2023)   [employer] of Occupational Health - Occupational Stress Questionnaire    Feeling of Stress : Not at all  Social Connections: Socially Integrated (05/04/2023)   Social Connection and Isolation Panel    Frequency of Communication with Friends and Family: More than three times a week    Frequency of Social Gatherings with Friends and Family: Three times a week    Attends Religious Services: 1 to 4 times per year    Active Member of Clubs or Organizations:  Yes    Attends Banker Meetings: 1 to 4 times per year    Marital Status: Married  Catering manager Violence: Not At Risk (12/19/2022)   Humiliation, Afraid, Rape, and Kick questionnaire    Fear of Current or Ex-Partner: No    Emotionally Abused: No    Physically Abused: No    Sexually Abused: No    Family History  Problem Relation Age of Onset   Heart attack Father    Pancreatic cancer Father    Melanoma Brother     ROS: no fevers or chills, productive cough, hemoptysis, dysphasia, odynophagia, melena, hematochezia, dysuria, hematuria, rash, seizure activity, orthopnea, PND, pedal edema, claudication. Remaining systems are negative.  Physical Exam: Well-developed well-nourished in no acute distress.  Skin is warm and dry.  HEENT is normal.  Neck is supple.  Chest is clear to auscultation with normal expansion.  Cardiovascular exam is irregular Abdominal exam nontender or distended. No masses palpated. Extremities show no edema. neuro grossly intact  EKG Interpretation Date/Time:  Monday October 22 2023 08:15:29 EDT Ventricular Rate:  65 PR Interval:    QRS Duration:  76 QT Interval:  382 QTC Calculation: 397 R Axis:   3  Text Interpretation: Atrial fibrillation with premature ventricular or aberrantly conducted complexes Low voltage QRS Cannot rule out Septal infarct , age undetermined Confirmed by Pietro Rogue ([id number]) on 10/22/2023 8:22:43 AM    A/P  1 persistent atrial fibrillation-continue apixaban .  Heart rate is controlled on no medications.  Will plan repeat echocardiogram to reassess LV function.  Long discussion today concerning rate control versus rhythm control.  He is relatively asymptomatic but with his young age rhythm control may be the best option.  I will send him to the atrial fibrillation clinic for further evaluation (question antiarrhythmic versus ablation).  2 coronary artery disease-continue statin.  No chest pain.  3  hyperlipidemia-continue statin.  4 hypertension-blood pressure controlled.   5 obstructive sleep apnea-continue CPAP.  Rogue Pietro, MD

## 2023-10-12 DIAGNOSIS — Z23 Encounter for immunization: Secondary | ICD-10-CM | POA: Diagnosis not present

## 2023-10-22 ENCOUNTER — Ambulatory Visit: Attending: Cardiology | Admitting: Cardiology

## 2023-10-22 ENCOUNTER — Encounter: Payer: Self-pay | Admitting: Cardiology

## 2023-10-22 VITALS — BP 138/89 | HR 65 | Ht 74.0 in | Wt 262.2 lb

## 2023-10-22 DIAGNOSIS — I480 Paroxysmal atrial fibrillation: Secondary | ICD-10-CM | POA: Diagnosis not present

## 2023-10-22 DIAGNOSIS — I10 Essential (primary) hypertension: Secondary | ICD-10-CM

## 2023-10-22 DIAGNOSIS — I2510 Atherosclerotic heart disease of native coronary artery without angina pectoris: Secondary | ICD-10-CM

## 2023-10-22 DIAGNOSIS — E7800 Pure hypercholesterolemia, unspecified: Secondary | ICD-10-CM

## 2023-10-22 NOTE — Patient Instructions (Addendum)
 Medication Instructions:  Your physician recommends that you continue on your current medications as directed. Please refer to the Current Medication list given to you today.  *If you need a refill on your cardiac medications before your next appointment, please call your pharmacy*  Lab Work: NONE  If you have labs (blood work) drawn today and your tests are completely normal, you will receive your results only by: MyChart Message (if you have MyChart) OR A paper copy in the mail If you have any lab test that is abnormal or we need to change your treatment, we will call you to review the results.  Testing/Procedures: Your physician has requested that you have an echocardiogram. Echocardiography is a painless test that uses sound waves to create images of your heart. It provides your doctor with information about the size and shape of your heart and how well your heart's chambers and valves are working. This procedure takes approximately one hour. There are no restrictions for this procedure. Please do NOT wear cologne, perfume, aftershave, or lotions (deodorant is allowed). Please arrive 15 minutes prior to your appointment time.  Please note: We ask at that you not bring children with you during ultrasound (echo/ vascular) testing. Due to room size and safety concerns, children are not allowed in the ultrasound rooms during exams. Our front office staff cannot provide observation of children in our lobby area while testing is being conducted. An adult accompanying a patient to their appointment will only be allowed in the ultrasound room at the discretion of the ultrasound technician under special circumstances. We apologize for any inconvenience.   Your physician has referred you to the Atrial Fibrillation (Afib) Clinic.   Follow-Up: At [facility], you and your health needs are our priority.  As part of our continuing mission to provide you with exceptional heart care, our  providers are all part of one team.  This team includes your primary Cardiologist (physician) and Advanced Practice Providers or APPs (Physician Assistants and Nurse Practitioners) who all work together to provide you with the care you need, when you need it.  Your next appointment:   4-6  month(s)  Provider:   Redell Shallow, MD

## 2023-10-23 DIAGNOSIS — Z23 Encounter for immunization: Secondary | ICD-10-CM | POA: Diagnosis not present

## 2023-10-30 ENCOUNTER — Ambulatory Visit (HOSPITAL_COMMUNITY)
Admission: RE | Admit: 2023-10-30 | Discharge: 2023-10-30 | Disposition: A | Discharge status: Home / Self Care | Source: Ambulatory Visit | Attending: Physician Assistant | Admitting: Physician Assistant

## 2023-10-30 VITALS — BP 122/72 | HR 84 | Ht 74.0 in | Wt 265.4 lb

## 2023-10-30 DIAGNOSIS — D6869 Other thrombophilia: Secondary | ICD-10-CM | POA: Diagnosis not present

## 2023-10-30 DIAGNOSIS — I4819 Other persistent atrial fibrillation: Secondary | ICD-10-CM

## 2023-10-30 DIAGNOSIS — I4891 Unspecified atrial fibrillation: Secondary | ICD-10-CM

## 2023-10-30 NOTE — Progress Notes (Signed)
 Primary Care Physician: Verdia Lombard, MD Primary Cardiologist: Redell Shallow, MD Electrophysiologist: None  Referring Physician: Dr Shallow Wolm Pintos is a 68 y.o. male with a history of CAD s/p CABG, HLD, HTN, OSA, atrial fibrillation who presents for follow up in the [facility].  The patient was initially diagnosed with atrial fibrillation after his CABG. He was briefly on amiodarone  and BB post surgery but these were stopped. He noted recurrence on his smart Watch 05/2019. Patient is on Eliquis  for stroke prevention.    Patient presents today for follow up for atrial fibrillation. He remains in rate controlled afib today, unaware of his arrhythmia. No bleeding issues on anticoagulation. He uses his CPAP consistently. Does drink 3-4 alcoholic drinks weekly.   Today, he denies symptoms of palpitations, chest pain, shortness of breath, orthopnea, PND, lower extremity edema, dizziness, presyncope, syncope, bleeding, or neurologic sequela. The patient is tolerating medications without difficulties and is otherwise without complaint today.    Atrial Fibrillation Risk Factors:  he does have symptoms or diagnosis of sleep apnea. he does not have a history of rheumatic fever. he does have a history of alcohol  use. 3-4 drinks weekly The patient does have a history of early familial atrial fibrillation or other arrhythmias. Mother and father afib.  Atrial Fibrillation Management history:  Previous antiarrhythmic drugs: amiodarone  Previous cardioversions: none Previous ablations: none Anticoagulation history: Eliquis   ROS- All systems are reviewed and negative except as per the HPI above.  Past Medical History:  Diagnosis Date   Arthritis    CAD (coronary artery disease) 2015   CABGx6   Cancer (HCC)    melanoma on right leg,  Basal cell on right upper chest.   Dysrhythmia    a.fib   Gout    History of kidney stones    Kidney stones     Obesity (BMI 30-39.9)    PAF (paroxysmal atrial fibrillation) (HCC)    on Eliquis    S/P CABG x 6    Sleep apnea    uses CPAP nightly    Current Outpatient Medications  Medication Sig Dispense Refill   allopurinol  (ZYLOPRIM ) 100 MG tablet Take 100 mg by mouth daily.     Ascorbic Acid  (VITAMIN C ) 1000 MG tablet Take 1,000 mg by mouth daily.     atorvastatin  (LIPITOR) 80 MG tablet TAKE ONE TABLET BY MOUTH ONE TIME DAILY AT 06:00PM 90 tablet 3   ELIQUIS  5 MG TABS tablet TAKE 1 TABLET BY MOUTH TWICE A DAY 180 tablet 1   MELATONIN PO Take 4 mg by mouth at bedtime.     Multiple Vitamins-Minerals (CENTRUM SILVER PO) Take 1 tablet by mouth every morning.      Polyvinyl Alcohol -Povidone (REFRESH OP) Place 1 drop into both eyes 2 (two) times daily as needed (dry eyes).     tamsulosin  (FLOMAX ) 0.4 MG CAPS capsule Take 0.4 mg by mouth every morning.     No current facility-administered medications for this encounter.    Physical Exam: BP 122/72   Pulse 84   Ht 6' 2 (1.88 m)   Wt 120.4 kg   BMI 34.08 kg/m   GEN: Well nourished, well developed in no acute distress CARDIAC: Irregularly irregular rate and rhythm, no murmurs, rubs, gallops RESPIRATORY:  Clear to auscultation without rales, wheezing or rhonchi  ABDOMEN: Soft, non-tender, non-distended EXTREMITIES:  No edema; No deformity   Wt Readings from Last 3 Encounters:  10/30/23 120.4 kg  10/22/23  118.9 kg  07/31/23 117.9 kg     EKG today demonstrates  Afib, PVC Vent. rate 84 BPM PR interval * ms QRS duration 78 ms QT/QTcB 368/434 ms    CHA2DS2-VASc Score = 3  The patient's score is based upon: CHF History: 0 HTN History: 1 Diabetes History: 0 Stroke History: 0 Vascular Disease History: 1 Age Score: 1 Gender Score: 0       ASSESSMENT AND PLAN: Persistent Atrial Fibrillation (ICD10:  I48.19) The patient's CHA2DS2-VASc score is 3, indicating a 3.2% annual risk of stroke.   Patient in rate controlled afib today.   We discussed rhythm control options today including AAD (Multaq, dofetilide, amiodarone ) and ablation. Would avoid class IC with history of CAD. He would like to discuss ablation with EP, will refer.  Continue Eliquis  5 mg BID  Secondary Hypercoagulable State (ICD10:  D68.69) The patient is at significant risk for stroke/thromboembolism based upon his CHA2DS2-VASc Score of 3.  Continue Apixaban  (Eliquis ). No bleeding issues.   CAD S/p CABG No anginal symptoms Followed by Dr Pietro  HTN Stable on current regimen  OSA  Encouraged nightly CPAP   Follow up with EP to establish care and discuss ablation.     [facility] [address] [place], [place] [address] [phone]

## 2023-11-05 DIAGNOSIS — Z87442 Personal history of urinary calculi: Secondary | ICD-10-CM | POA: Diagnosis not present

## 2023-11-05 DIAGNOSIS — R3912 Poor urinary stream: Secondary | ICD-10-CM | POA: Diagnosis not present

## 2023-11-14 ENCOUNTER — Ambulatory Visit (HOSPITAL_COMMUNITY)
Admission: RE | Admit: 2023-11-14 | Discharge: 2023-11-14 | Disposition: A | Discharge status: Home / Self Care | Source: Ambulatory Visit | Attending: Cardiology | Admitting: Cardiology

## 2023-11-14 ENCOUNTER — Ambulatory Visit: Payer: Self-pay | Admitting: Cardiology

## 2023-11-14 DIAGNOSIS — I4891 Unspecified atrial fibrillation: Secondary | ICD-10-CM

## 2023-11-14 DIAGNOSIS — I2510 Atherosclerotic heart disease of native coronary artery without angina pectoris: Secondary | ICD-10-CM | POA: Diagnosis present

## 2023-11-14 DIAGNOSIS — E7800 Pure hypercholesterolemia, unspecified: Secondary | ICD-10-CM | POA: Diagnosis present

## 2023-11-14 DIAGNOSIS — I480 Paroxysmal atrial fibrillation: Secondary | ICD-10-CM | POA: Diagnosis present

## 2023-11-14 DIAGNOSIS — I10 Essential (primary) hypertension: Secondary | ICD-10-CM | POA: Diagnosis present

## 2023-11-14 LAB — ECHOCARDIOGRAM COMPLETE: S' Lateral: 3.4 cm

## 2023-11-14 MED ORDER — PERFLUTREN LIPID MICROSPHERE
1.0000 mL | INTRAVENOUS | Status: AC | PRN
  Administered 2023-11-14: 1 mL via INTRAVENOUS

## 2023-11-28 ENCOUNTER — Other Ambulatory Visit (HOSPITAL_COMMUNITY): Payer: Self-pay

## 2023-11-28 ENCOUNTER — Ambulatory Visit: Attending: Cardiovascular Disease | Admitting: Cardiovascular Disease

## 2023-11-28 VITALS — BP 137/71 | HR 65 | Resp 16 | Ht 74.0 in | Wt 270.6 lb

## 2023-11-28 DIAGNOSIS — I480 Paroxysmal atrial fibrillation: Secondary | ICD-10-CM | POA: Diagnosis not present

## 2023-11-28 DIAGNOSIS — I2510 Atherosclerotic heart disease of native coronary artery without angina pectoris: Secondary | ICD-10-CM | POA: Diagnosis not present

## 2023-11-28 DIAGNOSIS — Z01812 Encounter for preprocedural laboratory examination: Secondary | ICD-10-CM | POA: Diagnosis not present

## 2023-11-28 DIAGNOSIS — I10 Essential (primary) hypertension: Secondary | ICD-10-CM | POA: Diagnosis not present

## 2023-11-28 MED ORDER — PREDNISONE 50 MG PO TABS
ORAL_TABLET | ORAL | 0 refills | Status: DC
  Filled 2023-11-28: qty 3, 1d supply, fill #0

## 2023-11-28 NOTE — Progress Notes (Signed)
  Electrophysiology Office Note:    Date:  11/28/2023   ID:  Gary [name], DOB 1955-04-08, MRN [id number]  PCP:  Verdia Lombard, MD   [facility] Providers Cardiologist:  Redell Shallow, MD     Referring MD: Nellene Quita SAUNDERS, PA   History of Present Illness:    Gary [name] is a 68 y.o. male with a medical history significant for persistent atrial fibrillation, coronary artery disease status post CABG referred for arrhythmia management.      Discussed the use of AI scribe software for clinical note transcription with the patient, who gave verbal consent to proceed.  History of Present Illness  Atrial fibrillation was diagnosed after coronary artery bypass grafting in April 2015. He has experienced intermittent atrial fibrillation since then. In May 2021, his watch began detecting atrial fibrillation episodes multiple times daily. He remains asymptomatic during these episodes.    He is not currently on medication for management of atrial fibrillation. No palpitations, dizziness, or shortness of breath are present.         Today, he reports he feels well and has no complaints today.  EKGs/Labs/Other Studies Reviewed Today:     Echocardiogram:  TTE November 14, 2023 LVEF 50 to 55%.  Mildly reduced RV function.  Right atrium mildly dilated.  Normal left atrial size    EKG:   EKG Interpretation Date/Time:  Wednesday November 28 2023 10:13:13 EST Ventricular Rate:  65 PR Interval:    QRS Duration:  76 QT Interval:  388 QTC Calculation: 403 R Axis:   15  Text Interpretation: Atrial fibrillation with frequent Premature ventricular complexes Nonspecific T wave abnormality When compared with ECG of 30-Oct-2023 09:30, Current undetermined rhythm precludes rhythm comparison, needs review Nonspecific T wave abnormality has replaced inverted T waves in Inferior leads Nonspecific T wave abnormality, worse in Lateral leads  Confirmed by Nancey Scotts [phone])  on 11/28/2023 11:00:47 AM     Physical Exam:    VS:  BP 137/71 (BP Location: Left Arm, Patient Position: Sitting, Cuff Size: Large)   Pulse 65   Resp 16   Ht 6' 2 (1.88 m)   Wt 270 lb 9.6 oz (122.7 kg)   SpO2 98%   BMI 34.74 kg/m     Wt Readings from Last 3 Encounters:  11/28/23 270 lb 9.6 oz (122.7 kg)  10/30/23 265 lb 6.4 oz (120.4 kg)  10/22/23 262 lb 3.2 oz (118.9 kg)     GEN: Well nourished, well developed in no acute distress CARDIAC: iRRR, no murmurs, rubs, gallops RESPIRATORY:  Normal work of breathing MUSCULOSKELETAL: no edema    ASSESSMENT & PLAN:     Persistent atrial fibrillation Minimal symptoms We discussed the natural course of atrial fibrillation.  I explained the indication and rationale for rhythm control.  I explained risks including but not limited to: Access site bleeding, need for cardiac surgery, prolonged hospital stay, and the possibility of stroke heart attack or death which is very low but nonzero. He expressed understanding and would like to schedule the procedure. May use either Carto or Opal Will obtain preprocedural CT  Secondary hypercoagulable state CHA2DS2-VASc score is 3 Continue Eliquis  5 mg p.o. twice daily  CAD Status post CABG No anginal symptoms recently     Signed, Scotts FORBES Nancey, MD  11/28/2023 1:38 PM    [facility]

## 2023-11-28 NOTE — Patient Instructions (Addendum)
 Medication Instructions:  Your physician recommends that you continue on your current medications as directed. Please refer to the Current Medication list given to you today.  *If you need a refill on your cardiac medications before your next appointment, please call your pharmacy*  Testing/Procedures: Ablation Your physician has recommended that you have an ablation. Catheter ablation is a medical procedure used to treat some cardiac arrhythmias (irregular heartbeats). During catheter ablation, a long, thin, flexible tube is put into a blood vessel in your groin (upper thigh), or neck. This tube is called an ablation catheter. It is then guided to your heart through the blood vessel. Radio frequency waves destroy small areas of heart tissue where abnormal heartbeats may cause an arrhythmia to start.   You are scheduled for Atrial Fibrillation Ablation on Thursday, January 15 with Dr. Dr. Nancey. Please arrive at the Main Entrance A at [facility]: [address] [place], [place] [address] at 7:30 AM   What To Expect:  Labs: you will need to have lab work drawn within 30 days of your procedure. Please go to any [facility] location to have these drawn - no appointment is needed. Cardiac CT Scan: this will be done about 3-4 weeks prior to your procedure. You will be contacted to schedule this test. You will receive procedure instructions either through MyChart or in the mail 4-6 week prior to your procedure.  After your procedure we recommend no driving for 4 days, no lifting over 5 lbs for 7 days, and no work or strenuous activity for 7 days.  Please contact our office at [phone] if you have any questions.    Follow-Up: We will contact you to schedule your post-procedure appointments.  Your Cardiac CT has been scheduled on              at the following location:  [facility] [address]  [place], [place] [address]  Please enter the parking lot using  the [address] entrance and use the FREE valet service at the patient drop-off area. Enter the buidling and check-in with registration on the main floor.  Please follow these instructions carefully (unless otherwise directed):  An IV will be required for this test.  On the Night Before the Test: Be sure to Drink plenty of water . Do not consume any caffeinated/decaffeinated beverages or chocolate 12 hours prior to your test. Do not take any antihistamines 12 hours prior to your test.  Patient will need a prescription for Prednisone and very clear instructions (as follows): Prednisone 50 mg - take 13 hours prior to test Take another Prednisone 50 mg 7 hours prior to test Take another Prednisone 50 mg 1 hour prior to test Take Benadryl  50 mg 1 hour prior to test Patient must complete all four doses of above prophylactic medications. Patient will need a ride after test due to Benadryl .  On the Day of the Test: Drink plenty of water  until 1 hour prior to the test. Do not eat any food 1 hour prior to test. You may take your regular medications prior to the test.  If you take Furosemide /Hydrochlorothiazide/Spironolactone/Chlorthalidone, please HOLD on the morning of the test. Patients who wear a continuous glucose monitor MUST remove the device prior to scanning. FEMALES- please wear underwire-free bra if available, avoid dresses & tight clothing  After the Test: Drink plenty of water . After receiving IV contrast, you may experience a mild flushed feeling. This is normal. On occasion, you  may experience a mild rash up to 24 hours after the test. This is not dangerous. If this occurs, you can take Benadryl  25 mg, Zyrtec, Claritin, or Allegra and increase your fluid intake. (Patients taking Tikosyn should avoid Benadryl , and may take Zyrtec, Claritin, or Allegra) If you experience trouble breathing, this can be serious. If it is severe call 911 IMMEDIATELY. If it is mild, please call our  office.  For more information and frequently asked questions, please visit our website : [email or web address]  For non-scheduling related questions, please contact the cardiac imaging nurse navigator should you have any questions/concerns: Cardiac Imaging Nurse Navigators Direct Office Dial: [phone]   For scheduling needs, including cancellations and rescheduling, please call Brittany, [phone].

## 2023-12-21 ENCOUNTER — Telehealth: Payer: Self-pay

## 2023-12-21 NOTE — Telephone Encounter (Signed)
-----   Message from Nurse Aldona SAUNDERS, RN sent at 11/28/2023  6:01 PM EST ----- Regarding: 01/24/24 Afib Mealor Important: list procedure date as first item in subject line, followed by procedure type (e.g., 09/21/23 AFib ablation)  Precert:  MD: Mealor Type of ablation: A-fib Diagnosis: Afib CPT code: A-fib (06343) Ablation scheduled (date/time): 01/24/2023 930am  Procedure:  Added to calendar? Yes Orders entered? No, >30 days before procedure Letter complete? No, >30 days before procedure Scheduled with cath lab? Yes Any medications to hold? Routine Labs ordered (CBC, BMET, PT/INR if on warfarin): Yes Mapping system: Doesn't matter CARTO/OPAL rep notified? No Cardiac CT needed? Yes, ordered Dye allergy? Yes Pre-meds ordered and instructions given? Yes Letter method: MyChart H&P: 11/19 Device: No  Follow-up:  Cassie/Angel, please schedule Routine.  Covering RN - please send this message to [employer], EP scheduler, EP Scheduling pool, EP [employer], and CT scheduler (Brittany Lynch/Stephanie Mogg), if indicated.

## 2023-12-24 ENCOUNTER — Other Ambulatory Visit: Payer: Self-pay | Admitting: Cardiology

## 2023-12-24 DIAGNOSIS — I480 Paroxysmal atrial fibrillation: Secondary | ICD-10-CM

## 2023-12-24 NOTE — Telephone Encounter (Signed)
 Prescription refill request for Eliquis  received. Indication:afib Last office visit:11/25 Scr: 1.25  7/25 Age:68 Weight:122.7  kg  Prescription refilled

## 2023-12-26 ENCOUNTER — Telehealth (HOSPITAL_COMMUNITY): Payer: Self-pay | Admitting: *Deleted

## 2023-12-26 NOTE — Telephone Encounter (Signed)
 Reaching out to patient to offer assistance regarding upcoming cardiac imaging study; pt verbalizes understanding of appt date/time, parking situation and where to check in, pre-test NPO status and medications ordered, and verified current allergies; name and call back number provided for further questions should they arise  Chantal Requena RN Navigator Cardiac Imaging [facility] [phone] office [phone] cell  Reviewed how to take 13 hour prep. Patient verbalized understanding.

## 2023-12-27 ENCOUNTER — Ambulatory Visit (HOSPITAL_COMMUNITY)

## 2023-12-27 ENCOUNTER — Ambulatory Visit (HOSPITAL_COMMUNITY): Admission: RE | Admit: 2023-12-27 | Discharge: 2023-12-27 | Attending: Cardiovascular Disease

## 2023-12-27 DIAGNOSIS — I480 Paroxysmal atrial fibrillation: Secondary | ICD-10-CM | POA: Insufficient documentation

## 2023-12-27 MED ORDER — IOHEXOL 350 MG/ML SOLN
95.0000 mL | Freq: Once | INTRAVENOUS | Status: AC | PRN
  Administered 2023-12-27: 10:00:00 95 mL via INTRAVENOUS

## 2024-01-01 LAB — CBC

## 2024-01-02 LAB — BASIC METABOLIC PANEL WITH GFR
BUN/Creatinine Ratio: 16 (ref 10–24)
BUN: 20 mg/dL (ref 8–27)
CO2: 24 mmol/L (ref 20–29)
Calcium: 9.6 mg/dL (ref 8.6–10.2)
Chloride: 100 mmol/L (ref 96–106)
Creatinine, Ser: 1.23 mg/dL (ref 0.76–1.27)
Glucose: 106 mg/dL — ABNORMAL HIGH (ref 70–99)
Potassium: 4.8 mmol/L (ref 3.5–5.2)
Sodium: 140 mmol/L (ref 134–144)
eGFR: 65 mL/min/1.73

## 2024-01-02 LAB — CBC
Hematocrit: 48.5 % (ref 37.5–51.0)
Hemoglobin: 15.5 g/dL (ref 13.0–17.7)
MCH: 32.5 pg (ref 26.6–33.0)
MCHC: 32 g/dL (ref 31.5–35.7)
MCV: 102 fL — ABNORMAL HIGH (ref 79–97)
Platelets: 209 x10E3/uL (ref 150–450)
RBC: 4.77 x10E6/uL (ref 4.14–5.80)
RDW: 12.5 % (ref 11.6–15.4)
WBC: 6.5 x10E3/uL (ref 3.4–10.8)

## 2024-01-07 ENCOUNTER — Encounter (HOSPITAL_COMMUNITY): Payer: Self-pay

## 2024-01-07 ENCOUNTER — Ambulatory Visit: Payer: Self-pay | Admitting: Cardiovascular Disease

## 2024-01-07 ENCOUNTER — Telehealth (HOSPITAL_COMMUNITY): Payer: Self-pay

## 2024-01-07 DIAGNOSIS — I77810 Thoracic aortic ectasia: Secondary | ICD-10-CM

## 2024-01-07 DIAGNOSIS — I480 Paroxysmal atrial fibrillation: Secondary | ICD-10-CM

## 2024-01-07 NOTE — Telephone Encounter (Signed)
 Attempted to reach patient to discuss upcoming procedure, no answer. Left VM for patient to return call.

## 2024-01-08 NOTE — Telephone Encounter (Signed)
 Patient returned call to discuss upcoming procedure.   CT: completed.  Labs: completed.   Any recent signs of acute illness or been started on antibiotics? No Any new medications started?No Any medications to hold?  No Any missed doses of blood thinner?  No Advised patient to continue taking Eliquis  (Apixaban ) twice daily without missing any doses.  Medication instructions:  On the morning of your procedure DO NOT take any medication., including Eliquis  (Apixaban ) or the procedure may be rescheduled. Nothing to eat or drink after midnight prior to your procedure.  Confirmed patient is scheduled for Atrial Fibrillation Ablation on Thursday, January 15 with Dr. Nancey. Instructed patient to arrive at the Main Entrance A at [facility]: [address] [place], [place] [address] and check in at Admitting at 7:30 AM.   Plan to go home the same day, you will only stay overnight if medically necessary. You MUST have a responsible adult to drive you home and MUST be with you the first 24 hours after you arrive home or your procedure could be cancelled.  Informed patient a nurse will call a day before the procedure to confirm arrival time and ensure instructions are followed.  Patient verbalized understanding to all instructions provided and agreed to proceed with procedure.   Advised patient to contact RN Navigator at [phone], to inform of any new medications started after call or concerns prior to procedure.

## 2024-01-21 NOTE — Telephone Encounter (Addendum)
 Received a voice message from patient stated he has developed runny nose and a cough and inquired if will impact ablation on Jan 15.   Attempted to reach patient back, no answer. Left VM to return call.

## 2024-01-21 NOTE — Telephone Encounter (Signed)
 Patient reports over the past couple of days, he has had intermittent sneezing, runny nose, and a productive cough that is more noticed in the morning. He denies any fever. He feels like symptoms are resolving. Made patient aware, that guidelines are to postpone procedure for at least 4 weeks from upper respiratory illness diagnosis OR at least 2 weeks from the resolution of symptoms. Since ablation is this week, it may need to be delayed. Will forward to Dr. Mealor for final recommendation.

## 2024-01-22 NOTE — Addendum Note (Signed)
 Addended by: Margaret Cockerill on: 01/22/2024 11:51 AM   Modules accepted: Orders

## 2024-01-22 NOTE — Telephone Encounter (Signed)
 Pt's Afib Ablation with Dr. Nancey has been moved from 1/15 to 2/26 at 9:00 am. He will go to [facility] for updated labs.   I have updated Instruction letter and sent via MyChart.   Pt aware that Monalisa will call about 1 month prior to procedure to make sure he is feeling better and ok to move fwd with procedure.

## 2024-02-05 ENCOUNTER — Encounter (HOSPITAL_COMMUNITY): Payer: Self-pay

## 2024-02-05 ENCOUNTER — Telehealth (HOSPITAL_COMMUNITY): Payer: Self-pay

## 2024-02-05 NOTE — Telephone Encounter (Signed)
 Attempted to reach patient to discuss upcoming procedure, no answer. Left VM for patient to return call.

## 2024-02-05 NOTE — Telephone Encounter (Signed)
 Spoke with patient to discuss upcoming procedure.   CT: completed.  Labs: to be completed.   Any recent signs of acute illness or been started on antibiotics? Patient reports he has finally completely recovered from URI. Any new medications started since pre-op visit? No  Any missed doses of blood thinner?  No  Advised patient to continue taking Eliquis  (Apixaban ) twice daily without missing any doses. Any medications to hold?  routine Medication instructions:  On the morning of your procedure DO NOT take any medication., including Eliquis  (Apixaban ) or the procedure may be rescheduled. Nothing to eat or drink after midnight prior to your procedure.  Confirmed patient is scheduled for Atrial Fibrillation Ablation on Thursday, February 26 with Dr. Nancey. Instructed patient to arrive at the Main Entrance A at [facility]: [address] [place], [place] [address] and check in at Admitting at 7:00 AM.   Plan to go home the same day, you will only stay overnight if medically necessary. You MUST have a responsible adult to drive you home and MUST be with you the first 24 hours after you arrive home or your procedure could be cancelled.  Informed patient a nurse may call a day before the procedure to confirm arrival time and ensure instructions are followed.  Patient verbalized understanding to all instructions provided and agreed to proceed with procedure.   Advised patient to contact RN Navigator at [phone], to inform of any new medications started after call or concerns prior to procedure.

## 2024-02-09 LAB — BASIC METABOLIC PANEL WITH GFR
BUN/Creatinine Ratio: 17 (ref 10–24)
BUN: 21 mg/dL (ref 8–27)
CO2: 23 mmol/L (ref 20–29)
Calcium: 9.9 mg/dL (ref 8.6–10.2)
Chloride: 103 mmol/L (ref 96–106)
Creatinine, Ser: 1.21 mg/dL (ref 0.76–1.27)
Glucose: 97 mg/dL (ref 70–99)
Potassium: 4.8 mmol/L (ref 3.5–5.2)
Sodium: 142 mmol/L (ref 134–144)
eGFR: 66 mL/min/{1.73_m2}

## 2024-02-09 LAB — CBC
Hematocrit: 47.6 % (ref 37.5–51.0)
Hemoglobin: 15.7 g/dL (ref 13.0–17.7)
MCH: 33.3 pg — ABNORMAL HIGH (ref 26.6–33.0)
MCHC: 33 g/dL (ref 31.5–35.7)
MCV: 101 fL — ABNORMAL HIGH (ref 79–97)
Platelets: 186 10*3/uL (ref 150–450)
RBC: 4.71 x10E6/uL (ref 4.14–5.80)
RDW: 12.4 % (ref 11.6–15.4)
WBC: 6.2 10*3/uL (ref 3.4–10.8)

## 2024-02-14 ENCOUNTER — Ambulatory Visit: Payer: Self-pay

## 2024-02-20 ENCOUNTER — Ambulatory Visit (HOSPITAL_COMMUNITY): Admitting: Physician Assistant

## 2024-02-20 ENCOUNTER — Ambulatory Visit: Admitting: Cardiology

## 2024-02-21 ENCOUNTER — Ambulatory Visit (HOSPITAL_COMMUNITY): Admitting: Physician Assistant

## 2024-03-06 ENCOUNTER — Encounter (HOSPITAL_COMMUNITY): Admission: RE | Payer: Self-pay | Source: Home / Self Care

## 2024-03-06 ENCOUNTER — Ambulatory Visit (HOSPITAL_COMMUNITY): Admission: RE | Admit: 2024-03-06 | Source: Home / Self Care | Admitting: Cardiovascular Disease

## 2024-03-06 SURGERY — ATRIAL FIBRILLATION ABLATION
Anesthesia: General

## 2024-04-23 ENCOUNTER — Ambulatory Visit: Admitting: Cardiology

## 2025-01-05 ENCOUNTER — Other Ambulatory Visit (HOSPITAL_COMMUNITY)
